# Patient Record
Sex: Male | Born: 1955
Health system: Southern US, Community
[De-identification: ages and names within clinical notes are randomized; demographics above are authoritative.]

## PROBLEM LIST (undated history)

## (undated) DIAGNOSIS — Z9109 Other allergy status, other than to drugs and biological substances: Secondary | ICD-10-CM

## (undated) DIAGNOSIS — J45909 Unspecified asthma, uncomplicated: Secondary | ICD-10-CM

## (undated) DIAGNOSIS — T7840XA Allergy, unspecified, initial encounter: Secondary | ICD-10-CM

## (undated) DIAGNOSIS — G709 Myoneural disorder, unspecified: Secondary | ICD-10-CM

## (undated) DIAGNOSIS — E785 Hyperlipidemia, unspecified: Secondary | ICD-10-CM

## (undated) DIAGNOSIS — I10 Essential (primary) hypertension: Secondary | ICD-10-CM

## (undated) DIAGNOSIS — I319 Disease of pericardium, unspecified: Secondary | ICD-10-CM

## (undated) DIAGNOSIS — I2510 Atherosclerotic heart disease of native coronary artery without angina pectoris: Secondary | ICD-10-CM

## (undated) DIAGNOSIS — M1990 Unspecified osteoarthritis, unspecified site: Secondary | ICD-10-CM

## (undated) HISTORY — DX: Allergy, unspecified, initial encounter: T78.40XA

## (undated) HISTORY — DX: Hyperlipidemia, unspecified: E78.5

## (undated) HISTORY — DX: Unspecified osteoarthritis, unspecified site: M19.90

## (undated) HISTORY — PX: POLYPECTOMY: SHX149

## (undated) HISTORY — DX: Unspecified asthma, uncomplicated: J45.909

## (undated) HISTORY — DX: Disease of pericardium, unspecified: I31.9

## (undated) HISTORY — DX: Myoneural disorder, unspecified: G70.9

## (undated) HISTORY — PX: COLONOSCOPY: SHX174

## (undated) HISTORY — DX: Other allergy status, other than to drugs and biological substances: Z91.09

---

## 1998-07-28 ENCOUNTER — Emergency Department (HOSPITAL_COMMUNITY): Admission: EM | Admit: 1998-07-28 | Discharge: 1998-07-28 | Payer: Self-pay | Admitting: Emergency Medicine

## 1999-05-27 ENCOUNTER — Encounter: Payer: Self-pay | Admitting: Family Medicine

## 1999-05-27 ENCOUNTER — Encounter: Admission: RE | Admit: 1999-05-27 | Discharge: 1999-05-27 | Payer: Self-pay | Admitting: Family Medicine

## 2000-04-18 HISTORY — PX: KNEE ARTHROSCOPY: SUR90

## 2002-05-15 ENCOUNTER — Emergency Department (HOSPITAL_COMMUNITY): Admission: EM | Admit: 2002-05-15 | Discharge: 2002-05-15 | Payer: Self-pay | Admitting: Emergency Medicine

## 2002-05-15 ENCOUNTER — Encounter: Payer: Self-pay | Admitting: Emergency Medicine

## 2003-06-07 ENCOUNTER — Observation Stay (HOSPITAL_COMMUNITY): Admission: EM | Admit: 2003-06-07 | Discharge: 2003-06-08 | Payer: Self-pay | Admitting: Emergency Medicine

## 2003-06-14 ENCOUNTER — Emergency Department (HOSPITAL_COMMUNITY): Admission: EM | Admit: 2003-06-14 | Discharge: 2003-06-14 | Payer: Self-pay | Admitting: Emergency Medicine

## 2004-02-26 ENCOUNTER — Ambulatory Visit: Payer: Self-pay | Admitting: *Deleted

## 2004-03-09 ENCOUNTER — Ambulatory Visit: Payer: Self-pay | Admitting: Psychology

## 2004-04-07 ENCOUNTER — Ambulatory Visit: Payer: Self-pay | Admitting: Psychology

## 2004-04-09 ENCOUNTER — Ambulatory Visit: Payer: Self-pay | Admitting: *Deleted

## 2004-05-19 ENCOUNTER — Ambulatory Visit: Payer: Self-pay | Admitting: Psychology

## 2004-09-30 ENCOUNTER — Ambulatory Visit: Payer: Self-pay | Admitting: *Deleted

## 2005-07-14 ENCOUNTER — Ambulatory Visit: Payer: Self-pay | Admitting: Internal Medicine

## 2006-08-28 ENCOUNTER — Ambulatory Visit: Payer: Self-pay | Admitting: Gastroenterology

## 2006-10-02 ENCOUNTER — Ambulatory Visit: Payer: Self-pay | Admitting: Gastroenterology

## 2006-10-02 ENCOUNTER — Encounter: Payer: Self-pay | Admitting: Gastroenterology

## 2008-10-22 ENCOUNTER — Emergency Department (HOSPITAL_COMMUNITY): Admission: EM | Admit: 2008-10-22 | Discharge: 2008-10-22 | Payer: Self-pay | Admitting: Emergency Medicine

## 2009-10-21 ENCOUNTER — Ambulatory Visit (HOSPITAL_BASED_OUTPATIENT_CLINIC_OR_DEPARTMENT_OTHER): Admission: RE | Admit: 2009-10-21 | Discharge: 2009-10-21 | Payer: Self-pay | Admitting: Orthopedic Surgery

## 2009-10-21 DIAGNOSIS — M795 Residual foreign body in soft tissue: Secondary | ICD-10-CM

## 2009-12-17 DIAGNOSIS — L02519 Cutaneous abscess of unspecified hand: Secondary | ICD-10-CM

## 2009-12-17 DIAGNOSIS — L03119 Cellulitis of unspecified part of limb: Secondary | ICD-10-CM

## 2009-12-30 ENCOUNTER — Encounter (INDEPENDENT_AMBULATORY_CARE_PROVIDER_SITE_OTHER): Payer: Self-pay | Admitting: *Deleted

## 2010-01-12 ENCOUNTER — Ambulatory Visit: Payer: Self-pay | Admitting: Internal Medicine

## 2010-01-13 ENCOUNTER — Encounter: Payer: Self-pay | Admitting: Internal Medicine

## 2010-05-18 NOTE — Miscellaneous (Signed)
Summary: HIPAA Restrictions  HIPAA Restrictions   Imported By: Florinda Marker 01/13/2010 11:04:40  _____________________________________________________________________  External Attachment:    Type:   Image     Comment:   External Document

## 2010-05-18 NOTE — Consult Note (Signed)
Summary: New Pt. Referral: Dr. Cindee Salt  New Pt. Referral: Dr. Cindee Salt   Imported By: Florinda Marker 01/18/2010 16:42:07  _____________________________________________________________________  External Attachment:    Type:   Image     Comment:   External Document

## 2010-05-18 NOTE — Assessment & Plan Note (Signed)
Summary: new pt cellulitis,abscess  Current Problems:   RESIDUAL FOREIGN BODY IN SOFT TISSUE (ICD-729.6)   CELLULITIS, HAND, LEFT (ICD-682.4)   * I&D, SPLINTER REMOVAL 10/21/09   * MYCOBACTERIUM AVIUM WOUND INFECTION    CC:  July had splinter in left little finger.  History of Present Illness: [name] is a 55 year old who was referred by Dr. Cindee Salt for evaluation of a left hand infection. He was working in his garden on July 5 of this year. He tried to dry final coated metal tomato stake into the ground.  Apparently it was quite rusty and snapped and a half causing him to fall forward and hit his hand on a wooden state laying on the ground.  As soon as this happened he realized that he had a splinter on the dorsal aspect of his left little finger near the knuckle.  He saw his primary care doctor that evening and was put on Augmentin.he was referred to Dr. Merlyn Lot who saw him the following morning and removed a 1.5-cm wooden splinter along with some pus under local anesthesia.  He took the Augmentin for about one more week and has healed nicely since that time.  Operative cultures eventually grew Mycobacterium avium and he was referred here.  He has not had any fever, chills, or sweats.  He is not any cough or shortness of breath.  He did develop some right lower back pain and mild shortness of breath around the time of his injury.  This was after a long car ride to see he saw his primary care doctor, Dr. Felipa Eth. Apparently a chest x-ray revealed a question of a small right sided pneumothorax and a chest CT scan was done and he tells me it was completely normal.  Preventive Screening-Counseling & Management  Alcohol-Tobacco     Alcohol drinks/day: 1     Alcohol type: wine, beer     Smoking Status: never  Caffeine-Diet-Exercise     Caffeine use/day: yes     Does Patient Exercise: yes     Type of exercise: bike ridding     Exercise (avg: min/session): weekends   Prior Medication List:   No prior medications documented  Current Allergies (reviewed today): ! MORPHINE Vital Signs:  Patient profile:   55 year old male Height:      71 inches (180.34 cm) Weight:      182.25 pounds (82.84 kg) BMI:     25.51 Temp:     98.1 degrees F (36.72 degrees C) oral Pulse rate:   67 / minute BP sitting:   137 / 87  (left arm) Cuff size:   large  Vitals Entered By: Jennet Maduro RN (January 12, 2010 1:56 PM) CC: July had splinter in left little finger Is Patient Diabetic? No Pain Assessment Patient in pain? no      Nutritional Status BMI of 25 - 29 = overweight Nutritional Status Detail appetite "good"  Have you ever been in a relationship where you felt threatened, hurt or afraid?not asked today   Does patient need assistance? Functional Status Self care Ambulation Normal   Physical Exam  General:  alert and well-nourished.   Lungs:  normal breath sounds, no crackles, and no wheezes.   Extremities:  there is a nicely healing incision at the base of his left little finger dorsally.  There is no cellulitis, drainage or other signs of active infection. Axillary Nodes:  no R axillary adenopathy and no L axillary adenopathy.  Impression & Recommendations:  Problem # 1:  CELLULITIS, HAND, LEFT (ICD-682.4) He had an acute mycobacterium avium infection of his left little finger caused by the foreign body but it has healed completely with removal of the foreign body.  I see no indication for further antimicrobial therapy at this time. I asked him to call me if he has any signs of inflammation or other concerns in the coming months. Orders: Consultation Level III (52841)  Patient Instructions: 1)  Please schedule a follow-up appointment as needed.

## 2010-05-18 NOTE — Miscellaneous (Signed)
Summary: Problems and allergies  Clinical Lists Changes Phone call to Dr. Merrilee Seashore office.  Message left requesting Susceptibilities to be ordered on the MAI culture results from 10/22/09.  Jennet Maduro RN  December 30, 2009 12:38 PM  Problems: Added new problem of CELLULITIS, HAND, LEFT (ICD-682.4) Added new problem of RESIDUAL FOREIGN BODY IN SOFT TISSUE (ICD-729.6) - left little finger Allergies: Added new allergy or adverse reaction of MORPHINE Observations: Added new observation of NKA: F (12/30/2009 12:22)

## 2010-07-04 LAB — AFB CULTURE WITH SMEAR (NOT AT ARMC): Acid Fast Smear: NONE SEEN

## 2010-07-04 LAB — WOUND CULTURE
Culture: NO GROWTH
Gram Stain: NONE SEEN

## 2010-07-04 LAB — ANAEROBIC CULTURE

## 2010-09-03 NOTE — Discharge Summary (Signed)
NAME:  Roger Wilson, Roger Wilson                             ACCOUNT NO.:  [id number]   MEDICAL RECORD NO.:  [id number]                   PATIENT TYPE:  INP   LOCATION:  3731                                 FACILITY:  [facility]   PHYSICIAN:  Guy Franco, P.A. [facility]                DATE OF BIRTH:  11/06/1955   DATE OF ADMISSION:  06/07/2003  DATE OF DISCHARGE:  06/08/2003                           DISCHARGE SUMMARY - REFERRING   DISCHARGE DIAGNOSES:  1. Syncope.  2. Hypertension.  3. History of pericarditis.  4. Bradycardia.   HISTORY OF PRESENT ILLNESS:  Roger Wilson is a 55 year old male patient who has  a history of pericarditis on two separate occasions and was treated by Dr.  Glennon Hamilton.  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  While at  [employer] yesterday he was reaching for a container and passed out.  He was  out approximately 20 seconds.  He did not lose bowel or bladder function.  No evidence of shaking by witness.   HOSPITAL COURSE:  He was brought to the emergency room for evaluation.  Cardiac isoenzymes were negative.  Head CT revealed atrophy with no acute  changes.  A chest x-ray revealed probable COPD.  He was monitored overnight.  Vital signs remained stable with a blood pressure of 108/57.   DISPOSITION:  At this point we are going to allow him to go home, and we  will plan for a treadmill Cardiolite, carotid ultrasounds, and we will check  upper extremity arterial blood flow to check for discrepancies.  At this  point, if these are negative, we will consider a tilt table and Holter  monitor.  We will have him follow up with Dr. Glennon Hamilton in one to two  weeks.  We will have the office contact him with the above appointment.   DISCHARGE MEDICATIONS:  His medications essentially are unchanged.  Apparently he was only taking Naprosyn prior to admission.                                                Guy Franco, P.A. [facility]    LB/MEDQ  D:  06/08/2003  T:  06/08/2003  Job:   [id number]

## 2010-09-03 NOTE — H&P (Signed)
NAME:  Roger Wilson, Roger Wilson                             ACCOUNT NO.:  [id number]   MEDICAL RECORD NO.:  [id number]                   PATIENT TYPE:  INP   LOCATION:  3731                                 FACILITY:  [facility]   PHYSICIAN:  Leonard Downing. Pernell Dupre, M.D.               DATE OF BIRTH:  [birth year]   DATE OF ADMISSION:  06/07/2003  DATE OF DISCHARGE:                                HISTORY & PHYSICAL   PRIMARY CARE PHYSICIAN:  Dr. Hulan Saas.   PRIMARY CARDIOLOGIST:  Adolph Pollack.   CHIEF COMPLAINT:  Passed out.   HISTORY OF PRESENT ILLNESS:  This is a 55 year old white male with a history  of syncope x1 while giving blood.  Pericarditis x2.  Presents complaining of  passing out.  The patient states he went to [place] [employer] at [employer] [place].[employer]., walking  down an aisle feeling acute light headed and nauseated, resulting in  syncope.  The patient denies chest pain, shortness of breath, no visual or  olfactory aura.  The patient reached up to grab a container and passed out.  The patient was out for about 20 seconds, no loss of bowel or bladder  function.  No shaking.  The patient states he did hit his head and cut his  left elbow.  The patient states he ate breakfast, and yogurt for lunch.  No  fevers, chills, night sweats or palpitations.  The patient's only complaint  is the back of his right hand occasionally feeling numb since ACL surgery  approximately 5 weeks ago.  No problems with bowel or bladder function.  No  blood in either.  No paroxysmal nocturnal dyspnea, orthopnea, or lower  extremity edema.  The patient went to an outside Urgent Care Center and was  sent to [facility].   ALLERGIES:  MORPHINE causes a rash, his veins turn red.   MEDICATIONS:  Naprosyn from his recent ACL surgery.   PAST MEDICAL HISTORY:  1. Syncope x1 in giving blood.  2. Pericarditis x2, viral induced, treated with NSAID.  Admitted once in the     care of  Adolph Pollack.  The first episode was 17 years ago, the second  episode was 9  years ago.  1. ACL surgery approximately 5 weeks ago on his left knee.   SOCIAL HISTORY:  He lives in [place].  He lives alone, and with a 16-year-  old daughter.  Occupation is an Lobbyist.  Does note tobacco,  occasions alcohol.  No drugs.  Exercises in regards to rehabilitation for  his ACL.  He watches his diet.  No herbal medicines.   FAMILY HISTORY:  Mother is alive at 50 with hypertension, diabetes and  obesity.  Father is alive at 46 and healthy.  Siblings - 5 brothers and  sisters healthy except for 1 brother with liver cancer.   REVIEW OF SYSTEMS:  Syncope and  numbness of the right hand occasionally.   PHYSICAL EXAMINATION:  VITAL SIGNS:  Temperature afebrile at 98, pulse 59,  respiratory rate is 20.  Blood pressure 164/102.  Saturations 100% on room  air.  GENERAL:  Alert and oriented x3, in no apparent distress.  HEENT:  Pupils are equal, round and reactive to light.  Extraocular  movements are intact.  NECK:  No carotid bruits.  JVP approximately one fourth the way up his jaw,  no lymphadenopathy.  CARDIOVASCULAR:  Normal S1, S2 with a positive S4, no murmurs, rubs, or  gallops.  LUNGS:  Clear to auscultation bilaterally.  SKIN:  No rashes or lesions.  ABDOMEN:  Soft, nontender, nondistended but does have a positive  hepatojugular reflux.  No abdominal bruits.  GU:  Not done.  RECTAL:  Deferred.  EXTREMITIES:  No clubbing or cyanosis, trace edema in his left knee.  Strong  radial and pedal pulses.  No spine or CVA tenderness.  NEUROLOGIC:  Alert and oriented x3.  Cranial nerves II-XII intact.   Chest x-ray is pending.  EKG:  Rate of 55, sinus rhythm, axis negative 7,  intervals:  PR 150, QRS 83, QTC of 427, no Q waves. J-point elevation V1 to  V3, nonspecific T wave inversions V4 through V6 and 3 in aVL.  CT of the  head is pending.   LABORATORY DATA:  CBC shows a white blood cell count of 6500, H&H 15/43,  platelet count of 209,000,  has a normal differential.  ABG:  pH 7.47, PCO2  of 37, PAO2 of 100, bicarb of 27, saturation 98% on room air.  The rest of  his labs are pending.   PLAN:  1. Syncope.  Will admit to telemetry, order the head CT without contrast     considering her fall.  Will check a chem-7, calcium, magnesium and     phosphorus.  CBC as above.  Urinalysis, urine toxicity screen, liver     panel, TSH, free T4, and chest x-ray.  Unsure of the etiology of his     syncope.  The patient has a previous history of syncope secondary to a     trigger, no obvious triggers at [facility].  2. Hypertension.  The patient states he has a history of white coat     syndrome, will see if normalizes, if does not will place on     antihypertensives.  3. Health maintenance.  Check a liver panel in the a.m.  4. He is full code.                                                Greggory Stallion L. Pernell Dupre, M.D.    Loralie Champagne  D:  06/07/2003  T:  06/08/2003  Job:  [id number]

## 2011-02-16 ENCOUNTER — Other Ambulatory Visit: Payer: Self-pay | Admitting: Dermatology

## 2011-07-19 ENCOUNTER — Other Ambulatory Visit: Payer: Self-pay | Admitting: Specialist

## 2011-07-19 DIAGNOSIS — M25572 Pain in left ankle and joints of left foot: Secondary | ICD-10-CM

## 2011-07-20 ENCOUNTER — Ambulatory Visit
Admission: RE | Admit: 2011-07-20 | Discharge: 2011-07-20 | Disposition: A | Discharge status: Home / Self Care | Payer: BC Managed Care – PPO | Source: Ambulatory Visit | Attending: Specialist | Admitting: Specialist

## 2011-07-20 DIAGNOSIS — M25572 Pain in left ankle and joints of left foot: Secondary | ICD-10-CM

## 2011-07-27 ENCOUNTER — Encounter: Payer: Self-pay | Admitting: Gastroenterology

## 2011-12-21 ENCOUNTER — Other Ambulatory Visit: Payer: Self-pay | Admitting: Internal Medicine

## 2011-12-21 ENCOUNTER — Ambulatory Visit
Admission: RE | Admit: 2011-12-21 | Discharge: 2011-12-21 | Disposition: A | Discharge status: Home / Self Care | Payer: BC Managed Care – PPO | Source: Ambulatory Visit | Attending: Internal Medicine | Admitting: Internal Medicine

## 2011-12-21 DIAGNOSIS — R10819 Abdominal tenderness, unspecified site: Secondary | ICD-10-CM

## 2011-12-21 MED ORDER — IOHEXOL 300 MG/ML  SOLN
100.0000 mL | Freq: Once | INTRAMUSCULAR | Status: AC | PRN
  Administered 2011-12-21: 100 mL via INTRAVENOUS

## 2012-01-30 ENCOUNTER — Encounter: Payer: Self-pay | Admitting: Gastroenterology

## 2012-03-12 ENCOUNTER — Ambulatory Visit (AMBULATORY_SURGERY_CENTER): Payer: BC Managed Care – PPO | Admitting: *Deleted

## 2012-03-12 VITALS — Ht 71.0 in | Wt 183.8 lb

## 2012-03-12 DIAGNOSIS — Z1211 Encounter for screening for malignant neoplasm of colon: Secondary | ICD-10-CM

## 2012-03-12 MED ORDER — MOVIPREP 100 G PO SOLR: ORAL | Status: DC

## 2012-03-30 ENCOUNTER — Encounter: Payer: Self-pay | Admitting: Gastroenterology

## 2012-03-30 ENCOUNTER — Ambulatory Visit (AMBULATORY_SURGERY_CENTER): Payer: BC Managed Care – PPO | Admitting: Gastroenterology

## 2012-03-30 VITALS — BP 131/100 | HR 48 | Temp 96.5°F | Resp 30 | Ht 71.0 in | Wt 183.0 lb

## 2012-03-30 DIAGNOSIS — D126 Benign neoplasm of colon, unspecified: Secondary | ICD-10-CM

## 2012-03-30 DIAGNOSIS — Z860100 Personal history of colon polyps, unspecified: Secondary | ICD-10-CM

## 2012-03-30 DIAGNOSIS — K5730 Diverticulosis of large intestine without perforation or abscess without bleeding: Secondary | ICD-10-CM

## 2012-03-30 DIAGNOSIS — Z1211 Encounter for screening for malignant neoplasm of colon: Secondary | ICD-10-CM

## 2012-03-30 MED ORDER — SODIUM CHLORIDE 0.9 % IV SOLN: 500.0000 mL | INTRAVENOUS | Status: DC

## 2012-03-30 NOTE — Patient Instructions (Addendum)
Discharge instruction given with verbal understanding. Handouts on polyps and diverticulosis given. Resume previous medications. YOU HAD AN ENDOSCOPIC PROCEDURE TODAY AT THE [facility]: Refer to the procedure report that was given to you for any specific questions about what was found during the examination.  If the procedure report does not answer your questions, please call your gastroenterologist to clarify.  If you requested that your care partner not be given the details of your procedure findings, then the procedure report has been included in a sealed envelope for you to review at your convenience later.  YOU SHOULD EXPECT: Some feelings of bloating in the abdomen. Passage of more gas than usual.  Walking can help get rid of the air that was put into your GI tract during the procedure and reduce the bloating. If you had a lower endoscopy (such as a colonoscopy or flexible sigmoidoscopy) you may notice spotting of blood in your stool or on the toilet paper. If you underwent a bowel prep for your procedure, then you may not have a normal bowel movement for a few days.  DIET: Your first meal following the procedure should be a light meal and then it is ok to progress to your normal diet.  A half-sandwich or bowl of soup is an example of a good first meal.  Heavy or fried foods are harder to digest and may make you feel nauseous or bloated.  Likewise meals heavy in dairy and vegetables can cause extra gas to form and this can also increase the bloating.  Drink plenty of fluids but you should avoid alcoholic beverages for 24 hours.  ACTIVITY: Your care partner should take you home directly after the procedure.  You should plan to take it easy, moving slowly for the rest of the day.  You can resume normal activity the day after the procedure however you should NOT DRIVE or use heavy machinery for 24 hours (because of the sedation medicines used during the test).    SYMPTOMS TO REPORT  IMMEDIATELY: A gastroenterologist can be reached at any hour.  During normal business hours, 8:30 AM to 5:00 PM Monday through Friday, call [phone].  After hours and on weekends, please call the GI answering service at [phone] who will take a message and have the physician on call contact you.   Following lower endoscopy (colonoscopy or flexible sigmoidoscopy):  Excessive amounts of blood in the stool  Significant tenderness or worsening of abdominal pains  Swelling of the abdomen that is new, acute  Fever of 100F or higher  FOLLOW UP: If any biopsies were taken you will be contacted by phone or by letter within the next 1-3 weeks.  Call your gastroenterologist if you have not heard about the biopsies in 3 weeks.  Our staff will call the home number listed on your records the next business day following your procedure to check on you and address any questions or concerns that you may have at that time regarding the information given to you following your procedure. This is a courtesy call and so if there is no answer at the home number and we have not heard from you through the emergency physician on call, we will assume that you have returned to your regular daily activities without incident.  SIGNATURES/CONFIDENTIALITY: You and/or your care partner have signed paperwork which will be entered into your electronic medical record.  These signatures attest to the fact that that the information above on your After Visit  Summary has been reviewed and is understood.  Full responsibility of the confidentiality of this discharge information lies with you and/or your care-partner.  

## 2012-03-30 NOTE — Progress Notes (Signed)
Patient did not experience any of the following events: a burn prior to discharge; a fall within the facility; wrong site/side/patient/procedure/implant event; or a hospital transfer or hospital admission upon discharge from the facility. (G8907) Patient did not have preoperative order for IV antibiotic SSI prophylaxis. (G8918)  

## 2012-03-30 NOTE — Op Note (Signed)
 [facility] [address] [place], [address]   COLONOSCOPY PROCEDURE REPORT  PATIENT: Roger Wilson, Roger Wilson  MR#: [id number] BIRTHDATE: 1955/12/11 , 56  yrs. old GENDER: Male ENDOSCOPIST: Rachael Fee, MD PROCEDURE DATE:  03/30/2012 PROCEDURE:   Colonoscopy with snare polypectomy ASA CLASS:   Class II INDICATIONS:small adenoma removed 2008. MEDICATIONS: Fentanyl 75 mcg IV, Versed 4 mg IV, and These medications were titrated to patient response per physician's verbal order  DESCRIPTION OF PROCEDURE:   After the risks benefits and alternatives of the procedure were thoroughly explained, informed consent was obtained.  A digital rectal exam revealed no abnormalities of the rectum.   The LB [id number]  endoscope was introduced through the anus and advanced to the cecum, which was identified by both the appendix and ileocecal valve. No adverse events experienced.   The quality of the prep was good, using MoviPrep  The instrument was then slowly withdrawn as the colon was fully examined.  COLON FINDINGS: Two small polyps were removed and sent to pathology. These were 2mm across each, both located in cecum, both removed with cold snare and sent to pathlogy.  There was mild left colon diverticulosis.  The examination was otherwise normal.  Retroflexed views revealed no abnormalities. The time to cecum=4 minutes 02 seconds.  Withdrawal time=10 minutes 48 seconds.  The scope was withdrawn and the procedure completed. COMPLICATIONS: There were no complications.  ENDOSCOPIC IMPRESSION: Two small polyps were removed and sent to pathology. There was mild left colon diverticulosis. The examination was otherwise normal.  RECOMMENDATIONS: If the polyp(s) removed today are proven to be adenomatous (pre-cancerous) polyps, you will need a repeat colonoscopy in 5 years.  Otherwise you should continue to follow colorectal cancer screening guidelines for "routine risk"  patients with colonoscopy in 10 years.  You will receive a letter within 1-2 weeks with the results of your biopsy as well as final recommendations.  Please call my office if you have not received a letter after 3 weeks.   eSigned:  Rachael Fee, MD 03/30/2012 8:58 AM

## 2012-04-02 ENCOUNTER — Telehealth: Payer: Self-pay | Admitting: *Deleted

## 2012-04-02 NOTE — Telephone Encounter (Signed)
  Follow up Call-  Call back number 03/30/2012  Post procedure Call Back phone  # [phone]  Permission to leave phone message Yes     Patient questions:  Do you have a fever, pain , or abdominal swelling? no Pain Score  0 *  Have you tolerated food without any problems? yes  Have you been able to return to your normal activities? yes  Do you have any questions about your discharge instructions: Diet   no Medications  no Follow up visit  no  Do you have questions or concerns about your Care? no  Actions: * If pain score is 4 or above: No action needed, pain <4.

## 2012-04-09 ENCOUNTER — Encounter: Payer: Self-pay | Admitting: Gastroenterology

## 2014-03-04 ENCOUNTER — Observation Stay (HOSPITAL_COMMUNITY): Payer: BC Managed Care – PPO

## 2014-03-04 ENCOUNTER — Inpatient Hospital Stay (HOSPITAL_COMMUNITY)
Admission: EM | Admit: 2014-03-04 | Discharge: 2014-03-08 | DRG: 965 | Disposition: A | Discharge status: Rehabilitation Facility | Payer: BC Managed Care – PPO | Attending: Surgery | Admitting: Surgery

## 2014-03-04 ENCOUNTER — Emergency Department (HOSPITAL_COMMUNITY): Payer: BC Managed Care – PPO

## 2014-03-04 ENCOUNTER — Encounter (HOSPITAL_COMMUNITY): Payer: Self-pay | Admitting: Emergency Medicine

## 2014-03-04 DIAGNOSIS — S32302A Unspecified fracture of left ilium, initial encounter for closed fracture: Secondary | ICD-10-CM | POA: Diagnosis present

## 2014-03-04 DIAGNOSIS — W132XXA Fall from, out of or through roof, initial encounter: Secondary | ICD-10-CM | POA: Diagnosis present

## 2014-03-04 DIAGNOSIS — S0003XA Contusion of scalp, initial encounter: Secondary | ICD-10-CM | POA: Diagnosis present

## 2014-03-04 DIAGNOSIS — S329XXA Fracture of unspecified parts of lumbosacral spine and pelvis, initial encounter for closed fracture: Secondary | ICD-10-CM

## 2014-03-04 DIAGNOSIS — I10 Essential (primary) hypertension: Secondary | ICD-10-CM | POA: Diagnosis present

## 2014-03-04 DIAGNOSIS — S42002A Fracture of unspecified part of left clavicle, initial encounter for closed fracture: Secondary | ICD-10-CM | POA: Diagnosis present

## 2014-03-04 DIAGNOSIS — S270XXA Traumatic pneumothorax, initial encounter: Secondary | ICD-10-CM | POA: Diagnosis present

## 2014-03-04 DIAGNOSIS — J939 Pneumothorax, unspecified: Secondary | ICD-10-CM

## 2014-03-04 DIAGNOSIS — S36892A Contusion of other intra-abdominal organs, initial encounter: Secondary | ICD-10-CM | POA: Diagnosis present

## 2014-03-04 DIAGNOSIS — S2232XA Fracture of one rib, left side, initial encounter for closed fracture: Principal | ICD-10-CM | POA: Diagnosis present

## 2014-03-04 DIAGNOSIS — J309 Allergic rhinitis, unspecified: Secondary | ICD-10-CM | POA: Diagnosis present

## 2014-03-04 DIAGNOSIS — R338 Other retention of urine: Secondary | ICD-10-CM | POA: Diagnosis present

## 2014-03-04 DIAGNOSIS — W19XXXA Unspecified fall, initial encounter: Secondary | ICD-10-CM

## 2014-03-04 DIAGNOSIS — Z885 Allergy status to narcotic agent status: Secondary | ICD-10-CM

## 2014-03-04 DIAGNOSIS — S3282XA Multiple fractures of pelvis without disruption of pelvic ring, initial encounter for closed fracture: Secondary | ICD-10-CM | POA: Diagnosis present

## 2014-03-04 DIAGNOSIS — S32592A Other specified fracture of left pubis, initial encounter for closed fracture: Secondary | ICD-10-CM | POA: Diagnosis present

## 2014-03-04 DIAGNOSIS — M25511 Pain in right shoulder: Secondary | ICD-10-CM | POA: Diagnosis present

## 2014-03-04 HISTORY — DX: Essential (primary) hypertension: I10

## 2014-03-04 LAB — I-STAT CHEM 8, ED
BUN: 12 mg/dL (ref 6–23)
CALCIUM ION: 1.14 mmol/L (ref 1.13–1.30)
Chloride: 102 mEq/L (ref 96–112)
Creatinine, Ser: 0.9 mg/dL (ref 0.50–1.35)
GLUCOSE: 129 mg/dL — AB (ref 70–99)
HCT: 43 % (ref 39.0–52.0)
Hemoglobin: 14.6 g/dL (ref 13.0–17.0)
Potassium: 4.1 mEq/L (ref 3.7–5.3)
Sodium: 141 mEq/L (ref 137–147)
TCO2: 28 mmol/L (ref 0–100)

## 2014-03-04 LAB — CBC WITH DIFFERENTIAL/PLATELET
Basophils Absolute: 0 10*3/uL (ref 0.0–0.1)
Basophils Relative: 0 % (ref 0–1)
EOS ABS: 0.1 10*3/uL (ref 0.0–0.7)
Eosinophils Relative: 1 % (ref 0–5)
HCT: 41 % (ref 39.0–52.0)
Hemoglobin: 14.6 g/dL (ref 13.0–17.0)
LYMPHS ABS: 0.8 10*3/uL (ref 0.7–4.0)
LYMPHS PCT: 8 % — AB (ref 12–46)
MCH: 30.6 pg (ref 26.0–34.0)
MCHC: 35.6 g/dL (ref 30.0–36.0)
MCV: 86 fL (ref 78.0–100.0)
Monocytes Absolute: 0.5 10*3/uL (ref 0.1–1.0)
Monocytes Relative: 5 % (ref 3–12)
Neutro Abs: 7.9 10*3/uL — ABNORMAL HIGH (ref 1.7–7.7)
Neutrophils Relative %: 86 % — ABNORMAL HIGH (ref 43–77)
PLATELETS: 129 10*3/uL — AB (ref 150–400)
RBC: 4.77 MIL/uL (ref 4.22–5.81)
RDW: 12.3 % (ref 11.5–15.5)
WBC: 9.3 10*3/uL (ref 4.0–10.5)

## 2014-03-04 LAB — COMPREHENSIVE METABOLIC PANEL
ALK PHOS: 44 U/L (ref 39–117)
ALT: 32 U/L (ref 0–53)
ANION GAP: 8 (ref 5–15)
AST: 41 U/L — ABNORMAL HIGH (ref 0–37)
Albumin: 3.4 g/dL — ABNORMAL LOW (ref 3.5–5.2)
BILIRUBIN TOTAL: 0.6 mg/dL (ref 0.3–1.2)
BUN: 12 mg/dL (ref 6–23)
CHLORIDE: 103 meq/L (ref 96–112)
CO2: 27 mEq/L (ref 19–32)
Calcium: 8.3 mg/dL — ABNORMAL LOW (ref 8.4–10.5)
Creatinine, Ser: 0.86 mg/dL (ref 0.50–1.35)
GFR calc non Af Amer: >90 mL/min (ref >90)
GLUCOSE: 123 mg/dL — AB (ref 70–99)
POTASSIUM: 4.2 meq/L (ref 3.7–5.3)
Sodium: 138 mEq/L (ref 137–147)
Total Protein: 6.3 g/dL (ref 6.0–8.3)

## 2014-03-04 LAB — ABO/RH: ABO/RH(D): O POS

## 2014-03-04 LAB — TYPE AND SCREEN
ABO/RH(D): O POS
Antibody Screen: NEGATIVE

## 2014-03-04 LAB — MRSA PCR SCREENING: MRSA by PCR: NEGATIVE

## 2014-03-04 MED ORDER — HYDROMORPHONE HCL 1 MG/ML IJ SOLN
1.0000 mg | Freq: Once | INTRAMUSCULAR | Status: AC
  Administered 2014-03-04: 1 mg via INTRAVENOUS
  Filled 2014-03-04: qty 1

## 2014-03-04 MED ORDER — SODIUM CHLORIDE 0.9 % IJ SOLN: 9.0000 mL | INTRAMUSCULAR | Status: DC | PRN

## 2014-03-04 MED ORDER — SODIUM CHLORIDE 0.9 % IV BOLUS (SEPSIS)
500.0000 mL | Freq: Once | INTRAVENOUS | Status: AC
  Administered 2014-03-04: 500 mL via INTRAVENOUS

## 2014-03-04 MED ORDER — IOHEXOL 300 MG/ML  SOLN: 25.0000 mL | Freq: Once | INTRAMUSCULAR | Status: AC | PRN

## 2014-03-04 MED ORDER — DIPHENHYDRAMINE HCL 12.5 MG/5ML PO ELIX
12.5000 mg | ORAL_SOLUTION | Freq: Four times a day (QID) | ORAL | Status: DC | PRN
  Filled 2014-03-04: qty 5

## 2014-03-04 MED ORDER — PANTOPRAZOLE SODIUM 40 MG PO TBEC
40.0000 mg | DELAYED_RELEASE_TABLET | Freq: Every day | ORAL | Status: DC
  Administered 2014-03-05 – 2014-03-08 (×4): 40 mg via ORAL
  Filled 2014-03-04 (×4): qty 1

## 2014-03-04 MED ORDER — ONDANSETRON HCL 4 MG/2ML IJ SOLN
4.0000 mg | Freq: Once | INTRAMUSCULAR | Status: AC
  Administered 2014-03-04: 4 mg via INTRAVENOUS
  Filled 2014-03-04: qty 2

## 2014-03-04 MED ORDER — ONDANSETRON HCL 4 MG/2ML IJ SOLN
4.0000 mg | Freq: Four times a day (QID) | INTRAMUSCULAR | Status: DC | PRN
  Administered 2014-03-04: 4 mg via INTRAVENOUS
  Filled 2014-03-04: qty 2

## 2014-03-04 MED ORDER — DIPHENHYDRAMINE HCL 50 MG/ML IJ SOLN: 12.5000 mg | Freq: Four times a day (QID) | INTRAMUSCULAR | Status: DC | PRN

## 2014-03-04 MED ORDER — HYDROMORPHONE HCL 1 MG/ML IJ SOLN
0.5000 mg | Freq: Once | INTRAMUSCULAR | Status: AC
  Administered 2014-03-04: 0.5 mg via INTRAVENOUS
  Filled 2014-03-04: qty 1

## 2014-03-04 MED ORDER — IOHEXOL 300 MG/ML  SOLN
80.0000 mL | Freq: Once | INTRAMUSCULAR | Status: AC | PRN
  Administered 2014-03-04: 80 mL via INTRAVENOUS

## 2014-03-04 MED ORDER — HYDROMORPHONE 0.3 MG/ML IV SOLN
INTRAVENOUS | Status: DC
  Administered 2014-03-04: 0.5 mg via INTRAVENOUS
  Administered 2014-03-04: 17:00:00 via INTRAVENOUS
  Administered 2014-03-05: 1.39 mg via INTRAVENOUS
  Administered 2014-03-05: 0.999 mg via INTRAVENOUS
  Administered 2014-03-05: 0.2 mg via INTRAVENOUS
  Administered 2014-03-05: 0.4 mg via INTRAVENOUS
  Administered 2014-03-05: 1 mg via INTRAVENOUS
  Administered 2014-03-05: 0.6 mg via INTRAVENOUS
  Administered 2014-03-06: 06:00:00 via INTRAVENOUS
  Administered 2014-03-06: 0.4 mg via INTRAVENOUS
  Administered 2014-03-06: 0.999 mg via INTRAVENOUS
  Administered 2014-03-06: 1.16 mg via INTRAVENOUS
  Administered 2014-03-06: 1.19 mg via INTRAVENOUS
  Administered 2014-03-06: 0.79 mg via INTRAVENOUS
  Administered 2014-03-06: 0.99 mg via INTRAVENOUS
  Administered 2014-03-07: 0.399 mg via INTRAVENOUS
  Administered 2014-03-07: 0.9 mg via INTRAVENOUS
  Administered 2014-03-07: 1.19 mg via INTRAVENOUS
  Administered 2014-03-07: 0.39 mg via INTRAVENOUS
  Filled 2014-03-04 (×2): qty 25

## 2014-03-04 MED ORDER — BISACODYL 10 MG RE SUPP: 10.0000 mg | Freq: Every day | RECTAL | Status: DC | PRN

## 2014-03-04 MED ORDER — CETYLPYRIDINIUM CHLORIDE 0.05 % MT LIQD
7.0000 mL | Freq: Two times a day (BID) | OROMUCOSAL | Status: DC
  Administered 2014-03-04 – 2014-03-05 (×2): 7 mL via OROMUCOSAL

## 2014-03-04 MED ORDER — DOCUSATE SODIUM 100 MG PO CAPS
100.0000 mg | ORAL_CAPSULE | Freq: Two times a day (BID) | ORAL | Status: DC
  Administered 2014-03-04 – 2014-03-08 (×8): 100 mg via ORAL
  Filled 2014-03-04 (×7): qty 1

## 2014-03-04 MED ORDER — HYDROMORPHONE 0.3 MG/ML IV SOLN
INTRAVENOUS | Status: AC
  Filled 2014-03-04: qty 25

## 2014-03-04 MED ORDER — INFLUENZA VAC SPLIT QUAD 0.5 ML IM SUSY
0.5000 mL | PREFILLED_SYRINGE | INTRAMUSCULAR | Status: AC
  Administered 2014-03-05: 0.5 mL via INTRAMUSCULAR
  Filled 2014-03-04: qty 0.5

## 2014-03-04 MED ORDER — PANTOPRAZOLE SODIUM 40 MG IV SOLR
40.0000 mg | Freq: Every day | INTRAVENOUS | Status: DC
  Filled 2014-03-04 (×2): qty 40

## 2014-03-04 MED ORDER — POTASSIUM CHLORIDE IN NACL 20-0.9 MEQ/L-% IV SOLN
INTRAVENOUS | Status: DC
  Administered 2014-03-04 – 2014-03-07 (×6): via INTRAVENOUS
  Filled 2014-03-04 (×8): qty 1000

## 2014-03-04 MED ORDER — NALOXONE HCL 0.4 MG/ML IJ SOLN: 0.4000 mg | INTRAMUSCULAR | Status: DC | PRN

## 2014-03-04 NOTE — ED Notes (Signed)
Per EMS, pt was on his roof and fell from the second level to the 1st 10 feet and then to the ground an additional 10 feet. Pt complaining of pain with breathing, left hip pain. Pt has hematoma to the back of his head. Alert x 4.

## 2014-03-04 NOTE — ED Notes (Signed)
Orthopedic PA speaking with Dr. Roderic Palau at this time.

## 2014-03-04 NOTE — Plan of Care (Signed)
Problem: Consults Goal: General Medical Patient Education See Patient Education Module for specific education.  Outcome: Completed/Met Date Met:  03/04/14 Goal: Skin Care Protocol Initiated - if Braden Score 18 or less If consults are not indicated, leave blank or document N/A  Outcome: Completed/Met Date Met:  03/04/14 Goal: Nutrition Consult-if indicated Outcome: Not Applicable Date Met:  01/41/03 Goal: Diabetes Guidelines if Diabetic/Glucose > 140 If diabetic or lab glucose is > 140 mg/dl - Initiate Diabetes/Hyperglycemia Guidelines & Document Interventions  Outcome: Not Applicable Date Met:  05/18/41  Problem: Phase I Progression Outcomes Goal: Pain controlled with appropriate interventions Outcome: Progressing Patient on PCA, is hesitant to use. Patient educated. Goal: Voiding-avoid urinary catheter unless indicated Outcome: Progressing Patient requested catheter secondary to injuries and movement intolerance

## 2014-03-04 NOTE — ED Notes (Signed)
Pelvic stabilized with sheet.

## 2014-03-04 NOTE — ED Notes (Addendum)
Orthopedic surgery consulted.

## 2014-03-04 NOTE — ED Notes (Signed)
Dr. Ninfa Linden removed pts C-collar.

## 2014-03-04 NOTE — ED Notes (Signed)
Dr. Darl Householder performing bedside US a this time.

## 2014-03-04 NOTE — ED Provider Notes (Signed)
CSN: [id number]     Arrival date & time 03/04/14  1219 History   First MD Initiated Contact with Patient 03/04/14 1226     Chief Complaint  Patient presents with  . Fall  . Leg Pain  . Chest Pain     (Consider location/radiation/quality/duration/timing/severity/associated sxs/prior Treatment) Patient is a 58 y.o. male presenting with fall, leg pain, and chest pain. History provided by: pt states he fell from a 2nd story roof,  but fell 5 feet to another roof then to the ground.  Fall This is a new problem. The current episode started less than 1 hour ago. The problem occurs constantly. The problem has not changed since onset.Associated symptoms include chest pain and abdominal pain. Pertinent negatives include no headaches. Exacerbated by: movement. Nothing relieves the symptoms.  Leg Pain Associated symptoms: no back pain and no fatigue   Chest Pain Associated symptoms: abdominal pain   Associated symptoms: no back pain, no cough, no fatigue and no headache     Past Medical History  Diagnosis Date  . Environmental allergies   . Hypertension    Past Surgical History  Procedure Laterality Date  . Knee arthroscopy  2002    left   Family History  Problem Relation Age of Onset  . Colon cancer Neg Hx   . Stomach cancer Neg Hx    History  Substance Use Topics  . Smoking status: Never Smoker   . Smokeless tobacco: Never Used  . Alcohol Use: 4.2 oz/week    7 Cans of beer per week    Review of Systems  Constitutional: Negative for appetite change and fatigue.  HENT: Negative for congestion, ear discharge and sinus pressure.   Eyes: Negative for discharge.  Respiratory: Negative for cough.   Cardiovascular: Positive for chest pain.  Gastrointestinal: Positive for abdominal pain. Negative for diarrhea.  Genitourinary: Negative for frequency and hematuria.  Musculoskeletal: Negative for back pain.       Left hip pain  Skin: Negative for rash.  Neurological: Negative for  seizures and headaches.  Psychiatric/Behavioral: Negative for hallucinations.      Allergies  Morphine  Home Medications   Prior to Admission medications   Medication Sig Start Date End Date Taking? Authorizing Provider  Ascorbic Acid (VITAMIN C PO) Take by mouth daily.   Yes Historical Provider, MD  aspirin 81 MG tablet Take 81 mg by mouth daily.   Yes Historical Provider, MD  Calcium Carbonate-Vitamin D (CALCIUM + D PO) Take by mouth daily.   Yes Historical Provider, MD  Cyanocobalamin (B-12 PO) Take by mouth daily.   Yes Historical Provider, MD  fish oil-omega-3 fatty acids 1000 MG capsule Take by mouth daily.   Yes Historical Provider, MD  Loratadine (CLARITIN PO) Take 10 mg by mouth daily.    Yes Historical Provider, MD  Multiple Vitamin (MULTIVITAMIN) tablet Take 1 tablet by mouth daily.   Yes Historical Provider, MD   BP 120/69 mmHg  Pulse 73  Temp(Src) 98 F (36.7 C) (Oral)  Resp 21  Ht 5\' 11"  (1.803 m)  Wt 179 lb (81.194 kg)  BMI 24.98 kg/m2  SpO2 94% Physical Exam  Constitutional: He is oriented to person, place, and time. He appears well-developed. He appears distressed.  HENT:  Head: Normocephalic.  Tender post head  Eyes: Conjunctivae and EOM are normal. No scleral icterus.  Neck: Neck supple. No thyromegaly present.  Cardiovascular: Normal rate and regular rhythm.  Exam reveals no gallop and no friction rub.  No murmur heard. Tender left and ribs  Pulmonary/Chest: No stridor. He has no wheezes. He has no rales. He exhibits no tenderness.  Abdominal: He exhibits no distension. There is tenderness. There is no rebound.  Tender left upper and lower quad  Musculoskeletal: He exhibits no edema.  Moderate tender left hip  Lymphadenopathy:    He has no cervical adenopathy.  Neurological: He is oriented to person, place, and time. He exhibits normal muscle tone. Coordination normal.  Skin: No rash noted. No erythema.  Psychiatric: He has a normal mood and  affect. His behavior is normal.    ED Course  Procedures (including critical care time) Labs Review Labs Reviewed  COMPREHENSIVE METABOLIC PANEL - Abnormal; Notable for the following:    Glucose, Bld 123 (*)    Calcium 8.3 (*)    Albumin 3.4 (*)    AST 41 (*)    All other components within normal limits  CBC WITH DIFFERENTIAL - Abnormal; Notable for the following:    Platelets 129 (*)    Neutrophils Relative % 86 (*)    Neutro Abs 7.9 (*)    Lymphocytes Relative 8 (*)    All other components within normal limits  I-STAT CHEM 8, ED - Abnormal; Notable for the following:    Glucose, Bld 129 (*)    All other components within normal limits  CBC WITH DIFFERENTIAL  TYPE AND SCREEN  ABO/RH    Imaging Review Ct Head Wo Contrast  03/04/2014   CLINICAL DATA:  [place] off of a roof.  Hit head.  EXAM: CT HEAD WITHOUT CONTRAST  CT CERVICAL SPINE WITHOUT CONTRAST  TECHNIQUE: Multidetector CT imaging of the head and cervical spine was performed following the standard protocol without intravenous contrast. Multiplanar CT image reconstructions of the cervical spine were also generated.  COMPARISON:  Head CT from 2005.  FINDINGS: CT HEAD FINDINGS  There is a sizable left high parietal scalp hematoma without underlying skull fracture. No radiopaque foreign body.  No acute intracranial findings. No subdural hematoma, infarct or intracranial hemorrhage. The brainstem and cerebellum are unremarkable. 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  CT CERVICAL SPINE FINDINGS  Normal alignment of the cervical vertebral bodies. Disc spaces and vertebral bodies are maintained. No acute fracture or abnormal prevertebral soft tissue swelling. The facets are normally aligned. The skullbase C1 and C1-2 articulations are maintained. The dens is normal. No large disc protrusions, spinal or foraminal stenosis. The lung apices are clear. A small left apical pneumothorax is noted.  IMPRESSION: 1. High left parietal scalp  hematoma without underlying skull fracture. 2. No acute intracranial findings. 3. Normal alignment of the cervical vertebral bodies and no acute cervical spine fracture. 4. Small left apical pneumothorax   Electronically Signed   By: Kalman Jewels M.D.   On: 03/04/2014 14:58   Ct Chest W Contrast  03/04/2014   CLINICAL DATA:  [place] off a roof. Pelvic fractures and left-sided pneumothorax.  EXAM: CT CHEST, ABDOMEN, AND PELVIS WITH CONTRAST  TECHNIQUE: Multidetector CT imaging of the chest, abdomen and pelvis was performed following the standard protocol during bolus administration of intravenous contrast.  CONTRAST:  62mL OMNIPAQUE IOHEXOL 300 MG/ML  SOLN  COMPARISON:  CT scan 12/21/2011.  FINDINGS: CT CHEST FINDINGS  Chest wall: There is a distal left clavicle fracture noted. The sternum is intact. No thoracic vertebral body fractures are identified. There are normally aligned. Suspect a subtle nondisplaced fracture involving the anterior aspect of the first left rib.  This is best seen on the sagittal reformatted images. On the right side there appears to be a small nondisplaced fracture near the first rib articulation with the sternum. There is an associated small substernal hematoma.  Mediastinum: The heart is normal in size. No pericardial effusion. There is a stable benign epicardial cyst on the right side. The aorta and branch vessels are patent. No dissection. No mediastinal hematoma, mass or adenopathy. The esophagus is grossly normal.  Lungs: There is a small left apical pneumothorax. Dependent bibasilar atelectasis but no pulmonary contusions. No pleural effusion.  CT ABDOMEN AND PELVIS FINDINGS  The solid abdominal organs are intact. No acute injury is identified. The gallbladder is normal. No common bowel duct dilatation. No mesenteric or retroperitoneal mass or hematoma.  The stomach, duodenum, small bowel and colon are grossly normal without oral contrast. No free air or free fluid.  The bladder,  prostate gland and seminal vesicles are normal. No pelvic mass or intrapelvic hematoma or fluid.  There is a comminuted fracture involving the left iliac wing extending all the way down to just above the superior and anterior aspect of the acetabulum. It does not involve the acetabulum itself. Both hips are normally located. The pubic symphysis and SI joints are intact. There are superior and inferior pubic rami fractures on the left side. There may be a small anterior sacral fracture on image number 104. Associated hematoma is noted in the iliacus muscle and gluteus minimus muscles.  The lumbar vertebral bodies are normally aligned. No transverse process fractures.  IMPRESSION: 1. Small left-sided pneumothorax. No pulmonary contusion or displaced rib fractures. 2. Distal left clavicle fracture and nondisplaced subtle first left rib fracture anteriorly. 3. Suspect a small fracture near the first rib articulation with the sternum with an associated small substernal hematoma. 4. Normal heart and great vessels. 5. Intact solid abdominal organs. 6. Comminuted fracture involving the left iliac wing. No involvement of the left acetabulum or left SI joint. 7. Superior and inferior pubic rami fractures on the left. The pubic symphysis is intact. 8. Moderate-sized hematoma involving the left iliacus muscle and gluteus minimus muscle.   Electronically Signed   By: Kalman Jewels M.D.   On: 03/04/2014 15:11   Ct Cervical Spine Wo Contrast  03/04/2014   CLINICAL DATA:  [place] off of a roof.  Hit head.  EXAM: CT HEAD WITHOUT CONTRAST  CT CERVICAL SPINE WITHOUT CONTRAST  TECHNIQUE: Multidetector CT imaging of the head and cervical spine was performed following the standard protocol without intravenous contrast. Multiplanar CT image reconstructions of the cervical spine were also generated.  COMPARISON:  Head CT from 2005.  FINDINGS: CT HEAD FINDINGS  There is a sizable left high parietal scalp hematoma without underlying skull  fracture. No radiopaque foreign body.  No acute intracranial findings. No subdural hematoma, infarct or intracranial hemorrhage. The brainstem and cerebellum are unremarkable. 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  CT CERVICAL SPINE FINDINGS  Normal alignment of the cervical vertebral bodies. Disc spaces and vertebral bodies are maintained. No acute fracture or abnormal prevertebral soft tissue swelling. The facets are normally aligned. The skullbase C1 and C1-2 articulations are maintained. The dens is normal. No large disc protrusions, spinal or foraminal stenosis. The lung apices are clear. A small left apical pneumothorax is noted.  IMPRESSION: 1. High left parietal scalp hematoma without underlying skull fracture. 2. No acute intracranial findings. 3. Normal alignment of the cervical vertebral bodies and no acute cervical spine fracture. 4.  Small left apical pneumothorax   Electronically Signed   By: Kalman Jewels M.D.   On: 03/04/2014 14:58   Ct Abdomen Pelvis W Contrast  03/04/2014   CLINICAL DATA:  [place] off a roof. Pelvic fractures and left-sided pneumothorax.  EXAM: CT CHEST, ABDOMEN, AND PELVIS WITH CONTRAST  TECHNIQUE: Multidetector CT imaging of the chest, abdomen and pelvis was performed following the standard protocol during bolus administration of intravenous contrast.  CONTRAST:  86mL OMNIPAQUE IOHEXOL 300 MG/ML  SOLN  COMPARISON:  CT scan 12/21/2011.  FINDINGS: CT CHEST FINDINGS  Chest wall: There is a distal left clavicle fracture noted. The sternum is intact. No thoracic vertebral body fractures are identified. There are normally aligned. Suspect a subtle nondisplaced fracture involving the anterior aspect of the first left rib. This is best seen on the sagittal reformatted images. On the right side there appears to be a small nondisplaced fracture near the first rib articulation with the sternum. There is an associated small substernal hematoma.  Mediastinum: The heart is normal in  size. No pericardial effusion. There is a stable benign epicardial cyst on the right side. The aorta and branch vessels are patent. No dissection. No mediastinal hematoma, mass or adenopathy. The esophagus is grossly normal.  Lungs: There is a small left apical pneumothorax. Dependent bibasilar atelectasis but no pulmonary contusions. No pleural effusion.  CT ABDOMEN AND PELVIS FINDINGS  The solid abdominal organs are intact. No acute injury is identified. The gallbladder is normal. No common bowel duct dilatation. No mesenteric or retroperitoneal mass or hematoma.  The stomach, duodenum, small bowel and colon are grossly normal without oral contrast. No free air or free fluid.  The bladder, prostate gland and seminal vesicles are normal. No pelvic mass or intrapelvic hematoma or fluid.  There is a comminuted fracture involving the left iliac wing extending all the way down to just above the superior and anterior aspect of the acetabulum. It does not involve the acetabulum itself. Both hips are normally located. The pubic symphysis and SI joints are intact. There are superior and inferior pubic rami fractures on the left side. There may be a small anterior sacral fracture on image number 104. Associated hematoma is noted in the iliacus muscle and gluteus minimus muscles.  The lumbar vertebral bodies are normally aligned. No transverse process fractures.  IMPRESSION: 1. Small left-sided pneumothorax. No pulmonary contusion or displaced rib fractures. 2. Distal left clavicle fracture and nondisplaced subtle first left rib fracture anteriorly. 3. Suspect a small fracture near the first rib articulation with the sternum with an associated small substernal hematoma. 4. Normal heart and great vessels. 5. Intact solid abdominal organs. 6. Comminuted fracture involving the left iliac wing. No involvement of the left acetabulum or left SI joint. 7. Superior and inferior pubic rami fractures on the left. The pubic symphysis  is intact. 8. Moderate-sized hematoma involving the left iliacus muscle and gluteus minimus muscle.   Electronically Signed   By: Kalman Jewels M.D.   On: 03/04/2014 15:11   Dg Pelvis Portable  03/04/2014   ADDENDUM REPORT: 03/04/2014 14:54  ADDENDUM: Small left apical pneumothorax is suspected.   Electronically Signed   By: Kalman Jewels M.D.   On: 03/04/2014 14:54   03/04/2014   CLINICAL DATA:  [place] off a roof.  EXAM: PORTABLE CHEST - 1 VIEW; PORTABLE PELVIS 1-2 VIEWS  COMPARISON:  06/07/2003.  FINDINGS: The cardiac silhouette, mediastinal and hilar contours are within normal limits given the AP projection  of the film and the supine position of the patient. No mediastinal widening or apical pleural cap. The lungs are grossly clear. No pleural effusion or pneumothorax. I do not see any definite rib fractures. Remote healed right clavicle fracture is noted. Could not exclude the possibility of a distal left clavicle fracture but recommend correlation with pain and tenderness in this area.  IMPRESSION: No acute cardiopulmonary findings.  Intact bony thorax. Could not exclude the possibility of a distal left clavicle fracture.  Electronically Signed: By: Kalman Jewels M.D. On: 03/04/2014 14:04   Dg Chest Port 1 View  03/04/2014   ADDENDUM REPORT: 03/04/2014 14:54  ADDENDUM: Small left apical pneumothorax is suspected.   Electronically Signed   By: Kalman Jewels M.D.   On: 03/04/2014 14:54   03/04/2014   CLINICAL DATA:  [place] off a roof.  EXAM: PORTABLE CHEST - 1 VIEW; PORTABLE PELVIS 1-2 VIEWS  COMPARISON:  06/07/2003.  FINDINGS: The cardiac silhouette, mediastinal and hilar contours are within normal limits given the AP projection of the film and the supine position of the patient. No mediastinal widening or apical pleural cap. The lungs are grossly clear. No pleural effusion or pneumothorax. I do not see any definite rib fractures. Remote healed right clavicle fracture is noted. Could not exclude  the possibility of a distal left clavicle fracture but recommend correlation with pain and tenderness in this area.  IMPRESSION: No acute cardiopulmonary findings.  Intact bony thorax. Could not exclude the possibility of a distal left clavicle fracture.  Electronically Signed: By: Kalman Jewels M.D. On: 03/04/2014 14:04   Nl fast exam by dr. Doree Fudge  EKG Interpretation   Date/Time:  Tuesday March 04 2014 12:53:08 EST Ventricular Rate:  67 PR Interval:  167 QRS Duration: 85 QT Interval:  598 QTC Calculation: 631 R Axis:   -69 Text Interpretation:  Sinus rhythm Probable left atrial enlargement LAD,  consider left anterior fascicular block Borderline T wave abnormalities  Prolonged QT interval Confirmed by Win Guajardo  MD, Lasondra Hodgkins ([id number]) on  03/04/2014 3:50:09 PM      MDM.   edcr CRITICAL CARE Performed by: Yahye Siebert L Total critical care time: 45 Critical care time was exclusive of separately billable procedures and treating other patients. Critical care was necessary to treat or prevent imminent or life-threatening deterioration. Critical care was time spent personally by me on the following activities: development of treatment plan with patient and/or surrogate as well as nursing, discussions with consultants, evaluation of patient's response to treatment, examination of patient, obtaining history from patient or surrogate, ordering and performing treatments and interventions, ordering and review of laboratory studies, ordering and review of radiographic studies, pulse oximetry and re-evaluation of patient's condition.    Final diagnoses:  Fall    Fall,  Pelvis frx,  Rib frx,   Ortho to consult and surgery to admit    Maudry Diego, MD 03/04/14 1551

## 2014-03-04 NOTE — Consult Note (Deleted)
Reason for Consult:Pelvis fracture Referring Physician: EDP  Roger Wilson is an 57 y.o. male.  HPI: [place] off roof onto left side.  Past Medical History  Diagnosis Date  . Environmental allergies   . Hypertension     Past Surgical History  Procedure Laterality Date  . Knee arthroscopy  2002    left    Family History  Problem Relation Age of Onset  . Colon cancer Neg Hx   . Stomach cancer Neg Hx     Social History:  reports that he has never smoked. He has never used smokeless tobacco. He reports that he drinks about 4.2 oz of alcohol per week. He reports that he does not use illicit drugs.  Allergies:  Allergies  Allergen Reactions  . Morphine     Veins turn red after injection    Medications: I have reviewed the patient's current medications.  Results for orders placed or performed during the hospital encounter of 03/04/14 (from the past 48 hour(s))  Comprehensive metabolic panel     Status: Abnormal   Collection Time: 03/04/14  1:16 PM  Result Value Ref Range   Sodium 138 137 - 147 mEq/L   Potassium 4.2 3.7 - 5.3 mEq/L   Chloride 103 96 - 112 mEq/L   CO2 27 19 - 32 mEq/L   Glucose, Bld 123 (H) 70 - 99 mg/dL   BUN 12 6 - 23 mg/dL   Creatinine, Ser 0.86 0.50 - 1.35 mg/dL   Calcium 8.3 (L) 8.4 - 10.5 mg/dL   Total Protein 6.3 6.0 - 8.3 g/dL   Albumin 3.4 (L) 3.5 - 5.2 g/dL   AST 41 (H) 0 - 37 U/L   ALT 32 0 - 53 U/L   Alkaline Phosphatase 44 39 - 117 U/L   Total Bilirubin 0.6 0.3 - 1.2 mg/dL   GFR calc non Af Amer >90 >90 mL/min   GFR calc Af Amer >90 >90 mL/min    Comment: (NOTE) The eGFR has been calculated using the CKD EPI equation. This calculation has not been validated in all clinical situations. eGFR's persistently <90 mL/min signify possible Chronic Kidney Disease.    Anion gap 8 5 - 15  I-stat chem 8, ed     Status: Abnormal   Collection Time: 03/04/14  1:30 PM  Result Value Ref Range   Sodium 141 137 - 147 mEq/L   Potassium 4.1 3.7 - 5.3  mEq/L   Chloride 102 96 - 112 mEq/L   BUN 12 6 - 23 mg/dL   Creatinine, Ser 0.90 0.50 - 1.35 mg/dL   Glucose, Bld 129 (H) 70 - 99 mg/dL   Calcium, Ion 1.14 1.13 - 1.30 mmol/L   TCO2 28 0 - 100 mmol/L   Hemoglobin 14.6 13.0 - 17.0 g/dL   HCT 43.0 39.0 - 52.0 %  CBC with Differential     Status: Abnormal   Collection Time: 03/04/14  1:57 PM  Result Value Ref Range   WBC 9.3 4.0 - 10.5 K/uL   RBC 4.77 4.22 - 5.81 MIL/uL   Hemoglobin 14.6 13.0 - 17.0 g/dL   HCT 41.0 39.0 - 52.0 %   MCV 86.0 78.0 - 100.0 fL   MCH 30.6 26.0 - 34.0 pg   MCHC 35.6 30.0 - 36.0 g/dL   RDW 12.3 11.5 - 15.5 %   Platelets 129 (L) 150 - 400 K/uL   Neutrophils Relative % 86 (H) 43 - 77 %   Neutro Abs 7.9 (H)  1.7 - 7.7 K/uL   Lymphocytes Relative 8 (L) 12 - 46 %   Lymphs Abs 0.8 0.7 - 4.0 K/uL   Monocytes Relative 5 3 - 12 %   Monocytes Absolute 0.5 0.1 - 1.0 K/uL   Eosinophils Relative 1 0 - 5 %   Eosinophils Absolute 0.1 0.0 - 0.7 K/uL   Basophils Relative 0 0 - 1 %   Basophils Absolute 0.0 0.0 - 0.1 K/uL  Type and screen     Status: None   Collection Time: 03/04/14  2:50 PM  Result Value Ref Range   ABO/RH(D) O POS    Antibody Screen NEG    Sample Expiration 03/07/2014   ABO/Rh     Status: None   Collection Time: 03/04/14  2:50 PM  Result Value Ref Range   ABO/RH(D) O POS     Ct Head Wo Contrast  03/04/2014   CLINICAL DATA:  [place] off of a roof.  Hit head.  EXAM: CT HEAD WITHOUT CONTRAST  CT CERVICAL SPINE WITHOUT CONTRAST  TECHNIQUE: Multidetector CT imaging of the head and cervical spine was performed following the standard protocol without intravenous contrast. Multiplanar CT image reconstructions of the cervical spine were also generated.  COMPARISON:  Head CT from 2005.  FINDINGS: CT HEAD FINDINGS  There is a sizable left high parietal scalp hematoma without underlying skull fracture. No radiopaque foreign body.  No acute intracranial findings. No subdural hematoma, infarct or intracranial  hemorrhage. The brainstem and cerebellum are unremarkable. 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  CT CERVICAL SPINE FINDINGS  Normal alignment of the cervical vertebral bodies. Disc spaces and vertebral bodies are maintained. No acute fracture or abnormal prevertebral soft tissue swelling. The facets are normally aligned. The skullbase C1 and C1-2 articulations are maintained. The dens is normal. No large disc protrusions, spinal or foraminal stenosis. The lung apices are clear. A small left apical pneumothorax is noted.  IMPRESSION: 1. High left parietal scalp hematoma without underlying skull fracture. 2. No acute intracranial findings. 3. Normal alignment of the cervical vertebral bodies and no acute cervical spine fracture. 4. Small left apical pneumothorax   Electronically Signed   By: Kalman Jewels M.D.   On: 03/04/2014 14:58   Ct Chest W Contrast  03/04/2014   CLINICAL DATA:  [place] off a roof. Pelvic fractures and left-sided pneumothorax.  EXAM: CT CHEST, ABDOMEN, AND PELVIS WITH CONTRAST  TECHNIQUE: Multidetector CT imaging of the chest, abdomen and pelvis was performed following the standard protocol during bolus administration of intravenous contrast.  CONTRAST:  66m OMNIPAQUE IOHEXOL 300 MG/ML  SOLN  COMPARISON:  CT scan 12/21/2011.  FINDINGS: CT CHEST FINDINGS  Chest wall: There is a distal left clavicle fracture noted. The sternum is intact. No thoracic vertebral body fractures are identified. There are normally aligned. Suspect a subtle nondisplaced fracture involving the anterior aspect of the first left rib. This is best seen on the sagittal reformatted images. On the right side there appears to be a small nondisplaced fracture near the first rib articulation with the sternum. There is an associated small substernal hematoma.  Mediastinum: The heart is normal in size. No pericardial effusion. There is a stable benign epicardial cyst on the right side. The aorta and branch vessels are  patent. No dissection. No mediastinal hematoma, mass or adenopathy. The esophagus is grossly normal.  Lungs: There is a small left apical pneumothorax. Dependent bibasilar atelectasis but no pulmonary contusions. No pleural effusion.  CT ABDOMEN AND  PELVIS FINDINGS  The solid abdominal organs are intact. No acute injury is identified. The gallbladder is normal. No common bowel duct dilatation. No mesenteric or retroperitoneal mass or hematoma.  The stomach, duodenum, small bowel and colon are grossly normal without oral contrast. No free air or free fluid.  The bladder, prostate gland and seminal vesicles are normal. No pelvic mass or intrapelvic hematoma or fluid.  There is a comminuted fracture involving the left iliac wing extending all the way down to just above the superior and anterior aspect of the acetabulum. It does not involve the acetabulum itself. Both hips are normally located. The pubic symphysis and SI joints are intact. There are superior and inferior pubic rami fractures on the left side. There may be a small anterior sacral fracture on image number 104. Associated hematoma is noted in the iliacus muscle and gluteus minimus muscles.  The lumbar vertebral bodies are normally aligned. No transverse process fractures.  IMPRESSION: 1. Small left-sided pneumothorax. No pulmonary contusion or displaced rib fractures. 2. Distal left clavicle fracture and nondisplaced subtle first left rib fracture anteriorly. 3. Suspect a small fracture near the first rib articulation with the sternum with an associated small substernal hematoma. 4. Normal heart and great vessels. 5. Intact solid abdominal organs. 6. Comminuted fracture involving the left iliac wing. No involvement of the left acetabulum or left SI joint. 7. Superior and inferior pubic rami fractures on the left. The pubic symphysis is intact. 8. Moderate-sized hematoma involving the left iliacus muscle and gluteus minimus muscle.   Electronically Signed    By: Kalman Jewels M.D.   On: 03/04/2014 15:11   Ct Cervical Spine Wo Contrast  03/04/2014   CLINICAL DATA:  [place] off of a roof.  Hit head.  EXAM: CT HEAD WITHOUT CONTRAST  CT CERVICAL SPINE WITHOUT CONTRAST  TECHNIQUE: Multidetector CT imaging of the head and cervical spine was performed following the standard protocol without intravenous contrast. Multiplanar CT image reconstructions of the cervical spine were also generated.  COMPARISON:  Head CT from 2005.  FINDINGS: CT HEAD FINDINGS  There is a sizable left high parietal scalp hematoma without underlying skull fracture. No radiopaque foreign body.  No acute intracranial findings. No subdural hematoma, infarct or intracranial hemorrhage. The brainstem and cerebellum are unremarkable. 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  CT CERVICAL SPINE FINDINGS  Normal alignment of the cervical vertebral bodies. Disc spaces and vertebral bodies are maintained. No acute fracture or abnormal prevertebral soft tissue swelling. The facets are normally aligned. The skullbase C1 and C1-2 articulations are maintained. The dens is normal. No large disc protrusions, spinal or foraminal stenosis. The lung apices are clear. A small left apical pneumothorax is noted.  IMPRESSION: 1. High left parietal scalp hematoma without underlying skull fracture. 2. No acute intracranial findings. 3. Normal alignment of the cervical vertebral bodies and no acute cervical spine fracture. 4. Small left apical pneumothorax   Electronically Signed   By: Kalman Jewels M.D.   On: 03/04/2014 14:58   Ct Abdomen Pelvis W Contrast  03/04/2014   CLINICAL DATA:  [place] off a roof. Pelvic fractures and left-sided pneumothorax.  EXAM: CT CHEST, ABDOMEN, AND PELVIS WITH CONTRAST  TECHNIQUE: Multidetector CT imaging of the chest, abdomen and pelvis was performed following the standard protocol during bolus administration of intravenous contrast.  CONTRAST:  23m OMNIPAQUE IOHEXOL 300 MG/ML  SOLN   COMPARISON:  CT scan 12/21/2011.  FINDINGS: CT CHEST FINDINGS  Chest wall: There is  a distal left clavicle fracture noted. The sternum is intact. No thoracic vertebral body fractures are identified. There are normally aligned. Suspect a subtle nondisplaced fracture involving the anterior aspect of the first left rib. This is best seen on the sagittal reformatted images. On the right side there appears to be a small nondisplaced fracture near the first rib articulation with the sternum. There is an associated small substernal hematoma.  Mediastinum: The heart is normal in size. No pericardial effusion. There is a stable benign epicardial cyst on the right side. The aorta and branch vessels are patent. No dissection. No mediastinal hematoma, mass or adenopathy. The esophagus is grossly normal.  Lungs: There is a small left apical pneumothorax. Dependent bibasilar atelectasis but no pulmonary contusions. No pleural effusion.  CT ABDOMEN AND PELVIS FINDINGS  The solid abdominal organs are intact. No acute injury is identified. The gallbladder is normal. No common bowel duct dilatation. No mesenteric or retroperitoneal mass or hematoma.  The stomach, duodenum, small bowel and colon are grossly normal without oral contrast. No free air or free fluid.  The bladder, prostate gland and seminal vesicles are normal. No pelvic mass or intrapelvic hematoma or fluid.  There is a comminuted fracture involving the left iliac wing extending all the way down to just above the superior and anterior aspect of the acetabulum. It does not involve the acetabulum itself. Both hips are normally located. The pubic symphysis and SI joints are intact. There are superior and inferior pubic rami fractures on the left side. There may be a small anterior sacral fracture on image number 104. Associated hematoma is noted in the iliacus muscle and gluteus minimus muscles.  The lumbar vertebral bodies are normally aligned. No transverse process  fractures.  IMPRESSION: 1. Small left-sided pneumothorax. No pulmonary contusion or displaced rib fractures. 2. Distal left clavicle fracture and nondisplaced subtle first left rib fracture anteriorly. 3. Suspect a small fracture near the first rib articulation with the sternum with an associated small substernal hematoma. 4. Normal heart and great vessels. 5. Intact solid abdominal organs. 6. Comminuted fracture involving the left iliac wing. No involvement of the left acetabulum or left SI joint. 7. Superior and inferior pubic rami fractures on the left. The pubic symphysis is intact. 8. Moderate-sized hematoma involving the left iliacus muscle and gluteus minimus muscle.   Electronically Signed   By: Kalman Jewels M.D.   On: 03/04/2014 15:11   Dg Pelvis Portable  03/04/2014   ADDENDUM REPORT: 03/04/2014 14:54  ADDENDUM: Small left apical pneumothorax is suspected.   Electronically Signed   By: Kalman Jewels M.D.   On: 03/04/2014 14:54   03/04/2014   CLINICAL DATA:  [place] off a roof.  EXAM: PORTABLE CHEST - 1 VIEW; PORTABLE PELVIS 1-2 VIEWS  COMPARISON:  06/07/2003.  FINDINGS: The cardiac silhouette, mediastinal and hilar contours are within normal limits given the AP projection of the film and the supine position of the patient. No mediastinal widening or apical pleural cap. The lungs are grossly clear. No pleural effusion or pneumothorax. I do not see any definite rib fractures. Remote healed right clavicle fracture is noted. Could not exclude the possibility of a distal left clavicle fracture but recommend correlation with pain and tenderness in this area.  IMPRESSION: No acute cardiopulmonary findings.  Intact bony thorax. Could not exclude the possibility of a distal left clavicle fracture.  Electronically Signed: By: Kalman Jewels M.D. On: 03/04/2014 14:04   Dg Chest [facility]  03/04/2014   ADDENDUM REPORT: 03/04/2014 14:54  ADDENDUM: Small left apical pneumothorax is suspected.    Electronically Signed   By: Kalman Jewels M.D.   On: 03/04/2014 14:54   03/04/2014   CLINICAL DATA:  [place] off a roof.  EXAM: PORTABLE CHEST - 1 VIEW; PORTABLE PELVIS 1-2 VIEWS  COMPARISON:  06/07/2003.  FINDINGS: The cardiac silhouette, mediastinal and hilar contours are within normal limits given the AP projection of the film and the supine position of the patient. No mediastinal widening or apical pleural cap. The lungs are grossly clear. No pleural effusion or pneumothorax. I do not see any definite rib fractures. Remote healed right clavicle fracture is noted. Could not exclude the possibility of a distal left clavicle fracture but recommend correlation with pain and tenderness in this area.  IMPRESSION: No acute cardiopulmonary findings.  Intact bony thorax. Could not exclude the possibility of a distal left clavicle fracture.  Electronically Signed: By: Kalman Jewels M.D. On: 03/04/2014 14:04    Review of Systems  Cardiovascular: Positive for chest pain.  Musculoskeletal: Positive for joint pain.   Blood pressure 120/69, pulse 73, temperature 98 F (36.7 C), temperature source Oral, resp. rate 21, height _0  (1.803 m), weight 81.194 kg (179 lb), SpO2 94 %. Physical Exam  Constitutional: He is oriented to person, place, and time. He appears well-developed.  HENT:  Head: Normocephalic.  Eyes: Pupils are equal, round, and reactive to light.  Neck: Normal range of motion.  Cardiovascular: Normal rate.   Respiratory: Effort normal.  GI: Soft.  Musculoskeletal:  Tender left distal clavicle. Abdomen soft.  Tender left iliac wing. UE/ LE Motor 5/5 sensation intact. Pulses intact. Compartments soft.  Neurological: He is alert and oriented to person, place, and time.  Skin: Skin is warm and dry.  Psychiatric: He has a normal mood and affect.    Assessment/Plan:  Close left iliac wing fracture with nondisplaced pubic rami fracture.  Left distal clavicle fracture. Should be able  to treat conservatively. Have asked Dr. Marcelino Scot to see and has ordered a 3D CT. Bedrest for now. Sling for the left shoulder and ice.   Jarome Trull C 03/04/2014, 4:05 PM

## 2014-03-04 NOTE — Progress Notes (Signed)
Orthopedic Tech Progress Note Patient Details:  Roger Wilson Jan 14, 1956 [id number]  Ortho Devices Type of Ortho Device: Arm sling Ortho Device/Splint Location: LUE Ortho Device/Splint Interventions: Ordered, Application   Braulio Bosch 03/04/2014, 10:23 PM

## 2014-03-04 NOTE — ED Notes (Signed)
Ortho Surgeon at the bedside.

## 2014-03-04 NOTE — ED Notes (Addendum)
Dr. Blackman at the bedside.  

## 2014-03-04 NOTE — ED Provider Notes (Signed)
I was requested to do FAST. Patient fell 10 feet and had L hip pain and swelling L side abdomen. FAST performed and showed no obvious free fluid. CT pending.   EMERGENCY DEPARTMENT [place] FAST EXAM  INDICATIONS:Blunt injury of abdomen  PERFORMED BY: Myself  IMAGES ARCHIVED?: Yes  FINDINGS: All views negative  LIMITATIONS:  Emergent procedure  INTERPRETATION:  No abdominal free fluid  COMMENT:  No free fluid    Wandra Arthurs, MD 03/04/14 1256

## 2014-03-04 NOTE — H&P (Signed)
History   Roger Wilson is an 58 y.o. male.   Chief Complaint:  Chief Complaint  Patient presents with  . Fall  . Leg Pain  . Chest Pain    Fall Associated symptoms include chest pain.  Leg Pain Chest Pain this is a gentleman who is status post a fall from a roof. He fell from one roof approximately 10 feet to a second roof and then 10 more feet to the ground. There was questionable loss of consciousness. He is complaining of left shoulder pain, left chest pain, and left hip pain. He denies neck pain.  There is minimal shortness of breath. He denies abdominal pain  Past Medical History  Diagnosis Date  . Environmental allergies   . Hypertension     Past Surgical History  Procedure Laterality Date  . Knee arthroscopy  2002    left    Family History  Problem Relation Age of Onset  . Colon cancer Neg Hx   . Stomach cancer Neg Hx    Social History:  reports that he has never smoked. He has never used smokeless tobacco. He reports that he drinks about 4.2 oz of alcohol per week. He reports that he does not use illicit drugs.  Allergies   Allergies  Allergen Reactions  . Morphine     Veins turn red after injection    Home Medications   (Not in a hospital admission)  Trauma Course   Results for orders placed or performed during the hospital encounter of 03/04/14 (from the past 48 hour(s))  Comprehensive metabolic panel     Status: Abnormal   Collection Time: 03/04/14  1:16 PM  Result Value Ref Range   Sodium 138 137 - 147 mEq/L   Potassium 4.2 3.7 - 5.3 mEq/L   Chloride 103 96 - 112 mEq/L   CO2 27 19 - 32 mEq/L   Glucose, Bld 123 (H) 70 - 99 mg/dL   BUN 12 6 - 23 mg/dL   Creatinine, Ser 0.86 0.50 - 1.35 mg/dL   Calcium 8.3 (L) 8.4 - 10.5 mg/dL   Total Protein 6.3 6.0 - 8.3 g/dL   Albumin 3.4 (L) 3.5 - 5.2 g/dL   AST 41 (H) 0 - 37 U/L   ALT 32 0 - 53 U/L   Alkaline Phosphatase 44 39 - 117 U/L   Total Bilirubin 0.6 0.3 - 1.2 mg/dL   GFR calc non Af Amer  >90 >90 mL/min   GFR calc Af Amer >90 >90 mL/min    Comment: (NOTE) The eGFR has been calculated using the CKD EPI equation. This calculation has not been validated in all clinical situations. eGFR's persistently <90 mL/min signify possible Chronic Kidney Disease.    Anion gap 8 5 - 15  I-stat chem 8, ed     Status: Abnormal   Collection Time: 03/04/14  1:30 PM  Result Value Ref Range   Sodium 141 137 - 147 mEq/L   Potassium 4.1 3.7 - 5.3 mEq/L   Chloride 102 96 - 112 mEq/L   BUN 12 6 - 23 mg/dL   Creatinine, Ser 0.90 0.50 - 1.35 mg/dL   Glucose, Bld 129 (H) 70 - 99 mg/dL   Calcium, Ion 1.14 1.13 - 1.30 mmol/L   TCO2 28 0 - 100 mmol/L   Hemoglobin 14.6 13.0 - 17.0 g/dL   HCT 43.0 39.0 - 52.0 %  CBC with Differential     Status: Abnormal   Collection Time: 03/04/14  1:57  PM  Result Value Ref Range   WBC 9.3 4.0 - 10.5 K/uL   RBC 4.77 4.22 - 5.81 MIL/uL   Hemoglobin 14.6 13.0 - 17.0 g/dL   HCT 41.0 39.0 - 52.0 %   MCV 86.0 78.0 - 100.0 fL   MCH 30.6 26.0 - 34.0 pg   MCHC 35.6 30.0 - 36.0 g/dL   RDW 12.3 11.5 - 15.5 %   Platelets 129 (L) 150 - 400 K/uL   Neutrophils Relative % 86 (H) 43 - 77 %   Neutro Abs 7.9 (H) 1.7 - 7.7 K/uL   Lymphocytes Relative 8 (L) 12 - 46 %   Lymphs Abs 0.8 0.7 - 4.0 K/uL   Monocytes Relative 5 3 - 12 %   Monocytes Absolute 0.5 0.1 - 1.0 K/uL   Eosinophils Relative 1 0 - 5 %   Eosinophils Absolute 0.1 0.0 - 0.7 K/uL   Basophils Relative 0 0 - 1 %   Basophils Absolute 0.0 0.0 - 0.1 K/uL   Ct Head Wo Contrast  03/04/2014   CLINICAL DATA:  [place] off of a roof.  Hit head.  EXAM: CT HEAD WITHOUT CONTRAST  CT CERVICAL SPINE WITHOUT CONTRAST  TECHNIQUE: Multidetector CT imaging of the head and cervical spine was performed following the standard protocol without intravenous contrast. Multiplanar CT image reconstructions of the cervical spine were also generated.  COMPARISON:  Head CT from 2005.  FINDINGS: CT HEAD FINDINGS  There is a sizable left high  parietal scalp hematoma without underlying skull fracture. No radiopaque foreign body.  No acute intracranial findings. No subdural hematoma, infarct or intracranial hemorrhage. The brainstem and cerebellum are unremarkable. 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  CT CERVICAL SPINE FINDINGS  Normal alignment of the cervical vertebral bodies. Disc spaces and vertebral bodies are maintained. No acute fracture or abnormal prevertebral soft tissue swelling. The facets are normally aligned. The skullbase C1 and C1-2 articulations are maintained. The dens is normal. No large disc protrusions, spinal or foraminal stenosis. The lung apices are clear. A small left apical pneumothorax is noted.  IMPRESSION: 1. High left parietal scalp hematoma without underlying skull fracture. 2. No acute intracranial findings. 3. Normal alignment of the cervical vertebral bodies and no acute cervical spine fracture. 4. Small left apical pneumothorax   Electronically Signed   By: Kalman Jewels M.D.   On: 03/04/2014 14:58   Ct Chest W Contrast  03/04/2014   CLINICAL DATA:  [place] off a roof. Pelvic fractures and left-sided pneumothorax.  EXAM: CT CHEST, ABDOMEN, AND PELVIS WITH CONTRAST  TECHNIQUE: Multidetector CT imaging of the chest, abdomen and pelvis was performed following the standard protocol during bolus administration of intravenous contrast.  CONTRAST:  21m OMNIPAQUE IOHEXOL 300 MG/ML  SOLN  COMPARISON:  CT scan 12/21/2011.  FINDINGS: CT CHEST FINDINGS  Chest wall: There is a distal left clavicle fracture noted. The sternum is intact. No thoracic vertebral body fractures are identified. There are normally aligned. Suspect a subtle nondisplaced fracture involving the anterior aspect of the first left rib. This is best seen on the sagittal reformatted images. On the right side there appears to be a small nondisplaced fracture near the first rib articulation with the sternum. There is an associated small substernal hematoma.   Mediastinum: The heart is normal in size. No pericardial effusion. There is a stable benign epicardial cyst on the right side. The aorta and branch vessels are patent. No dissection. No mediastinal hematoma, mass or adenopathy. The  esophagus is grossly normal.  Lungs: There is a small left apical pneumothorax. Dependent bibasilar atelectasis but no pulmonary contusions. No pleural effusion.  CT ABDOMEN AND PELVIS FINDINGS  The solid abdominal organs are intact. No acute injury is identified. The gallbladder is normal. No common bowel duct dilatation. No mesenteric or retroperitoneal mass or hematoma.  The stomach, duodenum, small bowel and colon are grossly normal without oral contrast. No free air or free fluid.  The bladder, prostate gland and seminal vesicles are normal. No pelvic mass or intrapelvic hematoma or fluid.  There is a comminuted fracture involving the left iliac wing extending all the way down to just above the superior and anterior aspect of the acetabulum. It does not involve the acetabulum itself. Both hips are normally located. The pubic symphysis and SI joints are intact. There are superior and inferior pubic rami fractures on the left side. There may be a small anterior sacral fracture on image number 104. Associated hematoma is noted in the iliacus muscle and gluteus minimus muscles.  The lumbar vertebral bodies are normally aligned. No transverse process fractures.  IMPRESSION: 1. Small left-sided pneumothorax. No pulmonary contusion or displaced rib fractures. 2. Distal left clavicle fracture and nondisplaced subtle first left rib fracture anteriorly. 3. Suspect a small fracture near the first rib articulation with the sternum with an associated small substernal hematoma. 4. Normal heart and great vessels. 5. Intact solid abdominal organs. 6. Comminuted fracture involving the left iliac wing. No involvement of the left acetabulum or left SI joint. 7. Superior and inferior pubic rami  fractures on the left. The pubic symphysis is intact. 8. Moderate-sized hematoma involving the left iliacus muscle and gluteus minimus muscle.   Electronically Signed   By: Kalman Jewels M.D.   On: 03/04/2014 15:11   Ct Cervical Spine Wo Contrast  03/04/2014   CLINICAL DATA:  [place] off of a roof.  Hit head.  EXAM: CT HEAD WITHOUT CONTRAST  CT CERVICAL SPINE WITHOUT CONTRAST  TECHNIQUE: Multidetector CT imaging of the head and cervical spine was performed following the standard protocol without intravenous contrast. Multiplanar CT image reconstructions of the cervical spine were also generated.  COMPARISON:  Head CT from 2005.  FINDINGS: CT HEAD FINDINGS  There is a sizable left high parietal scalp hematoma without underlying skull fracture. No radiopaque foreign body.  No acute intracranial findings. No subdural hematoma, infarct or intracranial hemorrhage. The brainstem and cerebellum are unremarkable. 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  CT CERVICAL SPINE FINDINGS  Normal alignment of the cervical vertebral bodies. Disc spaces and vertebral bodies are maintained. No acute fracture or abnormal prevertebral soft tissue swelling. The facets are normally aligned. The skullbase C1 and C1-2 articulations are maintained. The dens is normal. No large disc protrusions, spinal or foraminal stenosis. The lung apices are clear. A small left apical pneumothorax is noted.  IMPRESSION: 1. High left parietal scalp hematoma without underlying skull fracture. 2. No acute intracranial findings. 3. Normal alignment of the cervical vertebral bodies and no acute cervical spine fracture. 4. Small left apical pneumothorax   Electronically Signed   By: Kalman Jewels M.D.   On: 03/04/2014 14:58   Ct Abdomen Pelvis W Contrast  03/04/2014   CLINICAL DATA:  [place] off a roof. Pelvic fractures and left-sided pneumothorax.  EXAM: CT CHEST, ABDOMEN, AND PELVIS WITH CONTRAST  TECHNIQUE: Multidetector CT imaging of the chest,  abdomen and pelvis was performed following the standard protocol during bolus administration of intravenous  contrast.  CONTRAST:  82m OMNIPAQUE IOHEXOL 300 MG/ML  SOLN  COMPARISON:  CT scan 12/21/2011.  FINDINGS: CT CHEST FINDINGS  Chest wall: There is a distal left clavicle fracture noted. The sternum is intact. No thoracic vertebral body fractures are identified. There are normally aligned. Suspect a subtle nondisplaced fracture involving the anterior aspect of the first left rib. This is best seen on the sagittal reformatted images. On the right side there appears to be a small nondisplaced fracture near the first rib articulation with the sternum. There is an associated small substernal hematoma.  Mediastinum: The heart is normal in size. No pericardial effusion. There is a stable benign epicardial cyst on the right side. The aorta and branch vessels are patent. No dissection. No mediastinal hematoma, mass or adenopathy. The esophagus is grossly normal.  Lungs: There is a small left apical pneumothorax. Dependent bibasilar atelectasis but no pulmonary contusions. No pleural effusion.  CT ABDOMEN AND PELVIS FINDINGS  The solid abdominal organs are intact. No acute injury is identified. The gallbladder is normal. No common bowel duct dilatation. No mesenteric or retroperitoneal mass or hematoma.  The stomach, duodenum, small bowel and colon are grossly normal without oral contrast. No free air or free fluid.  The bladder, prostate gland and seminal vesicles are normal. No pelvic mass or intrapelvic hematoma or fluid.  There is a comminuted fracture involving the left iliac wing extending all the way down to just above the superior and anterior aspect of the acetabulum. It does not involve the acetabulum itself. Both hips are normally located. The pubic symphysis and SI joints are intact. There are superior and inferior pubic rami fractures on the left side. There may be a small anterior sacral fracture on image  number 104. Associated hematoma is noted in the iliacus muscle and gluteus minimus muscles.  The lumbar vertebral bodies are normally aligned. No transverse process fractures.  IMPRESSION: 1. Small left-sided pneumothorax. No pulmonary contusion or displaced rib fractures. 2. Distal left clavicle fracture and nondisplaced subtle first left rib fracture anteriorly. 3. Suspect a small fracture near the first rib articulation with the sternum with an associated small substernal hematoma. 4. Normal heart and great vessels. 5. Intact solid abdominal organs. 6. Comminuted fracture involving the left iliac wing. No involvement of the left acetabulum or left SI joint. 7. Superior and inferior pubic rami fractures on the left. The pubic symphysis is intact. 8. Moderate-sized hematoma involving the left iliacus muscle and gluteus minimus muscle.   Electronically Signed   By: MKalman JewelsM.D.   On: 03/04/2014 15:11   Dg Pelvis Portable  03/04/2014   ADDENDUM REPORT: 03/04/2014 14:54  ADDENDUM: Small left apical pneumothorax is suspected.   Electronically Signed   By: MKalman JewelsM.D.   On: 03/04/2014 14:54   03/04/2014   CLINICAL DATA:  [place] a [place].  EXAM: PORTABLE CHEST - 1 VIEW; PORTABLE PELVIS 1-2 VIEWS  COMPARISON:  06/07/2003.  FINDINGS: The cardiac silhouette, mediastinal and hilar contours are within normal limits given the AP projection of the film and the supine position of the patient. No mediastinal widening or apical pleural cap. The lungs are grossly clear. No pleural effusion or pneumothorax. I do not see any definite rib fractures. Remote healed right clavicle fracture is noted. Could not exclude the possibility of a distal left clavicle fracture but recommend correlation with pain and tenderness in this area.  IMPRESSION: No acute cardiopulmonary findings.  Intact bony thorax. Could not  exclude the possibility of a distal left clavicle fracture.  Electronically Signed: By: Kalman Jewels M.D.  On: 03/04/2014 14:04   Dg Chest Port 1 View  03/04/2014   ADDENDUM REPORT: 03/04/2014 14:54  ADDENDUM: Small left apical pneumothorax is suspected.   Electronically Signed   By: Kalman Jewels M.D.   On: 03/04/2014 14:54   03/04/2014   CLINICAL DATA:  [place] off a roof.  EXAM: PORTABLE CHEST - 1 VIEW; PORTABLE PELVIS 1-2 VIEWS  COMPARISON:  06/07/2003.  FINDINGS: The cardiac silhouette, mediastinal and hilar contours are within normal limits given the AP projection of the film and the supine position of the patient. No mediastinal widening or apical pleural cap. The lungs are grossly clear. No pleural effusion or pneumothorax. I do not see any definite rib fractures. Remote healed right clavicle fracture is noted. Could not exclude the possibility of a distal left clavicle fracture but recommend correlation with pain and tenderness in this area.  IMPRESSION: No acute cardiopulmonary findings.  Intact bony thorax. Could not exclude the possibility of a distal left clavicle fracture.  Electronically Signed: By: Kalman Jewels M.D. On: 03/04/2014 14:04    Review of Systems  Cardiovascular: Positive for chest pain.  All other systems reviewed and are negative.   Blood pressure 129/81, pulse 69, temperature 98 F (36.7 C), temperature source Oral, resp. rate 18, height _0  (1.803 m), weight 179 lb (81.194 kg), SpO2 93 %. Physical Exam  Constitutional: He is oriented to person, place, and time. He appears well-developed and well-nourished. He appears distressed.  HENT:  Head: Normocephalic.  Right Ear: External ear normal.  Left Ear: External ear normal.  Nose: Nose normal.  Mouth/Throat: Oropharynx is clear and moist. No oropharyngeal exudate.  Hematoma of the left posterior scalp  Eyes: Conjunctivae are normal. Pupils are equal, round, and reactive to light. Right eye exhibits no discharge. Left eye exhibits no discharge. No scleral icterus.  Neck: Normal range of motion. No tracheal  deviation present.  Cervical spine nontender  Cardiovascular: Normal rate, regular rhythm, normal heart sounds and intact distal pulses.   No murmur heard. Respiratory: Effort normal and breath sounds normal. No respiratory distress. He has no wheezes. He exhibits tenderness.  GI: Soft. He exhibits no distension. There is no tenderness. There is no rebound.  Musculoskeletal: Normal range of motion. He exhibits tenderness.  There is tenderness along the left iliac wing and distal left clavicle  Neurological: He is alert and oriented to person, place, and time.  Skin: Skin is warm. No erythema.  Psychiatric: His behavior is normal. Judgment normal.     Assessment/Plan Patient status post fall with multiple injuries including:  Left iliac wing fracture with left superior and inferior pubic rami fracture and retroperitoneal hematoma Left small pneumothorax Left first rib fracture Left clavicle fracture Scalp hematoma  He will be admitted to the intensive care unit for close monitoring of the pneumothorax and pelvic injuries. I will hold on chest tube insertion for now as the pneumothorax is small and asymptomatic. If it increases however he will need a chest tube. I will leave him nothing by mouth until evaluated by orthopedics.  Ayman Brull A 03/04/2014, 3:16 PM   Procedures

## 2014-03-04 NOTE — ED Notes (Signed)
Patient transported to CT 

## 2014-03-04 NOTE — Consult Note (Signed)
Reason for Consult:Pelvis fracture Referring Physician: EDP  Roger Wilson is an 57 y.o. male.  HPI: Golden [place] off roof onto left side.  Past Medical History  Diagnosis Date  . Environmental allergies   . Hypertension     Past Surgical History  Procedure Laterality Date  . Knee arthroscopy  2002    left    Family History  Problem Relation Age of Onset  . Colon cancer Neg Hx   . Stomach cancer Neg Hx     Social History:  reports that he has never smoked. He has never used smokeless tobacco. He reports that he drinks about 4.2 oz of alcohol per week. He reports that he does not use illicit drugs.  Allergies:  Allergies  Allergen Reactions  . Morphine     Veins turn red after injection    Medications: I have reviewed the patient's current medications.  Results for orders placed or performed during the hospital encounter of 03/04/14 (from the past 48 hour(s))  Comprehensive metabolic panel     Status: Abnormal   Collection Time: 03/04/14  1:16 PM  Result Value Ref Range   Sodium 138 137 - 147 mEq/L   Potassium 4.2 3.7 - 5.3 mEq/L   Chloride 103 96 - 112 mEq/L   CO2 27 19 - 32 mEq/L   Glucose, Bld 123 (H) 70 - 99 mg/dL   BUN 12 6 - 23 mg/dL   Creatinine, Ser 0.86 0.50 - 1.35 mg/dL   Calcium 8.3 (L) 8.4 - 10.5 mg/dL   Total Protein 6.3 6.0 - 8.3 g/dL   Albumin 3.4 (L) 3.5 - 5.2 g/dL   AST 41 (H) 0 - 37 U/L   ALT 32 0 - 53 U/L   Alkaline Phosphatase 44 39 - 117 U/L   Total Bilirubin 0.6 0.3 - 1.2 mg/dL   GFR calc non Af Amer >90 >90 mL/min   GFR calc Af Amer >90 >90 mL/min    Comment: (NOTE) The eGFR has been calculated using the CKD EPI equation. This calculation has not been validated in all clinical situations. eGFR's persistently <90 mL/min signify possible Chronic Kidney Disease.    Anion gap 8 5 - 15  I-stat chem 8, ed     Status: Abnormal   Collection Time: 03/04/14  1:30 PM  Result Value Ref Range   Sodium 141 137 - 147 mEq/L   Potassium 4.1 3.7 - 5.3  mEq/L   Chloride 102 96 - 112 mEq/L   BUN 12 6 - 23 mg/dL   Creatinine, Ser 0.90 0.50 - 1.35 mg/dL   Glucose, Bld 129 (H) 70 - 99 mg/dL   Calcium, Ion 1.14 1.13 - 1.30 mmol/L   TCO2 28 0 - 100 mmol/L   Hemoglobin 14.6 13.0 - 17.0 g/dL   HCT 43.0 39.0 - 52.0 %  CBC with Differential     Status: Abnormal   Collection Time: 03/04/14  1:57 PM  Result Value Ref Range   WBC 9.3 4.0 - 10.5 K/uL   RBC 4.77 4.22 - 5.81 MIL/uL   Hemoglobin 14.6 13.0 - 17.0 g/dL   HCT 41.0 39.0 - 52.0 %   MCV 86.0 78.0 - 100.0 fL   MCH 30.6 26.0 - 34.0 pg   MCHC 35.6 30.0 - 36.0 g/dL   RDW 12.3 11.5 - 15.5 %   Platelets 129 (L) 150 - 400 K/uL   Neutrophils Relative % 86 (H) 43 - 77 %   Neutro Abs 7.9 (H)  1.7 - 7.7 K/uL   Lymphocytes Relative 8 (L) 12 - 46 %   Lymphs Abs 0.8 0.7 - 4.0 K/uL   Monocytes Relative 5 3 - 12 %   Monocytes Absolute 0.5 0.1 - 1.0 K/uL   Eosinophils Relative 1 0 - 5 %   Eosinophils Absolute 0.1 0.0 - 0.7 K/uL   Basophils Relative 0 0 - 1 %   Basophils Absolute 0.0 0.0 - 0.1 K/uL  Type and screen     Status: None   Collection Time: 03/04/14  2:50 PM  Result Value Ref Range   ABO/RH(D) O POS    Antibody Screen NEG    Sample Expiration 03/07/2014   ABO/Rh     Status: None   Collection Time: 03/04/14  2:50 PM  Result Value Ref Range   ABO/RH(D) O POS   MRSA PCR Screening     Status: None   Collection Time: 03/04/14  4:55 PM  Result Value Ref Range   MRSA by PCR NEGATIVE NEGATIVE    Comment:        The GeneXpert MRSA Assay (FDA approved for NASAL specimens only), is one component of a comprehensive MRSA colonization surveillance program. It is not intended to diagnose MRSA infection nor to guide or monitor treatment for MRSA infections.     Ct Head Wo Contrast  03/04/2014   CLINICAL DATA:  Gold[place] off of a roof.  Hit head.  EXAM: CT HEAD WITHOUT CONTRAST  CT CERVICAL SPINE WITHOUT CONTRAST  TECHNIQUE: Multidetector CT imaging of the head and cervical spine was  performed following the standard protocol without intravenous contrast. Multiplanar CT image reconstructions of the cervical spine were also generated.  COMPARISON:  Head CT from 2005.  FINDINGS: CT HEAD FINDINGS  There is a sizable left high parietal scalp hematoma without underlying skull fracture. No radiopaque foreign body.  No acute intracranial findings. No subdural hematoma, infarct or intracranial hemorrhage. The brainstem and cerebellum are unremarkable. 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  CT CERVICAL SPINE FINDINGS  Normal alignment of the cervical vertebral bodies. Disc spaces and vertebral bodies are maintained. No acute fracture or abnormal prevertebral soft tissue swelling. The facets are normally aligned. The skullbase C1 and C1-2 articulations are maintained. The dens is normal. No large disc protrusions, spinal or foraminal stenosis. The lung apices are clear. A small left apical pneumothorax is noted.  IMPRESSION: 1. High left parietal scalp hematoma without underlying skull fracture. 2. No acute intracranial findings. 3. Normal alignment of the cervical vertebral bodies and no acute cervical spine fracture. 4. Small left apical pneumothorax   Electronically Signed   By: Kalman Jewels M.D.   On: 03/04/2014 14:58   Ct Chest W Contrast  03/04/2014   CLINICAL DATA:  [place] Circle off a roof. Pelvic fractures and left-sided pneumothorax.  EXAM: CT CHEST, ABDOMEN, AND PELVIS WITH CONTRAST  TECHNIQUE: Multidetector CT imaging of the chest, abdomen and pelvis was performed following the standard protocol during bolus administration of intravenous contrast.  CONTRAST:  64mL OMNIPAQUE IOHEXOL 300 MG/ML  SOLN  COMPARISON:  CT scan 12/21/2011.  FINDINGS: CT CHEST FINDINGS  Chest wall: There is a distal left clavicle fracture noted. The sternum is intact. No thoracic vertebral body fractures are identified. There are normally aligned. Suspect a subtle nondisplaced fracture involving the anterior aspect  of the first left rib. This is best seen on the sagittal reformatted images. On the right side there appears to be a small nondisplaced fracture near  the first rib articulation with the sternum. There is an associated small substernal hematoma.  Mediastinum: The heart is normal in size. No pericardial effusion. There is a stable benign epicardial cyst on the right side. The aorta and branch vessels are patent. No dissection. No mediastinal hematoma, mass or adenopathy. The esophagus is grossly normal.  Lungs: There is a small left apical pneumothorax. Dependent bibasilar atelectasis but no pulmonary contusions. No pleural effusion.  CT ABDOMEN AND PELVIS FINDINGS  The solid abdominal organs are intact. No acute injury is identified. The gallbladder is normal. No common bowel duct dilatation. No mesenteric or retroperitoneal mass or hematoma.  The stomach, duodenum, small bowel and colon are grossly normal without oral contrast. No free air or free fluid.  The bladder, prostate gland and seminal vesicles are normal. No pelvic mass or intrapelvic hematoma or fluid.  There is a comminuted fracture involving the left iliac wing extending all the way down to just above the superior and anterior aspect of the acetabulum. It does not involve the acetabulum itself. Both hips are normally located. The pubic symphysis and SI joints are intact. There are superior and inferior pubic rami fractures on the left side. There may be a small anterior sacral fracture on image number 104. Associated hematoma is noted in the iliacus muscle and gluteus minimus muscles.  The lumbar vertebral bodies are normally aligned. No transverse process fractures.  IMPRESSION: 1. Small left-sided pneumothorax. No pulmonary contusion or displaced rib fractures. 2. Distal left clavicle fracture and nondisplaced subtle first left rib fracture anteriorly. 3. Suspect a small fracture near the first rib articulation with the sternum with an associated  small substernal hematoma. 4. Normal heart and great vessels. 5. Intact solid abdominal organs. 6. Comminuted fracture involving the left iliac wing. No involvement of the left acetabulum or left SI joint. 7. Superior and inferior pubic rami fractures on the left. The pubic symphysis is intact. 8. Moderate-sized hematoma involving the left iliacus muscle and gluteus minimus muscle.   Electronically Signed   By: Kalman Jewels M.D.   On: 03/04/2014 15:11   Ct Cervical Spine Wo Contrast  03/04/2014   CLINICAL DATA:  [place] off of a roof.  Hit head.  EXAM: CT HEAD WITHOUT CONTRAST  CT CERVICAL SPINE WITHOUT CONTRAST  TECHNIQUE: Multidetector CT imaging of the head and cervical spine was performed following the standard protocol without intravenous contrast. Multiplanar CT image reconstructions of the cervical spine were also generated.  COMPARISON:  Head CT from 2005.  FINDINGS: CT HEAD FINDINGS  There is a sizable left high parietal scalp hematoma without underlying skull fracture. No radiopaque foreign body.  No acute intracranial findings. No subdural hematoma, infarct or intracranial hemorrhage. The brainstem and cerebellum are unremarkable. 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  CT CERVICAL SPINE FINDINGS  Normal alignment of the cervical vertebral bodies. Disc spaces and vertebral bodies are maintained. No acute fracture or abnormal prevertebral soft tissue swelling. The facets are normally aligned. The skullbase C1 and C1-2 articulations are maintained. The dens is normal. No large disc protrusions, spinal or foraminal stenosis. The lung apices are clear. A small left apical pneumothorax is noted.  IMPRESSION: 1. High left parietal scalp hematoma without underlying skull fracture. 2. No acute intracranial findings. 3. Normal alignment of the cervical vertebral bodies and no acute cervical spine fracture. 4. Small left apical pneumothorax   Electronically Signed   By: Kalman Jewels M.D.   On:  03/04/2014 14:58  Ct Abdomen Pelvis W Contrast  03/04/2014   CLINICAL DATA:  [place] off a roof. Pelvic fractures and left-sided pneumothorax.  EXAM: CT CHEST, ABDOMEN, AND PELVIS WITH CONTRAST  TECHNIQUE: Multidetector CT imaging of the chest, abdomen and pelvis was performed following the standard protocol during bolus administration of intravenous contrast.  CONTRAST:  33mL OMNIPAQUE IOHEXOL 300 MG/ML  SOLN  COMPARISON:  CT scan 12/21/2011.  FINDINGS: CT CHEST FINDINGS  Chest wall: There is a distal left clavicle fracture noted. The sternum is intact. No thoracic vertebral body fractures are identified. There are normally aligned. Suspect a subtle nondisplaced fracture involving the anterior aspect of the first left rib. This is best seen on the sagittal reformatted images. On the right side there appears to be a small nondisplaced fracture near the first rib articulation with the sternum. There is an associated small substernal hematoma.  Mediastinum: The heart is normal in size. No pericardial effusion. There is a stable benign epicardial cyst on the right side. The aorta and branch vessels are patent. No dissection. No mediastinal hematoma, mass or adenopathy. The esophagus is grossly normal.  Lungs: There is a small left apical pneumothorax. Dependent bibasilar atelectasis but no pulmonary contusions. No pleural effusion.  CT ABDOMEN AND PELVIS FINDINGS  The solid abdominal organs are intact. No acute injury is identified. The gallbladder is normal. No common bowel duct dilatation. No mesenteric or retroperitoneal mass or hematoma.  The stomach, duodenum, small bowel and colon are grossly normal without oral contrast. No free air or free fluid.  The bladder, prostate gland and seminal vesicles are normal. No pelvic mass or intrapelvic hematoma or fluid.  There is a comminuted fracture involving the left iliac wing extending all the way down to just above the superior and anterior aspect of the acetabulum.  It does not involve the acetabulum itself. Both hips are normally located. The pubic symphysis and SI joints are intact. There are superior and inferior pubic rami fractures on the left side. There may be a small anterior sacral fracture on image number 104. Associated hematoma is noted in the iliacus muscle and gluteus minimus muscles.  The lumbar vertebral bodies are normally aligned. No transverse process fractures.  IMPRESSION: 1. Small left-sided pneumothorax. No pulmonary contusion or displaced rib fractures. 2. Distal left clavicle fracture and nondisplaced subtle first left rib fracture anteriorly. 3. Suspect a small fracture near the first rib articulation with the sternum with an associated small substernal hematoma. 4. Normal heart and great vessels. 5. Intact solid abdominal organs. 6. Comminuted fracture involving the left iliac wing. No involvement of the left acetabulum or left SI joint. 7. Superior and inferior pubic rami fractures on the left. The pubic symphysis is intact. 8. Moderate-sized hematoma involving the left iliacus muscle and gluteus minimus muscle.   Electronically Signed   By: Kalman Jewels M.D.   On: 03/04/2014 15:11   Dg Pelvis Portable  03/04/2014   ADDENDUM REPORT: 03/04/2014 14:54  ADDENDUM: Small left apical pneumothorax is suspected.   Electronically Signed   By: Kalman Jewels M.D.   On: 03/04/2014 14:54   03/04/2014   CLINICAL DATA:  Gold[place] Circle off a roof.  EXAM: PORTABLE CHEST - 1 VIEW; PORTABLE PELVIS 1-2 VIEWS  COMPARISON:  06/07/2003.  FINDINGS: The cardiac silhouette, mediastinal and hilar contours are within normal limits given the AP projection of the film and the supine position of the patient. No mediastinal widening or apical pleural cap. The lungs are grossly clear. No  pleural effusion or pneumothorax. I do not see any definite rib fractures. Remote healed right clavicle fracture is noted. Could not exclude the possibility of a distal left clavicle fracture but  recommend correlation with pain and tenderness in this area.  IMPRESSION: No acute cardiopulmonary findings.  Intact bony thorax. Could not exclude the possibility of a distal left clavicle fracture.  Electronically Signed: By: Loralie Champagne M.D. On: 03/04/2014 14:04   Dg Hand 2 View Right  03/04/2014   CLINICAL DATA:  Pt fell off of a ladder today and has pain in the 2nd digit in the right hand that travels through the hand  EXAM: RIGHT HAND - 2 VIEW  COMPARISON:  None.  FINDINGS: No acute fracture.  No dislocation.  There is a well corticated bone fragment medial to the PIP joint of the fourth finger. This has a chronic appearance.  There are minor osteoarthritic changes with small marginal osteophytes and slight asymmetric joint space narrowing of several of the interphalangeal joints. Mild arthropathic changes are noted of the second and third metacarpophalangeal joints.  Soft tissues are unremarkable.  No radiopaque foreign body.  IMPRESSION: No fracture or dislocation or acute finding.   Electronically Signed   By: Amie Portland M.D.   On: 03/04/2014 16:35   Ct 3d Independent Annabell Sabal  03/04/2014   CLINICAL DATA:  Nonspecific (abnormal) findings on radiological and other examination of musculoskeletal sysem. Patient fell off roof, pelvic fractures  EXAM: 3-DIMENSIONAL CT IMAGE RENDERING ON INDEPENDENT WORKSTATION  TECHNIQUE: 3-dimensional CT images were rendered by post-processing of the original CT data on an independent workstation. The 3-dimensional CT images were interpreted and findings were reported in the accompanying complete CT report for this study  COMPARISON:  CT chest abdomen and pelvis 03/04/2014  FINDINGS: Comminuted mildly displaced fracture involving the LEFT iliac wing, with fracture planes coursing superior and anterior to the acetabulum without discrete acetabular involvement.  An additional oblique fracture plane extends through superior LEFT iliac wing into LEFT SI joint.   Nondisplaced fracture LEFT superior pubic ramus.  Minimally displaced fracture inferior LEFT pubic ramus.  Hips normally located bilaterally.  SI joints appear symmetric.  Subtle anterior LEFT sacral fracture on axial images is not definitely visualized on the 3D reconstructions.  RIGHT pelvis appears intact.  L5 vertebra appears intact and normally aligned.  IMPRESSION: Multiple fracture planes involving the LEFT iliac wing remaining external to the LEFT acetabulum but extending in a nondisplaced fracture plane into the LEFT SI joint.  Fractures of LEFT superior and inferior pubic rami.   Electronically Signed   By: Ulyses Southward M.D.   On: 03/04/2014 18:58   Dg Chest Port 1 View  03/04/2014   ADDENDUM REPORT: 03/04/2014 14:54  ADDENDUM: Small left apical pneumothorax is suspected.   Electronically Signed   By: Loralie Champagne M.D.   On: 03/04/2014 14:54   03/04/2014   CLINICAL DATA:  Larey Seat off a roof.  EXAM: PORTABLE CHEST - 1 VIEW; PORTABLE PELVIS 1-2 VIEWS  COMPARISON:  06/07/2003.  FINDINGS: The cardiac silhouette, mediastinal and hilar contours are within normal limits given the AP projection of the film and the supine position of the patient. No mediastinal widening or apical pleural cap. The lungs are grossly clear. No pleural effusion or pneumothorax. I do not see any definite rib fractures. Remote healed right clavicle fracture is noted. Could not exclude the possibility of a distal left clavicle fracture but recommend correlation with pain and tenderness in this  area.  IMPRESSION: No acute cardiopulmonary findings.  Intact bony thorax. Could not exclude the possibility of a distal left clavicle fracture.  Electronically Signed: By: Kalman Jewels M.D. On: 03/04/2014 14:04    Review of Systems  Cardiovascular: Positive for chest pain.  Musculoskeletal: Positive for joint pain.   Blood pressure 141/81, pulse 69, temperature 98 F (36.7 C), temperature source Oral, resp. rate 17, height $RemoveBe'5\' 11"'[id number]$   (1.803 m), weight 82.4 kg (181 lb 10.5 oz), SpO2 96 %. Physical Exam  Constitutional: He is oriented to person, place, and time. He appears well-developed.  HENT:  Head: Normocephalic.  Eyes: Pupils are equal, round, and reactive to light.  Neck: Normal range of motion.  Cardiovascular: Normal rate.   Respiratory: Effort normal.  GI: Soft.  Musculoskeletal:  Tender left distal clavicle. Abdomen soft.  Tender left iliac wing. UE/ LE Motor 5/5 sensation intact. Pulses intact. Compartments soft.  Neurological: He is alert and oriented to person, place, and time.  Skin: Skin is warm and dry.  Psychiatric: He has a normal mood and affect.    Assessment/Plan:  Closed left Iliac wing fracture with Inf and superior pubic ramus fractures. SI joint intact. Left distal clavicle fracture. Multiple trauma. Pneumothorax. Rib fracture.  May be able to treat this pelvis fracture conservatively. Bed rest for now. Discussed with Dr. Marcelino Scot who ordered a 3D CT scan and will se tomorrow. Sling for distal clavicle. Bed rest for now.   Anias Bartol C 03/04/2014, 7:04 PM

## 2014-03-05 ENCOUNTER — Observation Stay (HOSPITAL_COMMUNITY): Payer: BC Managed Care – PPO

## 2014-03-05 DIAGNOSIS — M25511 Pain in right shoulder: Secondary | ICD-10-CM | POA: Diagnosis present

## 2014-03-05 DIAGNOSIS — J309 Allergic rhinitis, unspecified: Secondary | ICD-10-CM | POA: Diagnosis present

## 2014-03-05 DIAGNOSIS — S36892A Contusion of other intra-abdominal organs, initial encounter: Secondary | ICD-10-CM | POA: Diagnosis present

## 2014-03-05 DIAGNOSIS — S2232XA Fracture of one rib, left side, initial encounter for closed fracture: Secondary | ICD-10-CM | POA: Diagnosis present

## 2014-03-05 DIAGNOSIS — Z885 Allergy status to narcotic agent status: Secondary | ICD-10-CM | POA: Diagnosis not present

## 2014-03-05 DIAGNOSIS — S42002A Fracture of unspecified part of left clavicle, initial encounter for closed fracture: Secondary | ICD-10-CM | POA: Diagnosis present

## 2014-03-05 DIAGNOSIS — I10 Essential (primary) hypertension: Secondary | ICD-10-CM | POA: Diagnosis present

## 2014-03-05 DIAGNOSIS — S32592A Other specified fracture of left pubis, initial encounter for closed fracture: Secondary | ICD-10-CM | POA: Diagnosis present

## 2014-03-05 DIAGNOSIS — S270XXA Traumatic pneumothorax, initial encounter: Secondary | ICD-10-CM | POA: Diagnosis present

## 2014-03-05 DIAGNOSIS — S3282XA Multiple fractures of pelvis without disruption of pelvic ring, initial encounter for closed fracture: Secondary | ICD-10-CM | POA: Diagnosis present

## 2014-03-05 DIAGNOSIS — R338 Other retention of urine: Secondary | ICD-10-CM | POA: Diagnosis present

## 2014-03-05 DIAGNOSIS — S0003XA Contusion of scalp, initial encounter: Secondary | ICD-10-CM | POA: Diagnosis present

## 2014-03-05 DIAGNOSIS — W132XXA Fall from, out of or through roof, initial encounter: Secondary | ICD-10-CM | POA: Diagnosis present

## 2014-03-05 DIAGNOSIS — S32302A Unspecified fracture of left ilium, initial encounter for closed fracture: Secondary | ICD-10-CM | POA: Diagnosis present

## 2014-03-05 LAB — CBC
HCT: 39.1 % (ref 39.0–52.0)
HEMOGLOBIN: 14.3 g/dL (ref 13.0–17.0)
MCH: 31.4 pg (ref 26.0–34.0)
MCHC: 36.6 g/dL — AB (ref 30.0–36.0)
MCV: 85.9 fL (ref 78.0–100.0)
PLATELETS: 123 10*3/uL — AB (ref 150–400)
RBC: 4.55 MIL/uL (ref 4.22–5.81)
RDW: 12.6 % (ref 11.5–15.5)
WBC: 8.4 10*3/uL (ref 4.0–10.5)

## 2014-03-05 LAB — BASIC METABOLIC PANEL
ANION GAP: 10 (ref 5–15)
BUN: 10 mg/dL (ref 6–23)
CHLORIDE: 100 meq/L (ref 96–112)
CO2: 24 mEq/L (ref 19–32)
Calcium: 8 mg/dL — ABNORMAL LOW (ref 8.4–10.5)
Creatinine, Ser: 0.8 mg/dL (ref 0.50–1.35)
Glucose, Bld: 113 mg/dL — ABNORMAL HIGH (ref 70–99)
Potassium: 4.1 mEq/L (ref 3.7–5.3)
Sodium: 134 mEq/L — ABNORMAL LOW (ref 137–147)

## 2014-03-05 LAB — BLOOD PRODUCT ORDER (VERBAL) VERIFICATION

## 2014-03-05 MED ORDER — LORATADINE 10 MG PO TABS
10.0000 mg | ORAL_TABLET | Freq: Every day | ORAL | Status: DC
  Administered 2014-03-05 – 2014-03-08 (×3): 10 mg via ORAL
  Filled 2014-03-05 (×4): qty 1

## 2014-03-05 NOTE — Clinical Social Work Note (Signed)
Clinical Social Work Department BRIEF PSYCHOSOCIAL ASSESSMENT 03/05/2014  Patient:  Roger Wilson, Roger Wilson     Account Number:  [id number]     Admit date:  03/04/2014  Clinical Social Worker:  Myles Lipps  Date/Time:  03/05/2014 03:00 PM  Referred by:  Physician  Date Referred:  03/05/2014 Referred for  Psychosocial assessment   Roger Wilson Referral:   Interview type:  Patient Roger Wilson interview type:   Patient wife at bedside    PSYCHOSOCIAL DATA Living Status:  WIFE Admitted from facility:   Level of care:   Primary support name:  Contrell Ballentine [phone] Primary support relationship to patient:  SPOUSE Degree of support available:   Strong    CURRENT CONCERNS Current Concerns  None Noted   Roger Wilson Concerns:    SOCIAL WORK ASSESSMENT / PLAN Clinical Social Worker met with patient and patient spouse to offer support and discuss patient needs at discharge. Patient states that he was cleaing the gutters when he fell almost two stories.  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 Patient lives at home with his wife and plans to return home with her at discharge.  Patient wife works during the day but states that she will likely be able to work from home most days.    Clinical Social Worker inquired about current substance use.  Patient states that he has no concerns regarding his current use.  Patient with limited alcohol intake weekly. No concerns at this time.  SBIRT complete.  CSW signing off at this time.  Please reconsult if further needs arise prior to discharge.   Assessment/plan status:  No Further Intervention Required Roger Wilson assessment/ plan:   Information/referral to community resources:   SBIRT completed.  CSW offered patient resources and potential need for work documentation.  Patient explains that he is self employed and no documentation is necessary.    PATIENT'S/FAMILY'S RESPONSE TO PLAN OF CARE: Patient alert and  oriented x3 laying in bed.  Per MD note, pelvic fracture will be treated conservatively at this time.  Patient with good family support.  Patient and wife understanding of social work role and appreciative of support and concern.

## 2014-03-05 NOTE — Progress Notes (Signed)
Subjective: L iliac wing fx L distal clavicle fx  Pt s/p fall from 2 stories yesterday, admitted to trauma service with PTX, multiple rib fx, fx noted above. He could not get into the sling due to pain with motion of his left arm. Does note increased pain overall today.    Objective: Vital signs in last 24 hours: Temp:  [98 F (36.7 C)-98.7 F (37.1 C)] 98.6 F (37 C) (11/18 0754) Pulse Rate:  [59-74] 64 (11/18 0700) Resp:  [15-23] 20 (11/18 0700) BP: (110-178)/(64-92) 131/77 mmHg (11/18 0700) SpO2:  [91 %-98 %] 93 % (11/18 0700) Weight:  [81.194 kg (179 lb)-82.4 kg (181 lb 10.5 oz)] 82.4 kg (181 lb 10.5 oz) (11/17 1630)  Intake/Output from previous day: 11/17 0701 - 11/18 0700 In: 2665 [P.O.:740; I.V.:1925] Out: 1175 [Urine:1175] Intake/Output this shift:     Recent Labs  03/04/14 1330 03/04/14 1357 03/05/14 0240  HGB 14.6 14.6 14.3    Recent Labs  03/04/14 1357 03/05/14 0240  WBC 9.3 8.4  RBC 4.77 4.55  HCT 41.0 39.1  PLT 129* 123*    Recent Labs  03/04/14 1316 03/04/14 1330 03/05/14 0240  NA 138 141 134*  K 4.2 4.1 4.1  CL 103 102 100  CO2 27  --  24  BUN 12 12 10   CREATININE 0.86 0.90 0.80  GLUCOSE 123* 129* 113*  CALCIUM 8.3*  --  8.0*   No results for input(s): LABPT, INR in the last 72 hours.  Neurologically intact ABD soft Neurovascular intact Sensation intact distally Intact pulses distally Dorsiflexion/Plantar flexion intact No cellulitis present Compartment soft no calf pain or sign of DVT  Assessment/Plan: L iliac wing fx- awaiting 3D CT and eval by Dr. Marcelino Scot to determine if ORIF is necessary vs. Conservative tx L distal clavicle fx- sling, conservative tx Other tx per admitting service (trauma) Discussed with Dr. Mliss Fritz, Conley Rolls. 03/05/2014, 8:29 AM

## 2014-03-05 NOTE — Progress Notes (Signed)
Trauma Service Note  Subjective: Patient is still in bed.  No acute distress.  Has a pelvic binder in place for comfort.  Objective: Vital signs in last 24 hours: Temp:  [98 F (36.7 C)-98.7 F (37.1 C)] 98.6 F (37 C) (11/18 0754) Pulse Rate:  [59-74] 64 (11/18 0700) Resp:  [15-23] 20 (11/18 0700) BP: (110-178)/(64-92) 131/77 mmHg (11/18 0700) SpO2:  [91 %-98 %] 93 % (11/18 0700) Weight:  [81.194 kg (179 lb)-82.4 kg (181 lb 10.5 oz)] 82.4 kg (181 lb 10.5 oz) (11/17 1630) Last BM Date: 03/04/14  Intake/Output from previous day: 11/17 0701 - 11/18 0700 In: 2665 [P.O.:740; I.V.:1925] Out: 1175 [Urine:1175] Intake/Output this shift:    General: No acute distress  Lungs: Clear.  Sats are good.  Abd: Benign  Extremities: No obvious deformities  Neuro: Intact  Lab Results: CBC   Recent Labs  03/04/14 1357 03/05/14 0240  WBC 9.3 8.4  HGB 14.6 14.3  HCT 41.0 39.1  PLT 129* 123*   BMET  Recent Labs  03/04/14 1316 03/04/14 1330 03/05/14 0240  NA 138 141 134*  K 4.2 4.1 4.1  CL 103 102 100  CO2 27  --  24  GLUCOSE 123* 129* 113*  BUN 12 12 10   CREATININE 0.86 0.90 0.80  CALCIUM 8.3*  --  8.0*   PT/INR No results for input(s): LABPROT, INR in the last 72 hours. ABG No results for input(s): PHART, HCO3 in the last 72 hours.  Invalid input(s): PCO2, PO2  Studies/Results: Ct Head Wo Contrast  03/04/2014   CLINICAL DATA:  [place] off of a roof.  Hit head.  EXAM: CT HEAD WITHOUT CONTRAST  CT CERVICAL SPINE WITHOUT CONTRAST  TECHNIQUE: Multidetector CT imaging of the head and cervical spine was performed following the standard protocol without intravenous contrast. Multiplanar CT image reconstructions of the cervical spine were also generated.  COMPARISON:  Head CT from 2005.  FINDINGS: CT HEAD FINDINGS  There is a sizable left high parietal scalp hematoma without underlying skull fracture. No radiopaque foreign body.  No acute intracranial findings. No subdural  hematoma, infarct or intracranial hemorrhage. The brainstem and cerebellum are unremarkable. 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  CT CERVICAL SPINE FINDINGS  Normal alignment of the cervical vertebral bodies. Disc spaces and vertebral bodies are maintained. No acute fracture or abnormal prevertebral soft tissue swelling. The facets are normally aligned. The skullbase C1 and C1-2 articulations are maintained. The dens is normal. No large disc protrusions, spinal or foraminal stenosis. The lung apices are clear. A small left apical pneumothorax is noted.  IMPRESSION: 1. High left parietal scalp hematoma without underlying skull fracture. 2. No acute intracranial findings. 3. Normal alignment of the cervical vertebral bodies and no acute cervical spine fracture. 4. Small left apical pneumothorax   Electronically Signed   By: Kalman Jewels M.D.   On: 03/04/2014 14:58   Ct Chest W Contrast  03/04/2014   CLINICAL DATA:  [place] off a roof. Pelvic fractures and left-sided pneumothorax.  EXAM: CT CHEST, ABDOMEN, AND PELVIS WITH CONTRAST  TECHNIQUE: Multidetector CT imaging of the chest, abdomen and pelvis was performed following the standard protocol during bolus administration of intravenous contrast.  CONTRAST:  53mL OMNIPAQUE IOHEXOL 300 MG/ML  SOLN  COMPARISON:  CT scan 12/21/2011.  FINDINGS: CT CHEST FINDINGS  Chest wall: There is a distal left clavicle fracture noted. The sternum is intact. No thoracic vertebral body fractures are identified. There are normally aligned. Suspect a subtle  nondisplaced fracture involving the anterior aspect of the first left rib. This is best seen on the sagittal reformatted images. On the right side there appears to be a small nondisplaced fracture near the first rib articulation with the sternum. There is an associated small substernal hematoma.  Mediastinum: The heart is normal in size. No pericardial effusion. There is a stable benign epicardial cyst on the right side. The  aorta and branch vessels are patent. No dissection. No mediastinal hematoma, mass or adenopathy. The esophagus is grossly normal.  Lungs: There is a small left apical pneumothorax. Dependent bibasilar atelectasis but no pulmonary contusions. No pleural effusion.  CT ABDOMEN AND PELVIS FINDINGS  The solid abdominal organs are intact. No acute injury is identified. The gallbladder is normal. No common bowel duct dilatation. No mesenteric or retroperitoneal mass or hematoma.  The stomach, duodenum, small bowel and colon are grossly normal without oral contrast. No free air or free fluid.  The bladder, prostate gland and seminal vesicles are normal. No pelvic mass or intrapelvic hematoma or fluid.  There is a comminuted fracture involving the left iliac wing extending all the way down to just above the superior and anterior aspect of the acetabulum. It does not involve the acetabulum itself. Both hips are normally located. The pubic symphysis and SI joints are intact. There are superior and inferior pubic rami fractures on the left side. There may be a small anterior sacral fracture on image number 104. Associated hematoma is noted in the iliacus muscle and gluteus minimus muscles.  The lumbar vertebral bodies are normally aligned. No transverse process fractures.  IMPRESSION: 1. Small left-sided pneumothorax. No pulmonary contusion or displaced rib fractures. 2. Distal left clavicle fracture and nondisplaced subtle first left rib fracture anteriorly. 3. Suspect a small fracture near the first rib articulation with the sternum with an associated small substernal hematoma. 4. Normal heart and great vessels. 5. Intact solid abdominal organs. 6. Comminuted fracture involving the left iliac wing. No involvement of the left acetabulum or left SI joint. 7. Superior and inferior pubic rami fractures on the left. The pubic symphysis is intact. 8. Moderate-sized hematoma involving the left iliacus muscle and gluteus minimus  muscle.   Electronically Signed   By: Kalman Jewels M.D.   On: 03/04/2014 15:11   Ct Cervical Spine Wo Contrast  03/04/2014   CLINICAL DATA:  [place] off of a roof.  Hit head.  EXAM: CT HEAD WITHOUT CONTRAST  CT CERVICAL SPINE WITHOUT CONTRAST  TECHNIQUE: Multidetector CT imaging of the head and cervical spine was performed following the standard protocol without intravenous contrast. Multiplanar CT image reconstructions of the cervical spine were also generated.  COMPARISON:  Head CT from 2005.  FINDINGS: CT HEAD FINDINGS  There is a sizable left high parietal scalp hematoma without underlying skull fracture. No radiopaque foreign body.  No acute intracranial findings. No subdural hematoma, infarct or intracranial hemorrhage. The brainstem and cerebellum are unremarkable. 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  CT CERVICAL SPINE FINDINGS  Normal alignment of the cervical vertebral bodies. Disc spaces and vertebral bodies are maintained. No acute fracture or abnormal prevertebral soft tissue swelling. The facets are normally aligned. The skullbase C1 and C1-2 articulations are maintained. The dens is normal. No large disc protrusions, spinal or foraminal stenosis. The lung apices are clear. A small left apical pneumothorax is noted.  IMPRESSION: 1. High left parietal scalp hematoma without underlying skull fracture. 2. No acute intracranial findings. 3. Normal alignment of  the cervical vertebral bodies and no acute cervical spine fracture. 4. Small left apical pneumothorax   Electronically Signed   By: Kalman Jewels M.D.   On: 03/04/2014 14:58   Ct Abdomen Pelvis W Contrast  03/04/2014   CLINICAL DATA:  [place] off a roof. Pelvic fractures and left-sided pneumothorax.  EXAM: CT CHEST, ABDOMEN, AND PELVIS WITH CONTRAST  TECHNIQUE: Multidetector CT imaging of the chest, abdomen and pelvis was performed following the standard protocol during bolus administration of intravenous contrast.  CONTRAST:  4mL  OMNIPAQUE IOHEXOL 300 MG/ML  SOLN  COMPARISON:  CT scan 12/21/2011.  FINDINGS: CT CHEST FINDINGS  Chest wall: There is a distal left clavicle fracture noted. The sternum is intact. No thoracic vertebral body fractures are identified. There are normally aligned. Suspect a subtle nondisplaced fracture involving the anterior aspect of the first left rib. This is best seen on the sagittal reformatted images. On the right side there appears to be a small nondisplaced fracture near the first rib articulation with the sternum. There is an associated small substernal hematoma.  Mediastinum: The heart is normal in size. No pericardial effusion. There is a stable benign epicardial cyst on the right side. The aorta and branch vessels are patent. No dissection. No mediastinal hematoma, mass or adenopathy. The esophagus is grossly normal.  Lungs: There is a small left apical pneumothorax. Dependent bibasilar atelectasis but no pulmonary contusions. No pleural effusion.  CT ABDOMEN AND PELVIS FINDINGS  The solid abdominal organs are intact. No acute injury is identified. The gallbladder is normal. No common bowel duct dilatation. No mesenteric or retroperitoneal mass or hematoma.  The stomach, duodenum, small bowel and colon are grossly normal without oral contrast. No free air or free fluid.  The bladder, prostate gland and seminal vesicles are normal. No pelvic mass or intrapelvic hematoma or fluid.  There is a comminuted fracture involving the left iliac wing extending all the way down to just above the superior and anterior aspect of the acetabulum. It does not involve the acetabulum itself. Both hips are normally located. The pubic symphysis and SI joints are intact. There are superior and inferior pubic rami fractures on the left side. There may be a small anterior sacral fracture on image number 104. Associated hematoma is noted in the iliacus muscle and gluteus minimus muscles.  The lumbar vertebral bodies are normally  aligned. No transverse process fractures.  IMPRESSION: 1. Small left-sided pneumothorax. No pulmonary contusion or displaced rib fractures. 2. Distal left clavicle fracture and nondisplaced subtle first left rib fracture anteriorly. 3. Suspect a small fracture near the first rib articulation with the sternum with an associated small substernal hematoma. 4. Normal heart and great vessels. 5. Intact solid abdominal organs. 6. Comminuted fracture involving the left iliac wing. No involvement of the left acetabulum or left SI joint. 7. Superior and inferior pubic rami fractures on the left. The pubic symphysis is intact. 8. Moderate-sized hematoma involving the left iliacus muscle and gluteus minimus muscle.   Electronically Signed   By: Kalman Jewels M.D.   On: 03/04/2014 15:11   Dg Pelvis Portable  03/04/2014   ADDENDUM REPORT: 03/04/2014 14:54  ADDENDUM: Small left apical pneumothorax is suspected.   Electronically Signed   By: Kalman Jewels M.D.   On: 03/04/2014 14:54   03/04/2014   CLINICAL DATA:  [place] off a roof.  EXAM: PORTABLE CHEST - 1 VIEW; PORTABLE PELVIS 1-2 VIEWS  COMPARISON:  06/07/2003.  FINDINGS: The cardiac silhouette, mediastinal  and hilar contours are within normal limits given the AP projection of the film and the supine position of the patient. No mediastinal widening or apical pleural cap. The lungs are grossly clear. No pleural effusion or pneumothorax. I do not see any definite rib fractures. Remote healed right clavicle fracture is noted. Could not exclude the possibility of a distal left clavicle fracture but recommend correlation with pain and tenderness in this area.  IMPRESSION: No acute cardiopulmonary findings.  Intact bony thorax. Could not exclude the possibility of a distal left clavicle fracture.  Electronically Signed: By: Kalman Jewels M.D. On: 03/04/2014 14:04   Dg Pelvis Comp Min 3v  03/05/2014   CLINICAL DATA:  Fall off roof yesterday, pelvic fracture, left side  pain  EXAM: JUDET PELVIS - 3+ VIEW  COMPARISON:  03/04/2014  FINDINGS: Three views of the pelvis submitted including Judet view. Again noted Mild displaced comminuted fracture of the left iliac wing. There is mild displaced fracture of left inferior pubic ramus. Nondisplaced fracture of the left superior pubic ramus. Again noted a transverse fracture line in left iliac bone progressing to left SI joint.  IMPRESSION: Again noted Mild displaced post traumatic comminuted fracture of the left iliac wing. There is mild displaced fracture of left inferior pubic ramus. Nondisplaced fracture of the left superior pubic ramus. Again noted a transverse fracture line in left iliac bone progressing to left SI joint.   Electronically Signed   By: Lahoma Crocker M.D.   On: 03/05/2014 09:46   Dg Hand 2 View Right  03/04/2014   CLINICAL DATA:  Pt fell off of a ladder today and has pain in the 2nd digit in the right hand that travels through the hand  EXAM: RIGHT HAND - 2 VIEW  COMPARISON:  None.  FINDINGS: No acute fracture.  No dislocation.  There is a well corticated bone fragment medial to the PIP joint of the fourth finger. This has a chronic appearance.  There are minor osteoarthritic changes with small marginal osteophytes and slight asymmetric joint space narrowing of several of the interphalangeal joints. Mild arthropathic changes are noted of the second and third metacarpophalangeal joints.  Soft tissues are unremarkable.  No radiopaque foreign body.  IMPRESSION: No fracture or dislocation or acute finding.   Electronically Signed   By: Lajean Manes M.D.   On: 03/04/2014 16:35   Ct 3d Independent Darreld Mclean  03/04/2014   CLINICAL DATA:  Nonspecific (abnormal) findings on radiological and other examination of musculoskeletal sysem. Patient fell off roof, pelvic fractures  EXAM: 3-DIMENSIONAL CT IMAGE RENDERING ON INDEPENDENT WORKSTATION  TECHNIQUE: 3-dimensional CT images were rendered by post-processing of the original CT  data on an independent workstation. The 3-dimensional CT images were interpreted and findings were reported in the accompanying complete CT report for this study  COMPARISON:  CT chest abdomen and pelvis 03/04/2014  FINDINGS: Comminuted mildly displaced fracture involving the LEFT iliac wing, with fracture planes coursing superior and anterior to the acetabulum without discrete acetabular involvement.  An additional oblique fracture plane extends through superior LEFT iliac wing into LEFT SI joint.  Nondisplaced fracture LEFT superior pubic ramus.  Minimally displaced fracture inferior LEFT pubic ramus.  Hips normally located bilaterally.  SI joints appear symmetric.  Subtle anterior LEFT sacral fracture on axial images is not definitely visualized on the 3D reconstructions.  RIGHT pelvis appears intact.  L5 vertebra appears intact and normally aligned.  IMPRESSION: Multiple fracture planes involving the LEFT iliac wing remaining  external to the LEFT acetabulum but extending in a nondisplaced fracture plane into the LEFT SI joint.  Fractures of LEFT superior and inferior pubic rami.   Electronically Signed   By: Lavonia Dana M.D.   On: 03/04/2014 18:58   Dg Chest Port 1 View  03/05/2014   CLINICAL DATA:  58 year old male with left pneumothorax status post fall from roof. Initial encounter.  EXAM: PORTABLE CHEST - 1 VIEW  COMPARISON:  Chest CT 03/04/2014 and earlier.  FINDINGS: Portable AP semi upright view at 0536 hrs. Small left pneumothorax has not significantly changed. Minimally displaced distal left clavicle fracture re- identified. Lower lung volumes. Stable cardiac size and mediastinal contours. No pulmonary edema, pleural effusion or pulmonary contusion identified. Visualized tracheal air column is within normal limits.  IMPRESSION: 1. Stable small left pneumothorax. 2. No new cardiopulmonary abnormality.   Electronically Signed   By: Lars Pinks M.D.   On: 03/05/2014 07:14   Dg Chest Port 1  View  03/04/2014   ADDENDUM REPORT: 03/04/2014 14:54  ADDENDUM: Small left apical pneumothorax is suspected.   Electronically Signed   By: Kalman Jewels M.D.   On: 03/04/2014 14:54   03/04/2014   CLINICAL DATA:  [place] off a roof.  EXAM: PORTABLE CHEST - 1 VIEW; PORTABLE PELVIS 1-2 VIEWS  COMPARISON:  06/07/2003.  FINDINGS: The cardiac silhouette, mediastinal and hilar contours are within normal limits given the AP projection of the film and the supine position of the patient. No mediastinal widening or apical pleural cap. The lungs are grossly clear. No pleural effusion or pneumothorax. I do not see any definite rib fractures. Remote healed right clavicle fracture is noted. Could not exclude the possibility of a distal left clavicle fracture but recommend correlation with pain and tenderness in this area.  IMPRESSION: No acute cardiopulmonary findings.  Intact bony thorax. Could not exclude the possibility of a distal left clavicle fracture.  Electronically Signed: By: Kalman Jewels M.D. On: 03/04/2014 14:04    Anti-infectives: Anti-infectives    None      Assessment/Plan: s/p  d/c foley Advance diet Ambulate with PT  Transfer to 5N Decrease IVFs.  LOS: 1 day   Kathryne Eriksson. Dahlia Bailiff, MD, FACS [phone] Trauma Surgeon 03/05/2014

## 2014-03-05 NOTE — Progress Notes (Signed)
Chaplain introduced herself to pt and family. Pt expressed that he is thankful that his fall was not worse. Services not needed at this time. Page chaplain as needed.   03/05/14 1100  Clinical Encounter Type  Visited With Patient and family together  Visit Type Initial  Roger Wilson, Barbette Hair, Chaplain 03/05/2014 11:28 AM

## 2014-03-05 NOTE — Consult Note (Signed)
[facility] Consultation  Reason for Consult: Pelvic ring fractures, displaced iliac wing Referring Physician: Trauma Service, Dr. Carman Ching; Ortho, Dr. Paula Libra  Roger Wilson is an 58 y.o. male.  HPI: Larey Seat off roof with left sided rib fractures, iliac wing and ischium, left distal clavicle, and left pneumothorax. Denies numbness and tingling distally. Reports improvement of pelvic pain with sheet in place.  C/o right shoulder pain today also. H/o of right clavicle fracture and shoulder pain for which he has seen Dr. Rennis Chris.  Past Medical History  Diagnosis Date  . Environmental allergies   . Hypertension     Past Surgical History  Procedure Laterality Date  . Knee arthroscopy  2002    left    Family History  Problem Relation Age of Onset  . Colon cancer Neg Hx   . Stomach cancer Neg Hx     Social History:  reports that he has never smoked. He has never used smokeless tobacco. He reports that he drinks about 4.2 oz of alcohol per week. He reports that he does not use illicit drugs.  Allergies:  Allergies  Allergen Reactions  . Morphine     Veins turn red after injection    Medications: I have reviewed the patient's current medications.  Results for orders placed or performed during the hospital encounter of 03/04/14 (from the past 48 hour(s))  Comprehensive metabolic panel     Status: Abnormal   Collection Time: 03/04/14  1:16 PM  Result Value Ref Range   Sodium 138 137 - 147 mEq/L   Potassium 4.2 3.7 - 5.3 mEq/L   Chloride 103 96 - 112 mEq/L   CO2 27 19 - 32 mEq/L   Glucose, Bld 123 (H) 70 - 99 mg/dL   BUN 12 6 - 23 mg/dL   Creatinine, Ser 9.13 0.50 - 1.35 mg/dL   Calcium 8.3 (L) 8.4 - 10.5 mg/dL   Total Protein 6.3 6.0 - 8.3 g/dL   Albumin 3.4 (L) 3.5 - 5.2 g/dL   AST 41 (H) 0 - 37 U/L   ALT 32 0 - 53 U/L   Alkaline Phosphatase 44 39 - 117 U/L   Total Bilirubin 0.6 0.3 - 1.2 mg/dL   GFR calc non Af Amer >90 >90 mL/min   GFR calc Af  Amer >90 >90 mL/min    Comment: (NOTE) The eGFR has been calculated using the CKD EPI equation. This calculation has not been validated in all clinical situations. eGFR's persistently <90 mL/min signify possible Chronic Kidney Disease.    Anion gap 8 5 - 15  I-stat chem 8, ed     Status: Abnormal   Collection Time: 03/04/14  1:30 PM  Result Value Ref Range   Sodium 141 137 - 147 mEq/L   Potassium 4.1 3.7 - 5.3 mEq/L   Chloride 102 96 - 112 mEq/L   BUN 12 6 - 23 mg/dL   Creatinine, Ser 5.16 0.50 - 1.35 mg/dL   Glucose, Bld 703 (H) 70 - 99 mg/dL   Calcium, Ion 9.33 8.44 - 1.30 mmol/L   TCO2 28 0 - 100 mmol/L   Hemoglobin 14.6 13.0 - 17.0 g/dL   HCT 48.8 19.1 - 28.8 %  CBC with Differential     Status: Abnormal   Collection Time: 03/04/14  1:57 PM  Result Value Ref Range   WBC 9.3 4.0 - 10.5 K/uL   RBC 4.77 4.22 - 5.81 MIL/uL   Hemoglobin 14.6 13.0 - 17.0 g/dL  HCT 41.0 39.0 - 52.0 %   MCV 86.0 78.0 - 100.0 fL   MCH 30.6 26.0 - 34.0 pg   MCHC 35.6 30.0 - 36.0 g/dL   RDW 12.3 11.5 - 15.5 %   Platelets 129 (L) 150 - 400 K/uL   Neutrophils Relative % 86 (H) 43 - 77 %   Neutro Abs 7.9 (H) 1.7 - 7.7 K/uL   Lymphocytes Relative 8 (L) 12 - 46 %   Lymphs Abs 0.8 0.7 - 4.0 K/uL   Monocytes Relative 5 3 - 12 %   Monocytes Absolute 0.5 0.1 - 1.0 K/uL   Eosinophils Relative 1 0 - 5 %   Eosinophils Absolute 0.1 0.0 - 0.7 K/uL   Basophils Relative 0 0 - 1 %   Basophils Absolute 0.0 0.0 - 0.1 K/uL  Type and screen     Status: None   Collection Time: 03/04/14  2:50 PM  Result Value Ref Range   ABO/RH(D) O POS    Antibody Screen NEG    Sample Expiration 03/07/2014   ABO/Rh     Status: None   Collection Time: 03/04/14  2:50 PM  Result Value Ref Range   ABO/RH(D) O POS   MRSA PCR Screening     Status: None   Collection Time: 03/04/14  4:55 PM  Result Value Ref Range   MRSA by PCR NEGATIVE NEGATIVE    Comment:        The GeneXpert MRSA Assay (FDA approved for NASAL  specimens only), is one component of a comprehensive MRSA colonization surveillance program. It is not intended to diagnose MRSA infection nor to guide or monitor treatment for MRSA infections.   Provider-confirm verbal Blood Bank order - Type & Screen; Order taken: 03/04/2014; 3:00 PM; Surgery     Status: None   Collection Time: 03/05/14  2:00 AM  Result Value Ref Range   Blood product order confirm MD AUTHORIZATION REQUESTED   CBC     Status: Abnormal   Collection Time: 03/05/14  2:40 AM  Result Value Ref Range   WBC 8.4 4.0 - 10.5 K/uL   RBC 4.55 4.22 - 5.81 MIL/uL   Hemoglobin 14.3 13.0 - 17.0 g/dL   HCT 39.1 39.0 - 52.0 %   MCV 85.9 78.0 - 100.0 fL   MCH 31.4 26.0 - 34.0 pg   MCHC 36.6 (H) 30.0 - 36.0 g/dL   RDW 12.6 11.5 - 15.5 %   Platelets 123 (L) 150 - 400 K/uL  Basic metabolic panel     Status: Abnormal   Collection Time: 03/05/14  2:40 AM  Result Value Ref Range   Sodium 134 (L) 137 - 147 mEq/L    Comment: DELTA CHECK NOTED   Potassium 4.1 3.7 - 5.3 mEq/L   Chloride 100 96 - 112 mEq/L   CO2 24 19 - 32 mEq/L   Glucose, Bld 113 (H) 70 - 99 mg/dL   BUN 10 6 - 23 mg/dL   Creatinine, Ser 0.80 0.50 - 1.35 mg/dL   Calcium 8.0 (L) 8.4 - 10.5 mg/dL   GFR calc non Af Amer >90 >90 mL/min   GFR calc Af Amer >90 >90 mL/min    Comment: (NOTE) The eGFR has been calculated using the CKD EPI equation. This calculation has not been validated in all clinical situations. eGFR's persistently <90 mL/min signify possible Chronic Kidney Disease.    Anion gap 10 5 - 15    Ct Head Wo Contrast  03/04/2014  CLINICAL DATA:  [place] off of a roof.  Hit head.  EXAM: CT HEAD WITHOUT CONTRAST  CT CERVICAL SPINE WITHOUT CONTRAST  TECHNIQUE: Multidetector CT imaging of the head and cervical spine was performed following the standard protocol without intravenous contrast. Multiplanar CT image reconstructions of the cervical spine were also generated.  COMPARISON:  Head CT from 2005.  FINDINGS:  CT HEAD FINDINGS  There is a sizable left high parietal scalp hematoma without underlying skull fracture. No radiopaque foreign body.  No acute intracranial findings. No subdural hematoma, infarct or intracranial hemorrhage. The brainstem and cerebellum are unremarkable. 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  CT CERVICAL SPINE FINDINGS  Normal alignment of the cervical vertebral bodies. Disc spaces and vertebral bodies are maintained. No acute fracture or abnormal prevertebral soft tissue swelling. The facets are normally aligned. The skullbase C1 and C1-2 articulations are maintained. The dens is normal. No large disc protrusions, spinal or foraminal stenosis. The lung apices are clear. A small left apical pneumothorax is noted.  IMPRESSION: 1. High left parietal scalp hematoma without underlying skull fracture. 2. No acute intracranial findings. 3. Normal alignment of the cervical vertebral bodies and no acute cervical spine fracture. 4. Small left apical pneumothorax   Electronically Signed   By: Kalman Jewels M.D.   On: 03/04/2014 14:58   Ct Chest W Contrast  03/04/2014   CLINICAL DATA:  [place] off a roof. Pelvic fractures and left-sided pneumothorax.  EXAM: CT CHEST, ABDOMEN, AND PELVIS WITH CONTRAST  TECHNIQUE: Multidetector CT imaging of the chest, abdomen and pelvis was performed following the standard protocol during bolus administration of intravenous contrast.  CONTRAST:  79mL OMNIPAQUE IOHEXOL 300 MG/ML  SOLN  COMPARISON:  CT scan 12/21/2011.  FINDINGS: CT CHEST FINDINGS  Chest wall: There is a distal left clavicle fracture noted. The sternum is intact. No thoracic vertebral body fractures are identified. There are normally aligned. Suspect a subtle nondisplaced fracture involving the anterior aspect of the first left rib. This is best seen on the sagittal reformatted images. On the right side there appears to be a small nondisplaced fracture near the first rib articulation with the sternum.  There is an associated small substernal hematoma.  Mediastinum: The heart is normal in size. No pericardial effusion. There is a stable benign epicardial cyst on the right side. The aorta and branch vessels are patent. No dissection. No mediastinal hematoma, mass or adenopathy. The esophagus is grossly normal.  Lungs: There is a small left apical pneumothorax. Dependent bibasilar atelectasis but no pulmonary contusions. No pleural effusion.  CT ABDOMEN AND PELVIS FINDINGS  The solid abdominal organs are intact. No acute injury is identified. The gallbladder is normal. No common bowel duct dilatation. No mesenteric or retroperitoneal mass or hematoma.  The stomach, duodenum, small bowel and colon are grossly normal without oral contrast. No free air or free fluid.  The bladder, prostate gland and seminal vesicles are normal. No pelvic mass or intrapelvic hematoma or fluid.  There is a comminuted fracture involving the left iliac wing extending all the way down to just above the superior and anterior aspect of the acetabulum. It does not involve the acetabulum itself. Both hips are normally located. The pubic symphysis and SI joints are intact. There are superior and inferior pubic rami fractures on the left side. There may be a small anterior sacral fracture on image number 104. Associated hematoma is noted in the iliacus muscle and gluteus minimus muscles.  The lumbar vertebral bodies  are normally aligned. No transverse process fractures.  IMPRESSION: 1. Small left-sided pneumothorax. No pulmonary contusion or displaced rib fractures. 2. Distal left clavicle fracture and nondisplaced subtle first left rib fracture anteriorly. 3. Suspect a small fracture near the first rib articulation with the sternum with an associated small substernal hematoma. 4. Normal heart and great vessels. 5. Intact solid abdominal organs. 6. Comminuted fracture involving the left iliac wing. No involvement of the left acetabulum or left SI  joint. 7. Superior and inferior pubic rami fractures on the left. The pubic symphysis is intact. 8. Moderate-sized hematoma involving the left iliacus muscle and gluteus minimus muscle.   Electronically Signed   By: Kalman Jewels M.D.   On: 03/04/2014 15:11   Ct Cervical Spine Wo Contrast  03/04/2014   CLINICAL DATA:  [place] off of a roof.  Hit head.  EXAM: CT HEAD WITHOUT CONTRAST  CT CERVICAL SPINE WITHOUT CONTRAST  TECHNIQUE: Multidetector CT imaging of the head and cervical spine was performed following the standard protocol without intravenous contrast. Multiplanar CT image reconstructions of the cervical spine were also generated.  COMPARISON:  Head CT from 2005.  FINDINGS: CT HEAD FINDINGS  There is a sizable left high parietal scalp hematoma without underlying skull fracture. No radiopaque foreign body.  No acute intracranial findings. No subdural hematoma, infarct or intracranial hemorrhage. The brainstem and cerebellum are unremarkable. 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  CT CERVICAL SPINE FINDINGS  Normal alignment of the cervical vertebral bodies. Disc spaces and vertebral bodies are maintained. No acute fracture or abnormal prevertebral soft tissue swelling. The facets are normally aligned. The skullbase C1 and C1-2 articulations are maintained. The dens is normal. No large disc protrusions, spinal or foraminal stenosis. The lung apices are clear. A small left apical pneumothorax is noted.  IMPRESSION: 1. High left parietal scalp hematoma without underlying skull fracture. 2. No acute intracranial findings. 3. Normal alignment of the cervical vertebral bodies and no acute cervical spine fracture. 4. Small left apical pneumothorax   Electronically Signed   By: Kalman Jewels M.D.   On: 03/04/2014 14:58   Ct Abdomen Pelvis W Contrast  03/04/2014   CLINICAL DATA:  [place] off a roof. Pelvic fractures and left-sided pneumothorax.  EXAM: CT CHEST, ABDOMEN, AND PELVIS WITH CONTRAST  TECHNIQUE:  Multidetector CT imaging of the chest, abdomen and pelvis was performed following the standard protocol during bolus administration of intravenous contrast.  CONTRAST:  45mL OMNIPAQUE IOHEXOL 300 MG/ML  SOLN  COMPARISON:  CT scan 12/21/2011.  FINDINGS: CT CHEST FINDINGS  Chest wall: There is a distal left clavicle fracture noted. The sternum is intact. No thoracic vertebral body fractures are identified. There are normally aligned. Suspect a subtle nondisplaced fracture involving the anterior aspect of the first left rib. This is best seen on the sagittal reformatted images. On the right side there appears to be a small nondisplaced fracture near the first rib articulation with the sternum. There is an associated small substernal hematoma.  Mediastinum: The heart is normal in size. No pericardial effusion. There is a stable benign epicardial cyst on the right side. The aorta and branch vessels are patent. No dissection. No mediastinal hematoma, mass or adenopathy. The esophagus is grossly normal.  Lungs: There is a small left apical pneumothorax. Dependent bibasilar atelectasis but no pulmonary contusions. No pleural effusion.  CT ABDOMEN AND PELVIS FINDINGS  The solid abdominal organs are intact. No acute injury is identified. The gallbladder is normal. No common  bowel duct dilatation. No mesenteric or retroperitoneal mass or hematoma.  The stomach, duodenum, small bowel and colon are grossly normal without oral contrast. No free air or free fluid.  The bladder, prostate gland and seminal vesicles are normal. No pelvic mass or intrapelvic hematoma or fluid.  There is a comminuted fracture involving the left iliac wing extending all the way down to just above the superior and anterior aspect of the acetabulum. It does not involve the acetabulum itself. Both hips are normally located. The pubic symphysis and SI joints are intact. There are superior and inferior pubic rami fractures on the left side. There may be a  small anterior sacral fracture on image number 104. Associated hematoma is noted in the iliacus muscle and gluteus minimus muscles.  The lumbar vertebral bodies are normally aligned. No transverse process fractures.  IMPRESSION: 1. Small left-sided pneumothorax. No pulmonary contusion or displaced rib fractures. 2. Distal left clavicle fracture and nondisplaced subtle first left rib fracture anteriorly. 3. Suspect a small fracture near the first rib articulation with the sternum with an associated small substernal hematoma. 4. Normal heart and great vessels. 5. Intact solid abdominal organs. 6. Comminuted fracture involving the left iliac wing. No involvement of the left acetabulum or left SI joint. 7. Superior and inferior pubic rami fractures on the left. The pubic symphysis is intact. 8. Moderate-sized hematoma involving the left iliacus muscle and gluteus minimus muscle.   Electronically Signed   By: Loralie Champagne M.D.   On: 03/04/2014 15:11   Dg Pelvis Portable  03/04/2014   ADDENDUM REPORT: 03/04/2014 14:54  ADDENDUM: Small left apical pneumothorax is suspected.   Electronically Signed   By: Loralie Champagne M.D.   On: 03/04/2014 14:54   03/04/2014   CLINICAL DATA:  Lare[place] Seat off a roof.  EXAM: PORTABLE CHEST - 1 VIEW; PORTABLE PELVIS 1-2 VIEWS  COMPARISON:  06/07/2003.  FINDINGS: The cardiac silhouette, mediastinal and hilar contours are within normal limits given the AP projection of the film and the supine position of the patient. No mediastinal widening or apical pleural cap. The lungs are grossly clear. No pleural effusion or pneumothorax. I do not see any definite rib fractures. Remote healed right clavicle fracture is noted. Could not exclude the possibility of a distal left clavicle fracture but recommend correlation with pain and tenderness in this area.  IMPRESSION: No acute cardiopulmonary findings.  Intact bony thorax. Could not exclude the possibility of a distal left clavicle fracture.   Electronically Signed: By: Loralie Champagne M.D. On: 03/04/2014 14:04   Dg Hand 2 View Right  03/04/2014   CLINICAL DATA:  Pt fell off of a ladder today and has pain in the 2nd digit in the right hand that travels through the hand  EXAM: RIGHT HAND - 2 VIEW  COMPARISON:  None.  FINDINGS: No acute fracture.  No dislocation.  There is a well corticated bone fragment medial to the PIP joint of the fourth finger. This has a chronic appearance.  There are minor osteoarthritic changes with small marginal osteophytes and slight asymmetric joint space narrowing of several of the interphalangeal joints. Mild arthropathic changes are noted of the second and third metacarpophalangeal joints.  Soft tissues are unremarkable.  No radiopaque foreign body.  IMPRESSION: No fracture or dislocation or acute finding.   Electronically Signed   By: Amie Portland M.D.   On: 03/04/2014 16:35   Ct 3d Independent Annabell Sabal  03/04/2014   CLINICAL DATA:  Nonspecific (abnormal)  findings on radiological and other examination of musculoskeletal sysem. Patient fell off roof, pelvic fractures  EXAM: 3-DIMENSIONAL CT IMAGE RENDERING ON INDEPENDENT WORKSTATION  TECHNIQUE: 3-dimensional CT images were rendered by post-processing of the original CT data on an independent workstation. The 3-dimensional CT images were interpreted and findings were reported in the accompanying complete CT report for this study  COMPARISON:  CT chest abdomen and pelvis 03/04/2014  FINDINGS: Comminuted mildly displaced fracture involving the LEFT iliac wing, with fracture planes coursing superior and anterior to the acetabulum without discrete acetabular involvement.  An additional oblique fracture plane extends through superior LEFT iliac wing into LEFT SI joint.  Nondisplaced fracture LEFT superior pubic ramus.  Minimally displaced fracture inferior LEFT pubic ramus.  Hips normally located bilaterally.  SI joints appear symmetric.  Subtle anterior LEFT sacral fracture  on axial images is not definitely visualized on the 3D reconstructions.  RIGHT pelvis appears intact.  L5 vertebra appears intact and normally aligned.  IMPRESSION: Multiple fracture planes involving the LEFT iliac wing remaining external to the LEFT acetabulum but extending in a nondisplaced fracture plane into the LEFT SI joint.  Fractures of LEFT superior and inferior pubic rami.   Electronically Signed   By: Lavonia Dana M.D.   On: 03/04/2014 18:58   Dg Chest Port 1 View  03/05/2014   CLINICAL DATA:  58 year old male with left pneumothorax status post fall from roof. Initial encounter.  EXAM: PORTABLE CHEST - 1 VIEW  COMPARISON:  Chest CT 03/04/2014 and earlier.  FINDINGS: Portable AP semi upright view at 0536 hrs. Small left pneumothorax has not significantly changed. Minimally displaced distal left clavicle fracture re- identified. Lower lung volumes. Stable cardiac size and mediastinal contours. No pulmonary edema, pleural effusion or pulmonary contusion identified. Visualized tracheal air column is within normal limits.  IMPRESSION: 1. Stable small left pneumothorax. 2. No new cardiopulmonary abnormality.   Electronically Signed   By: Lars Pinks M.D.   On: 03/05/2014 07:14   Dg Chest Port 1 View  03/04/2014   ADDENDUM REPORT: 03/04/2014 14:54  ADDENDUM: Small left apical pneumothorax is suspected.   Electronically Signed   By: Kalman Jewels M.D.   On: 03/04/2014 14:54   03/04/2014   CLINICAL DATA:  [place] off a roof.  EXAM: PORTABLE CHEST - 1 VIEW; PORTABLE PELVIS 1-2 VIEWS  COMPARISON:  06/07/2003.  FINDINGS: The cardiac silhouette, mediastinal and hilar contours are within normal limits given the AP projection of the film and the supine position of the patient. No mediastinal widening or apical pleural cap. The lungs are grossly clear. No pleural effusion or pneumothorax. I do not see any definite rib fractures. Remote healed right clavicle fracture is noted. Could not exclude the possibility of a  distal left clavicle fracture but recommend correlation with pain and tenderness in this area.  IMPRESSION: No acute cardiopulmonary findings.  Intact bony thorax. Could not exclude the possibility of a distal left clavicle fracture.  Electronically Signed: By: Kalman Jewels M.D. On: 03/04/2014 14:04    ROS Blood pressure 131/77, pulse 64, temperature 98.6 F (37 C), temperature source Oral, resp. rate 20, height $RemoveBe'5\' 11"'[id number]$  (1.803 m), weight 181 lb 10.5 oz (82.4 kg), SpO2 93 %. Physical Exam  Well appearing, appropriate for age. LUEx  Tender shoulder  Elbow, wrist, digits- no skin wounds, nontender, no instability, no blocks to motion  Sens  Ax/R/M/U intact  Mot   Ax/ R/ PIN/ M/ AIN/ U intact  Rad 2+ RUEx  shoulder, elbow, wrist, digits- no skin wounds, nontender, no instability, no blocks to motion  Sens  Ax/R/M/U intact  Mot   Ax/ R/ PIN/ M/ AIN/ U intact  Rad 2+ Pelvis--sheet in place, no traumatic wounds, tender LLE No traumatic wounds, ecchymosis, or rash  Nontender  No pain with axial loading  No effusions  Knee stable to varus/ valgus and anterior/posterior stress  Sens DPN, SPN, TN intact  Motor EHL, ext, flex, evers 5/5  DP 2+, PT 2+, No significant edema RLE No traumatic wounds, ecchymosis, or rash  Nontender  No effusions  Knee stable to varus/ valgus and anterior/posterior stress  Sens DPN, SPN, TN intact  Motor EHL, ext, flex, evers 5/5  DP 2+, PT 2+, No significant edema   Assessment/Plan: Left iliac wing fracture with left superior and inferior pubic rami fracture and retroperitoneal hematoma Left small pneumothorax Left first rib fracture Left clavicle fracture Scalp hematoma Right shoulder pain with specific findings  1. PT/OT with WBAT on LLE 2. Trapeze for bed 3. Will plan for nonoperative management of pelvis at this time; would expect control of pain to be very similar in 5-7 days but if unable to tolerate pain with mobilization can reconsider surgical  repair; have discussed alternatives with the patient who agrees with the plan.  He does request a pelvic binder of sorts for symptomatic relief and we will order. 4. New films with inlet, outlet, Judets post mobilization.  [place] , MD [facility] [phone] (p)   03/05/2014  8:52 AM

## 2014-03-05 NOTE — Progress Notes (Signed)
Foley removed just before noon today.  Encouraged pt to void for a while now and he is unable to.  Bladder scan revealed 780ml.  I&O cath performed revealing 980ml.  Will continue to monitor pt.

## 2014-03-05 NOTE — Progress Notes (Signed)
UR completed 

## 2014-03-06 MED ORDER — LORATADINE 10 MG PO TABS: 10.0000 mg | ORAL_TABLET | Freq: Every day | ORAL | Status: DC

## 2014-03-06 MED ORDER — BETHANECHOL CHLORIDE 25 MG PO TABS
25.0000 mg | ORAL_TABLET | Freq: Three times a day (TID) | ORAL | Status: DC
  Administered 2014-03-06 – 2014-03-08 (×7): 25 mg via ORAL
  Filled 2014-03-06 (×10): qty 1

## 2014-03-06 MED ORDER — KETOROLAC TROMETHAMINE 15 MG/ML IJ SOLN
15.0000 mg | Freq: Three times a day (TID) | INTRAMUSCULAR | Status: AC
  Administered 2014-03-06 – 2014-03-08 (×6): 15 mg via INTRAVENOUS
  Filled 2014-03-06 (×6): qty 1

## 2014-03-06 MED ORDER — OXYCODONE HCL 5 MG PO TABS
5.0000 mg | ORAL_TABLET | ORAL | Status: DC | PRN
  Administered 2014-03-06: 10 mg via ORAL
  Filled 2014-03-06: qty 2

## 2014-03-06 MED ORDER — OXYCODONE HCL 5 MG PO TABS
10.0000 mg | ORAL_TABLET | ORAL | Status: DC | PRN
  Administered 2014-03-07 – 2014-03-08 (×5): 10 mg via ORAL
  Filled 2014-03-06 (×5): qty 2

## 2014-03-06 MED ORDER — ENOXAPARIN SODIUM 40 MG/0.4ML ~~LOC~~ SOLN
40.0000 mg | Freq: Every day | SUBCUTANEOUS | Status: DC
  Administered 2014-03-06 – 2014-03-08 (×3): 40 mg via SUBCUTANEOUS
  Filled 2014-03-06 (×3): qty 0.4

## 2014-03-06 MED ORDER — TAMSULOSIN HCL 0.4 MG PO CAPS
0.4000 mg | ORAL_CAPSULE | Freq: Every day | ORAL | Status: DC
  Administered 2014-03-06 – 2014-03-08 (×3): 0.4 mg via ORAL
  Filled 2014-03-06 (×3): qty 1
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
## 2014-03-06 NOTE — Progress Notes (Signed)
Patient able to void small amounts 135ml or less. Bladder scan revealed >972ml. Foley re-inserted per protocol. Clear yellow urine returned. Will monitor.

## 2014-03-06 NOTE — Progress Notes (Signed)
Subjective:     L iliac wing fx, L distal clavicle fx Patient reports pain as moderate.   Reports pain L hip with motion and repositioning in bed Seen by Dr. Marcelino Scot yesterday Still unable to tolerate LUE sling Urinary retention overnight, foley reinserted after several bladder scans and I&O caths  Objective: Vital signs in last 24 hours: Temp:  [97.4 F (36.3 C)-98.6 F (37 C)] 98.2 F (36.8 C) (11/19 0400) Pulse Rate:  [56-78] 76 (11/19 0700) Resp:  [14-26] 20 (11/19 0700) BP: (94-160)/(72-91) 135/79 mmHg (11/19 0700) SpO2:  [91 %-98 %] 96 % (11/19 0700)  Intake/Output from previous day: 11/18 0701 - 11/19 0700 In: 1355.8 [I.V.:1355.8] Out: 2625 [Urine:2625] Intake/Output this shift:     Recent Labs  03/04/14 1330 03/04/14 1357 03/05/14 0240  HGB 14.6 14.6 14.3    Recent Labs  03/04/14 1357 03/05/14 0240  WBC 9.3 8.4  RBC 4.77 4.55  HCT 41.0 39.1  PLT 129* 123*    Recent Labs  03/04/14 1316 03/04/14 1330 03/05/14 0240  NA 138 141 134*  K 4.2 4.1 4.1  CL 103 102 100  CO2 27  --  24  BUN 12 12 10   CREATININE 0.86 0.90 0.80  GLUCOSE 123* 129* 113*  CALCIUM 8.3*  --  8.0*   No results for input(s): LABPT, INR in the last 72 hours.  Neurologically intact ABD soft Neurovascular intact Sensation intact distally Intact pulses distally Dorsiflexion/Plantar flexion intact No cellulitis present Compartment soft no calf pain or sign of DVT  Assessment/Plan:     Advance diet Up with therapy D/C IV fluids  Per Dr. Carlean Jews note may WBAT LLE, plan repeat xrays after weightbearing Discussed possible need for ORIF if unstable or if pain uncontrolled Discussed with Dr. Mliss Fritz, [place] Rolls. 03/06/2014, 7:36 AM

## 2014-03-06 NOTE — Progress Notes (Signed)
Patient ID: Roger Wilson, male   DOB: 1955/05/27, 58 y.o.   MRN: [id number]    Subjective: A lot of pain when working with therapies getting up to chair. Foley has been replaced due to retention.Tolerating diet.  Objective: Vital signs in last 24 hours: Temp:  [97.4 F (36.3 C)-98.3 F (36.8 C)] 97.9 F (36.6 C) (11/19 0757) Pulse Rate:  [56-78] 76 (11/19 0700) Resp:  [14-26] 18 (11/19 0900) BP: (94-160)/(75-91) 135/79 mmHg (11/19 0700) SpO2:  [93 %-98 %] 97 % (11/19 0900) Last BM Date: 03/04/14  Intake/Output from previous day: 11/18 0701 - 11/19 0700 In: 1355.8 [I.V.:1355.8] Out: 2625 [Urine:2625] Intake/Output this shift: Total I/O In: 220 [P.O.:120; I.V.:100] Out: -   General appearance: alert and cooperative Resp: clear to auscultation bilaterally and 1250IS Chest wall: right sided chest wall tenderness Cardio: regular rate and rhythm GI: soft, NT, ND, +BS Extremities: claves soft Neuro: A&O  Lab Results: CBC   Recent Labs  03/04/14 1357 03/05/14 0240  WBC 9.3 8.4  HGB 14.6 14.3  HCT 41.0 39.1  PLT 129* 123*   BMET  Recent Labs  03/04/14 1316 03/04/14 1330 03/05/14 0240  NA 138 141 134*  K 4.2 4.1 4.1  CL 103 102 100  CO2 27  --  24  GLUCOSE 123* 129* 113*  BUN 12 12 10   CREATININE 0.86 0.90 0.80  CALCIUM 8.3*  --  8.0*    Anti-infectives: Anti-infectives    None      Assessment/Plan: Fall from roof  L rib FXs and PTX - stable on CXR 11/18 L iliac wing FX, L sub and inf rami FXs - WBAT per Dr. Marcelino Scot. Pain mgmt an issue. Add short course toradol plus PO meds. FEN - tolerating diet PT/OT VTE - Lovenox Dispo - to floor, CIR consult    LOS: 2 days    Georganna Skeans, MD, MPH, FACS Trauma: [phone] General Surgery: [phone]  03/06/2014

## 2014-03-06 NOTE — Progress Notes (Signed)
Report called to Arbie Cookey, RN on [employer], pt will transfer to room 6N31 via chair with belongings with wife.

## 2014-03-06 NOTE — Evaluation (Signed)
Physical Therapy Evaluation Patient Details Name: Roger Wilson MRN: [id number] DOB: Feb 27, 1956 Today's Date: 03/06/2014   History of Present Illness  pt sustained a 2 story fall while cleaning gutters resulting in L distal Clavicle fx, L 1st Rib fx, L pelvic fxs, and L PTX.    Clinical Impression  Pt mobility limited mostly by pain in L hip.  Pt needs encouragement for continuing to attempt mobility throughout the day and not to remain so still.  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  Feel pt would benefit from CIR at D/C to maximize independence and return to active lifestyle.  Will continue to follow while on acute.      Follow Up Recommendations CIR    Equipment Recommendations  Rolling walker with 5" wheels    Recommendations for Other Services Rehab consult     Precautions / Restrictions Precautions Precautions: Fall Restrictions Weight Bearing Restrictions: No      Mobility  Bed Mobility Overal bed mobility: Needs Assistance;+2 for physical assistance Bed Mobility: Supine to Sit     Supine to sit: Max assist;+2 for physical assistance;HOB elevated     General bed mobility comments: pt does participate with moving LEs towards EOB, but then 2/2 pain becomes total A x2 to complete coming to sitting.  pt screaming out during 2nd half of mobility, but once in sitting pt calm again.    Transfers Overall transfer level: Needs assistance Equipment used: 2 person hand held assist Transfers: Sit to/from [facility] Sit to Stand: Mod assist;+2 physical assistance Stand pivot transfers: Max assist;+2 physical assistance       General transfer comment: pt needs cues and encouragement to participate in mobility as pt reluctent to use either UE and L LE.  Once in standing pt only a MinA x2 to maintain standing, but has difficulty pivoting on R LE towards chair.  Does not attempt to move L LE, only drags it.    Ambulation/Gait                Stairs            Wheelchair Mobility    Modified Rankin (Stroke Patients Only)       Balance Overall balance assessment: Needs assistance Sitting-balance support: Single extremity supported;Feet supported Sitting balance-Leahy Scale: Poor Sitting balance - Comments: pt utilizes R UE to maintain balance.                                       Pertinent Vitals/Pain Pain Assessment: 0-10 Pain Score: 10-Worst pain ever Pain Location: L hip worse than L shoulder and rib.   Pain Descriptors / Indicators: Sharp Pain Intervention(s): Monitored during session;Repositioned;PCA encouraged    Home Living Family/patient expects to be discharged to:: Inpatient rehab Living Arrangements: Spouse/significant other                    Prior Function Level of Independence: Independent         Comments: pt works as Programme researcher, broadcasting/film/video.     Hand Dominance        Extremity/Trunk Assessment   Upper Extremity Assessment: Defer to OT evaluation           Lower Extremity Assessment: LLE deficits/detail   LLE Deficits / Details: Limited by pain in L hip.    Cervical / Trunk Assessment: Normal  Communication  Communication: No difficulties  Cognition Arousal/Alertness: Awake/alert Behavior During Therapy: WFL for tasks assessed/performed Overall Cognitive Status: Within Functional Limits for tasks assessed                      General Comments      Exercises Other Exercises Other Exercises: pt ed on importance of attempting to move L LE and not remaining immobile.  pt ed on performing AROM.        Assessment/Plan    PT Assessment Patient needs continued PT services  PT Diagnosis Difficulty walking;Acute pain   PT Problem List Decreased range of motion;Decreased activity tolerance;Decreased balance;Decreased mobility;Decreased knowledge of use of DME;Pain  PT Treatment Interventions DME instruction;Gait  training;Functional mobility training;Therapeutic activities;Therapeutic exercise;Balance training;Patient/family education   PT Goals (Current goals can be found in the Care Plan section) Acute Rehab PT Goals Patient Stated Goal: Back to work.   PT Goal Formulation: With patient Time For Goal Achievement: 03/13/14 Potential to Achieve Goals: Good    Frequency Min 5X/week   Barriers to discharge        Co-evaluation               End of Session Equipment Utilized During Treatment: Gait belt;Oxygen Activity Tolerance: Patient limited by pain Patient left: in chair;with call [place]oz[place]/phone within reach Nurse Communication: Mobility status         Time: 8299-3716 PT Time Calculation (min) (ACUTE ONLY): 33 min   Charges:   PT Evaluation $Initial PT Evaluation Tier I: 1 Procedure PT Treatments $Therapeutic Activity: 23-37 mins   PT G CodesCatarina Hartshorn, [place] [phone] 03/06/2014, 9:43 AM

## 2014-03-06 NOTE — Consult Note (Signed)
Physical Medicine and Rehabilitation Consult Reason for Consult: Multitrauma after a fall Referring Physician: Trauma services   HPI: Roger Wilson is a 58 y.o. right handed male admitted 03/04/2014 after a fall from a roof approximately 10 feet with questionable loss of consciousness. Patient was independent prior to admission living with his wife. Complaints of left shoulder pain chest pain and left hip pain. Cranial CT scan showed a high left parietal scalp hematoma without skull fracture or intracranial abnormality. CT abdomen and pelvis showed a left iliac wing fracture with left superior and inferior pubic rami fracture as well as retroperitoneal hematoma, left small pneumothorax, left first rib fracture and left clavicle fracture. Follow-up orthopedic services Dr. Marcelino Scot and currently weightbearing as tolerated left lower extremity and shoulder sling for left distal clavicle fracture question weightbearing left upper extremity. CT 3-D of pelvis shows multiple fractures involving left iliac wing remaining external to the left acetabulum extending in a nondisplaced fracture plane into the left SI joint as well as fractures left superior and inferior pubic rami. Hospital course pain management. Physical and occupational therapy evaluations pending. M.D. has requested physical medicine rehabilitation consult.   Review of Systems  All other systems reviewed and are negative.  Past Medical History  Diagnosis Date  . Environmental allergies   . Hypertension    Past Surgical History  Procedure Laterality Date  . Knee arthroscopy  2002    left   Family History  Problem Relation Age of Onset  . Colon cancer Neg Hx   . Stomach cancer Neg Hx    Social History:  reports that he has never smoked. He has never used smokeless tobacco. He reports that he drinks about 4.2 oz of alcohol per week. He reports that he does not use illicit drugs. Allergies:  Allergies  Allergen Reactions  .  Morphine     Veins turn red after injection   Medications Prior to Admission  Medication Sig Dispense Refill  . Ascorbic Acid (VITAMIN C PO) Take by mouth daily.    Marland Kitchen aspirin 81 MG tablet Take 81 mg by mouth daily.    . Calcium Carbonate-Vitamin D (CALCIUM + D PO) Take by mouth daily.    . Cyanocobalamin (B-12 PO) Take by mouth daily.    . fish oil-omega-3 fatty acids 1000 MG capsule Take by mouth daily.    . Loratadine (CLARITIN PO) Take 10 mg by mouth daily.     . Multiple Vitamin (MULTIVITAMIN) tablet Take 1 tablet by mouth daily.      Home: Home Living Family/patient expects to be discharged to:: Private residence Living Arrangements: Spouse/significant other  Functional History:   Functional Status:  Mobility:          ADL:    Cognition: Cognition Orientation Level: Oriented X4    Blood pressure 135/79, pulse 76, temperature 97.9 F (36.6 C), temperature source Oral, resp. rate 20, height 5\' 11"  (1.803 m), weight 82.4 kg (181 lb 10.5 oz), SpO2 96 %. Physical Exam  Constitutional: He appears well-developed.  HENT:  Head: Normocephalic.  Eyes: EOM are normal.  Neck: Normal range of motion. Neck supple. No thyromegaly present.  Cardiovascular: Normal rate and regular rhythm.   Respiratory: Effort normal and breath sounds normal. No respiratory distress.  GI: Soft. Bowel sounds are normal. He exhibits no distension.  Neurological:  Patient appears a bit sedated but was cooperative with exam. He makes good eye contact with examiner. Oriented 3 and follows commands  Skin: Skin is warm and dry.    No results found for this or any previous visit (from the past 24 hour(s)). Ct Head Wo Contrast  03/04/2014   CLINICAL DATA:  [place] off of a roof.  Hit head.  EXAM: CT HEAD WITHOUT CONTRAST  CT CERVICAL SPINE WITHOUT CONTRAST  TECHNIQUE: Multidetector CT imaging of the head and cervical spine was performed following the standard protocol without intravenous contrast.  Multiplanar CT image reconstructions of the cervical spine were also generated.  COMPARISON:  Head CT from 2005.  FINDINGS: CT HEAD FINDINGS  There is a sizable left high parietal scalp hematoma without underlying skull fracture. No radiopaque foreign body.  No acute intracranial findings. No subdural hematoma, infarct or intracranial hemorrhage. The brainstem and cerebellum are unremarkable. 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  CT CERVICAL SPINE FINDINGS  Normal alignment of the cervical vertebral bodies. Disc spaces and vertebral bodies are maintained. No acute fracture or abnormal prevertebral soft tissue swelling. The facets are normally aligned. The skullbase C1 and C1-2 articulations are maintained. The dens is normal. No large disc protrusions, spinal or foraminal stenosis. The lung apices are clear. A small left apical pneumothorax is noted.  IMPRESSION: 1. High left parietal scalp hematoma without underlying skull fracture. 2. No acute intracranial findings. 3. Normal alignment of the cervical vertebral bodies and no acute cervical spine fracture. 4. Small left apical pneumothorax   Electronically Signed   By: Kalman Jewels M.D.   On: 03/04/2014 14:58   Ct Chest W Contrast  03/04/2014   CLINICAL DATA:  [place] off a roof. Pelvic fractures and left-sided pneumothorax.  EXAM: CT CHEST, ABDOMEN, AND PELVIS WITH CONTRAST  TECHNIQUE: Multidetector CT imaging of the chest, abdomen and pelvis was performed following the standard protocol during bolus administration of intravenous contrast.  CONTRAST:  80mL OMNIPAQUE IOHEXOL 300 MG/ML  SOLN  COMPARISON:  CT scan 12/21/2011.  FINDINGS: CT CHEST FINDINGS  Chest wall: There is a distal left clavicle fracture noted. The sternum is intact. No thoracic vertebral body fractures are identified. There are normally aligned. Suspect a subtle nondisplaced fracture involving the anterior aspect of the first left rib. This is best seen on the sagittal reformatted  images. On the right side there appears to be a small nondisplaced fracture near the first rib articulation with the sternum. There is an associated small substernal hematoma.  Mediastinum: The heart is normal in size. No pericardial effusion. There is a stable benign epicardial cyst on the right side. The aorta and branch vessels are patent. No dissection. No mediastinal hematoma, mass or adenopathy. The esophagus is grossly normal.  Lungs: There is a small left apical pneumothorax. Dependent bibasilar atelectasis but no pulmonary contusions. No pleural effusion.  CT ABDOMEN AND PELVIS FINDINGS  The solid abdominal organs are intact. No acute injury is identified. The gallbladder is normal. No common bowel duct dilatation. No mesenteric or retroperitoneal mass or hematoma.  The stomach, duodenum, small bowel and colon are grossly normal without oral contrast. No free air or free fluid.  The bladder, prostate gland and seminal vesicles are normal. No pelvic mass or intrapelvic hematoma or fluid.  There is a comminuted fracture involving the left iliac wing extending all the way down to just above the superior and anterior aspect of the acetabulum. It does not involve the acetabulum itself. Both hips are normally located. The pubic symphysis and SI joints are intact. There are superior and inferior pubic rami fractures on the  left side. There may be a small anterior sacral fracture on image number 104. Associated hematoma is noted in the iliacus muscle and gluteus minimus muscles.  The lumbar vertebral bodies are normally aligned. No transverse process fractures.  IMPRESSION: 1. Small left-sided pneumothorax. No pulmonary contusion or displaced rib fractures. 2. Distal left clavicle fracture and nondisplaced subtle first left rib fracture anteriorly. 3. Suspect a small fracture near the first rib articulation with the sternum with an associated small substernal hematoma. 4. Normal heart and great vessels. 5. Intact  solid abdominal organs. 6. Comminuted fracture involving the left iliac wing. No involvement of the left acetabulum or left SI joint. 7. Superior and inferior pubic rami fractures on the left. The pubic symphysis is intact. 8. Moderate-sized hematoma involving the left iliacus muscle and gluteus minimus muscle.   Electronically Signed   By: Kalman Jewels M.D.   On: 03/04/2014 15:11   Ct Cervical Spine Wo Contrast  03/04/2014   CLINICAL DATA:  [place] off of a roof.  Hit head.  EXAM: CT HEAD WITHOUT CONTRAST  CT CERVICAL SPINE WITHOUT CONTRAST  TECHNIQUE: Multidetector CT imaging of the head and cervical spine was performed following the standard protocol without intravenous contrast. Multiplanar CT image reconstructions of the cervical spine were also generated.  COMPARISON:  Head CT from 2005.  FINDINGS: CT HEAD FINDINGS  There is a sizable left high parietal scalp hematoma without underlying skull fracture. No radiopaque foreign body.  No acute intracranial findings. No subdural hematoma, infarct or intracranial hemorrhage. The brainstem and cerebellum are unremarkable. 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  CT CERVICAL SPINE FINDINGS  Normal alignment of the cervical vertebral bodies. Disc spaces and vertebral bodies are maintained. No acute fracture or abnormal prevertebral soft tissue swelling. The facets are normally aligned. The skullbase C1 and C1-2 articulations are maintained. The dens is normal. No large disc protrusions, spinal or foraminal stenosis. The lung apices are clear. A small left apical pneumothorax is noted.  IMPRESSION: 1. High left parietal scalp hematoma without underlying skull fracture. 2. No acute intracranial findings. 3. Normal alignment of the cervical vertebral bodies and no acute cervical spine fracture. 4. Small left apical pneumothorax   Electronically Signed   By: Kalman Jewels M.D.   On: 03/04/2014 14:58   Ct Abdomen Pelvis W Contrast  03/04/2014   CLINICAL DATA:   [place] off a roof. Pelvic fractures and left-sided pneumothorax.  EXAM: CT CHEST, ABDOMEN, AND PELVIS WITH CONTRAST  TECHNIQUE: Multidetector CT imaging of the chest, abdomen and pelvis was performed following the standard protocol during bolus administration of intravenous contrast.  CONTRAST:  34mL OMNIPAQUE IOHEXOL 300 MG/ML  SOLN  COMPARISON:  CT scan 12/21/2011.  FINDINGS: CT CHEST FINDINGS  Chest wall: There is a distal left clavicle fracture noted. The sternum is intact. No thoracic vertebral body fractures are identified. There are normally aligned. Suspect a subtle nondisplaced fracture involving the anterior aspect of the first left rib. This is best seen on the sagittal reformatted images. On the right side there appears to be a small nondisplaced fracture near the first rib articulation with the sternum. There is an associated small substernal hematoma.  Mediastinum: The heart is normal in size. No pericardial effusion. There is a stable benign epicardial cyst on the right side. The aorta and branch vessels are patent. No dissection. No mediastinal hematoma, mass or adenopathy. The esophagus is grossly normal.  Lungs: There is a small left apical pneumothorax. Dependent bibasilar atelectasis  but no pulmonary contusions. No pleural effusion.  CT ABDOMEN AND PELVIS FINDINGS  The solid abdominal organs are intact. No acute injury is identified. The gallbladder is normal. No common bowel duct dilatation. No mesenteric or retroperitoneal mass or hematoma.  The stomach, duodenum, small bowel and colon are grossly normal without oral contrast. No free air or free fluid.  The bladder, prostate gland and seminal vesicles are normal. No pelvic mass or intrapelvic hematoma or fluid.  There is a comminuted fracture involving the left iliac wing extending all the way down to just above the superior and anterior aspect of the acetabulum. It does not involve the acetabulum itself. Both hips are normally located. The  pubic symphysis and SI joints are intact. There are superior and inferior pubic rami fractures on the left side. There may be a small anterior sacral fracture on image number 104. Associated hematoma is noted in the iliacus muscle and gluteus minimus muscles.  The lumbar vertebral bodies are normally aligned. No transverse process fractures.  IMPRESSION: 1. Small left-sided pneumothorax. No pulmonary contusion or displaced rib fractures. 2. Distal left clavicle fracture and nondisplaced subtle first left rib fracture anteriorly. 3. Suspect a small fracture near the first rib articulation with the sternum with an associated small substernal hematoma. 4. Normal heart and great vessels. 5. Intact solid abdominal organs. 6. Comminuted fracture involving the left iliac wing. No involvement of the left acetabulum or left SI joint. 7. Superior and inferior pubic rami fractures on the left. The pubic symphysis is intact. 8. Moderate-sized hematoma involving the left iliacus muscle and gluteus minimus muscle.   Electronically Signed   By: Kalman Jewels M.D.   On: 03/04/2014 15:11   Dg Pelvis Portable  03/04/2014   ADDENDUM REPORT: 03/04/2014 14:54  ADDENDUM: Small left apical pneumothorax is suspected.   Electronically Signed   By: Kalman Jewels M.D.   On: 03/04/2014 14:54   03/04/2014   CLINICAL DATA:  [place] off a roof.  EXAM: PORTABLE CHEST - 1 VIEW; PORTABLE PELVIS 1-2 VIEWS  COMPARISON:  06/07/2003.  FINDINGS: The cardiac silhouette, mediastinal and hilar contours are within normal limits given the AP projection of the film and the supine position of the patient. No mediastinal widening or apical pleural cap. The lungs are grossly clear. No pleural effusion or pneumothorax. I do not see any definite rib fractures. Remote healed right clavicle fracture is noted. Could not exclude the possibility of a distal left clavicle fracture but recommend correlation with pain and tenderness in this area.  IMPRESSION: No  acute cardiopulmonary findings.  Intact bony thorax. Could not exclude the possibility of a distal left clavicle fracture.  Electronically Signed: By: Kalman Jewels M.D. On: 03/04/2014 14:04   Dg Pelvis Comp Min 3v  03/05/2014   CLINICAL DATA:  Fall off roof yesterday, pelvic fracture, left side pain  EXAM: JUDET PELVIS - 3+ VIEW  COMPARISON:  03/04/2014  FINDINGS: Three views of the pelvis submitted including Judet view. Again noted Mild displaced comminuted fracture of the left iliac wing. There is mild displaced fracture of left inferior pubic ramus. Nondisplaced fracture of the left superior pubic ramus. Again noted a transverse fracture line in left iliac bone progressing to left SI joint.  IMPRESSION: Again noted Mild displaced post traumatic comminuted fracture of the left iliac wing. There is mild displaced fracture of left inferior pubic ramus. Nondisplaced fracture of the left superior pubic ramus. Again noted a transverse fracture line in left iliac  bone progressing to left SI joint.   Electronically Signed   By: Lahoma Crocker M.D.   On: 03/05/2014 09:46   Dg Hand 2 View Right  03/04/2014   CLINICAL DATA:  Pt fell off of a ladder today and has pain in the 2nd digit in the right hand that travels through the hand  EXAM: RIGHT HAND - 2 VIEW  COMPARISON:  None.  FINDINGS: No acute fracture.  No dislocation.  There is a well corticated bone fragment medial to the PIP joint of the fourth finger. This has a chronic appearance.  There are minor osteoarthritic changes with small marginal osteophytes and slight asymmetric joint space narrowing of several of the interphalangeal joints. Mild arthropathic changes are noted of the second and third metacarpophalangeal joints.  Soft tissues are unremarkable.  No radiopaque foreign body.  IMPRESSION: No fracture or dislocation or acute finding.   Electronically Signed   By: Lajean Manes M.D.   On: 03/04/2014 16:35   Ct 3d Independent Darreld Mclean  03/04/2014    CLINICAL DATA:  Nonspecific (abnormal) findings on radiological and other examination of musculoskeletal sysem. Patient fell off roof, pelvic fractures  EXAM: 3-DIMENSIONAL CT IMAGE RENDERING ON INDEPENDENT WORKSTATION  TECHNIQUE: 3-dimensional CT images were rendered by post-processing of the original CT data on an independent workstation. The 3-dimensional CT images were interpreted and findings were reported in the accompanying complete CT report for this study  COMPARISON:  CT chest abdomen and pelvis 03/04/2014  FINDINGS: Comminuted mildly displaced fracture involving the LEFT iliac wing, with fracture planes coursing superior and anterior to the acetabulum without discrete acetabular involvement.  An additional oblique fracture plane extends through superior LEFT iliac wing into LEFT SI joint.  Nondisplaced fracture LEFT superior pubic ramus.  Minimally displaced fracture inferior LEFT pubic ramus.  Hips normally located bilaterally.  SI joints appear symmetric.  Subtle anterior LEFT sacral fracture on axial images is not definitely visualized on the 3D reconstructions.  RIGHT pelvis appears intact.  L5 vertebra appears intact and normally aligned.  IMPRESSION: Multiple fracture planes involving the LEFT iliac wing remaining external to the LEFT acetabulum but extending in a nondisplaced fracture plane into the LEFT SI joint.  Fractures of LEFT superior and inferior pubic rami.   Electronically Signed   By: Lavonia Dana M.D.   On: 03/04/2014 18:58   Dg Chest Port 1 View  03/05/2014   CLINICAL DATA:  58 year old male with left pneumothorax status post fall from roof. Initial encounter.  EXAM: PORTABLE CHEST - 1 VIEW  COMPARISON:  Chest CT 03/04/2014 and earlier.  FINDINGS: Portable AP semi upright view at 0536 hrs. Small left pneumothorax has not significantly changed. Minimally displaced distal left clavicle fracture re- identified. Lower lung volumes. Stable cardiac size and mediastinal contours. No  pulmonary edema, pleural effusion or pulmonary contusion identified. Visualized tracheal air column is within normal limits.  IMPRESSION: 1. Stable small left pneumothorax. 2. No new cardiopulmonary abnormality.   Electronically Signed   By: Lars Pinks M.D.   On: 03/05/2014 07:14   Dg Chest Port 1 View  03/04/2014   ADDENDUM REPORT: 03/04/2014 14:54  ADDENDUM: Small left apical pneumothorax is suspected.   Electronically Signed   By: Kalman Jewels M.D.   On: 03/04/2014 14:54   03/04/2014   CLINICAL DATA:  [place] off a roof.  EXAM: PORTABLE CHEST - 1 VIEW; PORTABLE PELVIS 1-2 VIEWS  COMPARISON:  06/07/2003.  FINDINGS: The cardiac silhouette, mediastinal and hilar contours  are within normal limits given the AP projection of the film and the supine position of the patient. No mediastinal widening or apical pleural cap. The lungs are grossly clear. No pleural effusion or pneumothorax. I do not see any definite rib fractures. Remote healed right clavicle fracture is noted. Could not exclude the possibility of a distal left clavicle fracture but recommend correlation with pain and tenderness in this area.  IMPRESSION: No acute cardiopulmonary findings.  Intact bony thorax. Could not exclude the possibility of a distal left clavicle fracture.  Electronically Signed: By: Kalman Jewels M.D. On: 03/04/2014 14:04    Assessment/Plan: Diagnosis: multiple pelvic fractures/polytrauma after fall 1. Does the need for close, 24 hr/day medical supervision in concert with the patient's rehab needs make it unreasonable for this patient to be served in a less intensive setting? Yes 2. Co-Morbidities requiring supervision/potential complications:  Pain mgt 3. Due to bladder management, bowel management, safety, skin/wound care, disease management, medication administration, pain management and patient education, does the patient require 24 hr/day rehab nursing? Yes 4. Does the patient require coordinated care of a  physician, rehab nurse, PT (1-2 hrs/day, 5 days/week) and OT (1-2 hrs/day, 5 days/week) to address physical and functional deficits in the context of the above medical diagnosis(es)? Yes Addressing deficits in the following areas: balance, endurance, locomotion, strength, transferring, bowel/bladder control, bathing, dressing, feeding, grooming and toileting 5. Can the patient actively participate in an intensive therapy program of at least 3 hrs of therapy per day at least 5 days per week? Yes 6. The potential for patient to make measurable gains while on inpatient rehab is excellent 7. Anticipated functional outcomes upon discharge from inpatient rehab are modified independent, supervision and min assist  with PT, modified independent, supervision and min assist with OT, n/a with SLP. 8. Estimated rehab length of stay to reach the above functional goals is: potentially 8-14 days 9. Does the patient have adequate social supports and living environment to accommodate these discharge functional goals? Yes 10. Anticipated D/C setting: Home 11. Anticipated post D/C treatments: [facility] 12. Overall Rehab/Functional Prognosis: excellent  RECOMMENDATIONS: This patient's condition is appropriate for continued rehabilitative care in the following setting: CIR Patient has agreed to participate in recommended program. Potentially Note that insurance prior authorization may be required for reimbursement for recommended care.  Comment: Therapy evaluations pending. Rehab Admissions Coordinator to follow up.  Thanks,  Meredith Staggers, MD, Mellody Drown     03/06/2014

## 2014-03-06 NOTE — Progress Notes (Signed)
Pt unable to void again. Bladder scan revealed 993. I/O cath 1224ml. Will continue to monitor.

## 2014-03-06 NOTE — Progress Notes (Signed)
Rehab admissions - I met with patient and his wife at the bedside.  I gave them inpatient rehab booklets and explained the process to them.  They are interested in acute inpatient rehab admission.  I await the OT eval.  I will open the case with [employer] and seek inpatient rehab authorization.  I will follow up after I hear back from insurance carrier.  Call me for questions.  #[phone]

## 2014-03-07 ENCOUNTER — Observation Stay (HOSPITAL_COMMUNITY): Payer: BC Managed Care – PPO

## 2014-03-07 DIAGNOSIS — W132XXA Fall from, out of or through roof, initial encounter: Secondary | ICD-10-CM | POA: Diagnosis present

## 2014-03-07 DIAGNOSIS — S270XXA Traumatic pneumothorax, initial encounter: Secondary | ICD-10-CM | POA: Diagnosis present

## 2014-03-07 DIAGNOSIS — S2232XA Fracture of one rib, left side, initial encounter for closed fracture: Secondary | ICD-10-CM | POA: Diagnosis present

## 2014-03-07 DIAGNOSIS — R338 Other retention of urine: Secondary | ICD-10-CM | POA: Diagnosis not present

## 2014-03-07 DIAGNOSIS — S32592A Other specified fracture of left pubis, initial encounter for closed fracture: Secondary | ICD-10-CM | POA: Diagnosis present

## 2014-03-07 DIAGNOSIS — S32302A Unspecified fracture of left ilium, initial encounter for closed fracture: Secondary | ICD-10-CM | POA: Diagnosis present

## 2014-03-07 LAB — BASIC METABOLIC PANEL
Anion gap: 11 (ref 5–15)
BUN: 10 mg/dL (ref 6–23)
CO2: 25 meq/L (ref 19–32)
CREATININE: 0.78 mg/dL (ref 0.50–1.35)
Calcium: 8.1 mg/dL — ABNORMAL LOW (ref 8.4–10.5)
Chloride: 103 mEq/L (ref 96–112)
GFR calc Af Amer: >90 mL/min (ref >90)
GLUCOSE: 93 mg/dL (ref 70–99)
Potassium: 4.3 mEq/L (ref 3.7–5.3)
SODIUM: 139 meq/L (ref 137–147)

## 2014-03-07 LAB — CBC
HEMATOCRIT: 34.4 % — AB (ref 39.0–52.0)
Hemoglobin: 12.3 g/dL — ABNORMAL LOW (ref 13.0–17.0)
MCH: 30.4 pg (ref 26.0–34.0)
MCHC: 35.8 g/dL (ref 30.0–36.0)
MCV: 85.1 fL (ref 78.0–100.0)
Platelets: 105 10*3/uL — ABNORMAL LOW (ref 150–400)
RBC: 4.04 MIL/uL — AB (ref 4.22–5.81)
RDW: 12.3 % (ref 11.5–15.5)
WBC: 5.5 10*3/uL (ref 4.0–10.5)

## 2014-03-07 MED ORDER — METHOCARBAMOL 500 MG PO TABS
1000.0000 mg | ORAL_TABLET | Freq: Three times a day (TID) | ORAL | Status: DC
  Administered 2014-03-07 – 2014-03-08 (×4): 1000 mg via ORAL
  Filled 2014-03-07 (×6): qty 2

## 2014-03-07 MED ORDER — HYDROMORPHONE HCL 1 MG/ML IJ SOLN: 0.5000 mg | INTRAMUSCULAR | Status: DC | PRN

## 2014-03-07 NOTE — Plan of Care (Signed)
Problem: Acute Rehab OT Goals (only OT should resolve) Goal: Pt. Will Perform Tub/Shower Transfer Outcome: Not Applicable Date Met:  02/12/24

## 2014-03-07 NOTE — Progress Notes (Signed)
Subjective: Pain   Objective: Vital signs in last 24 hours: Temp:  [97.9 F (36.6 C)-99.3 F (37.4 C)] 97.9 F (36.6 C) (11/20 1041) Pulse Rate:  [60-70] 67 (11/20 1041) Resp:  [13-18] 16 (11/20 1236) BP: (110-138)/(67-82) 131/68 mmHg (11/20 1041) SpO2:  [87 %-98 %] 94 % (11/20 1236)  Intake/Output from previous day: 11/19 0701 - 11/20 0700 In: 2633 [P.O.:240; I.V.:1050] Out: 3075 [Urine:3075] Intake/Output this shift: Total I/O In: 240 [P.O.:240] Out: -    Recent Labs  03/04/14 1357 03/05/14 0240 03/07/14 0545  HGB 14.6 14.3 12.3*    Recent Labs  03/05/14 0240 03/07/14 0545  WBC 8.4 5.5  RBC 4.55 4.04*  HCT 39.1 34.4*  PLT 123* 105*    Recent Labs  03/05/14 0240 03/07/14 0545  NA 134* 139  K 4.1 4.3  CL 100 103  CO2 24 25  BUN 10 10  CREATININE 0.80 0.78  GLUCOSE 113* 93  CALCIUM 8.0* 8.1*   No results for input(s): LABPT, INR in the last 72 hours.  Neurologically intact Sensation intact distally Dorsiflexion/Plantar flexion intact Compartment soft Xray pelvis no change. Assessment/Plan: [place] fracture. Continue nonop management. Rehab pending. F/U 2 weeks with Dr. Marcelino Scot or myself for xrays.   Braysen Cloward C 03/07/2014, 1:30 PM

## 2014-03-07 NOTE — PMR Pre-admission (Signed)
PMR Admission Coordinator Pre-Admission Assessment  Patient: Roger Wilson is an 58 y.o., male MRN: [id number] DOB: 31-Jul-1955 Height: 5\' 11"  (180.3 cm) Weight: 80.831 kg (178 lb 3.2 oz)              Insurance Information HMO:      PPO: Yes     PCP:       IPA:       80/20:       OTHER:  Group # [id number] PRIMARY: [employer] [place]      Policy#: [id number]      Subscriber: Henrene Dodge CM Name: Any RN      Phone#: [phone]     Fax#: [phone] Pre-Cert#: [id number] from 11/21 to 11/27 with update due 03/14/14      Employer: Works [employer] Benefits:  Phone #: [phone]     Name: Elton Sin. Date: 05/18/13     Deduct: $5000 ($3361.86 remains)      Out of Pocket Max: $5000 ($3250.17 remains)      Life Max: unlimited CIR: 80% w/auth      SNF: 80% w/auth Outpatient: 80%     Co-Pay: 20% Home Health: 80%      Co-Pay: 20% DME: 80%     Co-Pay: 20% Providers: in network  Emergency [place]    Name Relation Home Work Mobile   [place] Spouse [phone]       Current Medical History  Patient Admitting Diagnosis: Multiple pelvic fractures/polytrauma after fall   History of Present Illness: A 58 y.o. right handed male admitted 03/04/2014 after a fall from a roof approximately 10 feet with questionable loss of consciousness. Patient was independent prior to admission living with his wife. Complaints of left shoulder pain, chest pain and left hip pain. Cranial CT scan showed a high left parietal scalp hematoma without skull fracture or intracranial abnormality. CT abdomen and pelvis showed a left iliac wing fracture with left superior and inferior pubic rami fracture as well as retroperitoneal hematoma, left small pneumothorax, left first rib fracture and left clavicle fracture. Follow-up orthopedic services Dr. Marcelino Scot and currently weightbearing as tolerated left lower extremity and shoulder sling for left distal clavicle fracture question weightbearing left upper extremity.  CT 3-D of pelvis shows multiple fractures involving left iliac wing remaining external to the left acetabulum extending in a nondisplaced fracture plane into the left SI joint as well as fractures left superior and inferior pubic rami. Hospital course pain management. Subcutaneous Lovenox added for DVT prophylaxis. Bouts of urinary retention with foley catheter in place and currently maintained on urecholine. Physical therapy evaluation completed with recommendations of physical medicine rehabilitation consult.. Patient to be admitted for comprehensive inpatient rehabilitation program.     Past Medical History  Past Medical History  Diagnosis Date  . Environmental allergies   . Hypertension     Family History  family history is negative for Colon cancer and Stomach cancer.  Prior Rehab/Hospitalizations:  Had outpatient therapy 2 yrs ago after ankle fx with [facility] orthopedics.   Current Medications  Current facility-administered medications: 0.9 % NaCl with KCl 20 mEq/ L  infusion, , Intravenous, Continuous, Doreen Salvage, MD, Last Rate: 50 mL/hr at 03/07/14 1240;  bethanechol (URECHOLINE) tablet 25 mg, 25 mg, Oral, TID, Georganna Skeans, MD, 25 mg at 03/07/14 0913;  bisacodyl (DULCOLAX) suppository 10 mg, 10 mg, Rectal, Daily PRN, Megan N Dort, PA-C docusate sodium (COLACE) capsule 100 mg, 100 mg, Oral, BID, Megan N Dort, PA-C, 100 mg  at 03/07/14 0914;  enoxaparin (LOVENOX) injection 40 mg, 40 mg, Subcutaneous, Daily, Georganna Skeans, MD, 40 mg at 03/07/14 0914;  HYDROmorphone (DILAUDID) injection 0.5-1 mg, 0.5-1 mg, Intravenous, Q3H PRN, Megan N Dort, PA-C;  ketorolac (TORADOL) 15 MG/ML injection 15 mg, 15 mg, Intravenous, 3 times per day, Georganna Skeans, MD, 15 mg at 03/07/14 1445 loratadine (CLARITIN) tablet 10 mg, 10 mg, Oral, Daily, Doreen Salvage, MD, 10 mg at 03/07/14 0913;  methocarbamol (ROBAXIN) tablet 1,000 mg, 1,000 mg, Oral, TID, Megan N Dort, PA-C, 1,000 mg at 03/07/14 0913;  ondansetron  (ZOFRAN) injection 4 mg, 4 mg, Intravenous, Q6H PRN, Megan N Dort, PA-C, 4 mg at 03/04/14 1511;  oxyCODONE (Oxy IR/ROXICODONE) immediate release tablet 10 mg, 10 mg, Oral, Q4H PRN, Doreen Salvage, MD, 10 mg at 03/07/14 0913 pantoprazole (PROTONIX) EC tablet 40 mg, 40 mg, Oral, Daily, 40 mg at 03/07/14 0913 **OR** [DISCONTINUED] pantoprazole (PROTONIX) injection 40 mg, 40 mg, Intravenous, Daily, Megan N Dort, PA-C;  tamsulosin (FLOMAX) capsule 0.4 mg, 0.4 mg, Oral, Daily, Georganna Skeans, MD, 0.4 mg at 03/07/14 0981  Patients Current Diet: Diet regular  Precautions / Restrictions Precautions Precautions: Fall Restrictions Weight Bearing Restrictions: No   Prior Activity Level Community (5-7x/wk): Went out daily.  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   Home Assistive Devices / Equipment Home Assistive Devices/Equipment: Eyeglasses  Prior Functional Level Prior Function Level of Independence: Independent Comments: pt works as Programme researcher, broadcasting/film/video.  Current Functional Level Cognition  Overall Cognitive Status: Within Functional Limits for tasks assessed Orientation Level: Oriented X4    Extremity Assessment (includes Sensation/Coordination)  Upper Extremity Assessment: Defer to OT evaluation Lower Extremity Assessment: LLE deficits/detail LLE Deficits / Details: Limited by pain in L hip.  Cervical / Trunk Assessment: Normal   ADLs  Overall ADL's : Needs assistance/impaired Eating/Feeding: Sitting, Set up Eating/Feeding Details (indicate cue type and reason): needs (A) with opening tight containers due to LUE shoulder pain  Grooming: Oral care, Wash/dry face, Min guard, Standing Grooming Details (indicate cue type and reason): pt is able to remove 1 hand from RW during static standing to perform oral care and wash face with cloth Upper Body Bathing: Sitting, Min guard Upper Body Bathing Details (indicate cue type and reason): pt able to complete without assistance including  underneath both arms, though could need assistance if increased LUE pain to reach across to RUE Lower Body Bathing: Sit to/from stand, Moderate assistance Lower Body Bathing Details (indicate cue type and reason): (A) for Bil LEs from knees down due to pt inability to lean forward to reach (limited by hip pain). Pt able to wash groin and buttocks in standing.  Upper Body Dressing : Min guard, Sitting Lower Body Dressing: Maximal assistance, Sit to/from stand Lower Body Dressing Details (indicate cue type and reason): (A) to thread Bil LEs and for socks and shoes.  Toilet Transfer: Min guard, Stand-pivot, RW (bed>recliner ) Toileting- Water quality scientist and Hygiene: Min guard, Sit to/from stand Toileting - Clothing Manipulation Details (indicate cue type and reason): pt has foley cath, however demonstrated ability to perform pericare in standing during LB bathing. Pt can use UEs to manage clothing.  Functional mobility during ADLs: Min guard, Rolling walker General ADL Comments: Pt with improved pain control due to premedication and able to participate fully in PT/OT session. Pt ambulated in room with +2 for safety, however only required Min guard assist. Pt performed sponge bath with (A) for Bil LEs and performed pericare in standing.  Pt would be an excellent CIR candidate, however, due to limitations of mobility and home setup.     Mobility  Overal bed mobility: Needs Assistance, +2 for physical assistance Bed Mobility: Rolling, Sidelying to Sit Rolling: Supervision Sidelying to sit: HOB elevated, Max assist, +2 for physical assistance Supine to sit: Max assist, +2 for physical assistance, HOB elevated General bed mobility comments: Pt able to mobilize BLEs to EOB with increased time and cues. Pt given belt to assist moving LLE to EOB, required Min A to assist with LLE movement off bed. Max A with trunk to get to seated position.    Transfers  Overall transfer level: Needs  assistance Equipment used: Rolling walker (2 wheeled) Transfers: Sit to/from Stand Sit to Stand: Min guard Stand pivot transfers: Max assist, +2 physical assistance General transfer comment: Min guard for safety/balance. VC's for hand placement and technique. Cues to reach back upon descent into chair.    Ambulation / Gait / Stairs / Wheelchair Mobility  Ambulation/Gait Ambulation/Gait assistance: Physicist, medical (Feet): 20 Feet Assistive device: Rolling walker (2 wheeled) Gait Pattern/deviations: Step-to pattern, Decreased stride length, Decreased stance time - left, Decreased step length - right, Trunk flexed Gait velocity: slow Gait velocity interpretation: Below normal speed for age/gender General Gait Details: Vc's for increased WB through BUEs to help relieve pain through LLE with WB. VC's for upright posture.     Posture / Balance Dynamic Sitting Balance Sitting balance - Comments: Able to sit EOB without UE support. Able to scoot along side bed x2 for better positioning without LOB.    Special needs/care consideration BiPAP/CPAP No CPM No Continuous Drip IV 0.9% NS with KCL 20 meq/L 50 ml/hr Dialysis No         Life Vest No Oxygen Not at home.  Currently on O2 since on Dilaudid PCA Special Bed No Trach Size No Wound Vac (area) No      Skin Has scalp hematoma; sling to L arm when up; bruises on left chest.                             Bowel mgmt: No BM since 03/04/14 Bladder mgmt: Foley catheter for urinary retention Diabetic mgmt No    Previous Home Environment Living Arrangements: Spouse/significant other Home Care Services: No  Discharge Living Setting Plans for Discharge Living Setting: Patient's home, House, Lives with (comment) (Lives with wife.) Type of Home at Discharge: House Discharge Home Layout: One level (1 step up from sunroom to main area of house.) Discharge Home Access: Stairs to enter Entrance Stairs-Number of Steps: 3-4 steps at back  and front entry Does the patient have any problems obtaining your medications?: No  Social/Family/Support Systems Patient Roles: Spouse, Parent (Has a wife, daughter and 2 brothers.) Contact Information: Kionte Baumgardner - wife [phone] Anticipated Caregiver: self, wife and brothers potentially Ability/Limitations of Caregiver: Wife works 8:30 to 6 pm or later.  Wife can work some from home.  Has 2 brothers who may be able to assist while wife works. Caregiver Availability: Other (Comment) (Will not have 24/7 assistance.) Discharge Plan Discussed with Primary Caregiver: Yes Is Caregiver In Agreement with Plan?: Yes Does Caregiver/Family have Issues with Lodging/Transportation while Pt is in Rehab?: No  Goals/Additional Needs Patient/Family Goal for Rehab: PT/OT mod I to min assist goals Expected length of stay: 8-14 days Cultural Considerations: Attends church on Wed and Sun at [place] Needs: Regular  diet, thin liquids Equipment Needs: TBD Pt/Family Agrees to Admission and willing to participate: Yes Program Orientation Provided & Reviewed with Pt/Caregiver Including Roles  & Responsibilities: Yes  Decrease burden of Care through IP rehab admission: N/A  Possible need for SNF placement upon discharge: Not planned  Patient Condition: This patient's condition remains as documented in the consult dated 03/06/14, in which the Rehabilitation Physician determined and documented that the patient's condition is appropriate for intensive rehabilitative care in an inpatient rehabilitation facility. Will admit to inpatient rehab tomorrow, Saturday, 03/08/14.  Preadmission Screen Completed By:  Retta Diones, 03/07/2014 3:49 PM ______________________________________________________________________   Discussed status with Dr. Naaman Plummer on 03/07/14 at 1417 and received telephone approval for admission tomorrow, Saturday, 03/08/14.  Admission Coordinator:  Retta Diones,  time1549/Date11/20/15

## 2014-03-07 NOTE — Progress Notes (Addendum)
Physical Therapy Treatment Patient Details Name: Roger Wilson MRN: [id number] DOB: 26-Sep-1955 Today's Date: 03/07/2014    History of Present Illness pt sustained a 2 story fall while cleaning gutters resulting in L distal Clavicle fx, L 1st Rib fx, L pelvic fxs, and L PTX.      PT Comments    Patient progressing well with mobility. Increased time to perform all transfers especially bed mobility due to pain with all movement. Max A to get to EOB with multiple cues for technique. Pain more controlled today. Tolerated standing and short distance ambulation with assist. Recommending abdominal binder for compression to assist with pain control in pelvis. Will continue to follow and progress as tolerated. Appropriate for CIR as pt motivated to return to PLOF.   Follow Up Recommendations  CIR     Equipment Recommendations  Rolling walker with 5" wheels    Recommendations for Other Services Rehab consult     Precautions / Restrictions Precautions Precautions: Fall Restrictions Weight Bearing Restrictions: No    Mobility  Bed Mobility Overal bed mobility: Needs Assistance;+2 for physical assistance Bed Mobility: Rolling;Sidelying to Sit Rolling: Supervision Sidelying to sit: HOB elevated;Max assist;+2 for physical assistance       General bed mobility comments: Pt able to mobilize BLEs to EOB with increased time and cues. Pt given belt to assist moving LLE to EOB, required Min A to assist with LLE movement off bed. Max A with trunk to get to seated position.  Transfers Overall transfer level: Needs assistance Equipment used: Rolling walker (2 wheeled) Transfers: Sit to/from Stand Sit to Stand: Min guard         General transfer comment: Min guard for safety/balance. VC's for hand placement and technique. Cues to reach back upon descent into chair.  Ambulation/Gait Ambulation/Gait assistance: Min guard Ambulation Distance (Feet): 20 Feet Assistive device: Rolling walker  (2 wheeled) Gait Pattern/deviations: Step-to pattern;Decreased stride length;Decreased stance time - left;Decreased step length - right;Trunk flexed Gait velocity: slow Gait velocity interpretation: Below normal speed for age/gender General Gait Details: Vc's for increased WB through BUEs to help relieve pain through LLE with WB. VC's for upright posture.    Stairs            Wheelchair Mobility    Modified Rankin (Stroke Patients Only)       Balance Overall balance assessment: Needs assistance Sitting-balance support: Feet supported;No upper extremity supported Sitting balance-Leahy Scale: Good Sitting balance - Comments: Able to sit EOB without UE support. Able to scoot along side bed x2 for better positioning without LOB.   Standing balance support: During functional activity;Bilateral upper extremity supported Standing balance-Leahy Scale: Poor Standing balance comment: Requires BUE support for static and dynamic standing for balance/safety and pain control.                    Cognition Arousal/Alertness: Awake/alert Behavior During Therapy: WFL for tasks assessed/performed Overall Cognitive Status: Within Functional Limits for tasks assessed                      Exercises      General Comments        Pertinent Vitals/Pain Pain Assessment: 0-10 Pain Score: 3  (at beginning of session. Pain increased at end of session, no number given) Pain Location: L hip worse than L shoulder Pain Descriptors / Indicators: Sore;Sharp Pain Intervention(s): Monitored during session;Repositioned;Premedicated before session;PCA encouraged    Home Living Family/patient expects to be discharged  to:: Inpatient rehab                    Prior Function            PT Goals (current goals can now be found in the care plan section) Progress towards PT goals: Progressing toward goals    Frequency  Min 5X/week    PT Plan Current plan remains appropriate     Co-evaluation PT/OT/SLP Co-Evaluation/Treatment: Yes Reason for Co-Treatment: For patient/therapist safety PT goals addressed during session: Mobility/safety with mobility;Balance       End of Session Equipment Utilized During Treatment: Gait belt Activity Tolerance: Patient tolerated treatment well Patient left: in chair;with call [place]/phone within reach;with family/visitor present (with OT present.)     Time: 5400-8676 PT Time Calculation (min) (ACUTE ONLY): 26 min  Charges:  $Gait Training: 8-22 mins $Therapeutic Activity: 8-22 mins                    G Codes:  Functional Assessment Tool Used: Clinical judgement Functional Limitation: Mobility: Walking and moving around Mobility: Walking and Moving Around Current Status (P9509): At least 80 percent but less than 100 percent impaired, limited or restricted Mobility: Walking and Moving Around Goal Status [phone]): At least 1 percent but less than 20 percent impaired, limited or restricted   Candy Sledge A 03/07/2014, 11:27 AM Candy Sledge, PT, DPT [phone]

## 2014-03-07 NOTE — Progress Notes (Signed)
Rehab admissions - I have faxed the OT eval and updated notes to [place] [place] insurance carrier.  Awaiting response from case manager regarding possible inpatient rehab admission.  Call me for questions.  #[phone]

## 2014-03-07 NOTE — Progress Notes (Signed)
Insurance has approved CIR.  Rehab will admit the patient tomorrow when their bed opens up.  Coralie Keens, PA-C General [facility] [phone]

## 2014-03-07 NOTE — Progress Notes (Signed)
OT Cancellation Note  Patient Details Name: Roger Wilson MRN: [id number] DOB: 11-30-1955   Cancelled Treatment:    Reason Eval/Treat Not Completed: Patient at procedure or test/ unavailable. Pt currently at Xray. OT will follow up as soon as possible when pt returns to assist with d/c planning (potentially CIR).   [place] Herb M   Cyndie Chime, OTR/L Occupational Therapist [phone] (pager)  03/07/2014, 10:17 AM

## 2014-03-07 NOTE — Discharge Summary (Signed)
[facility] Trauma Service Discharge Summary   Patient ID: Roger Wilson MRN: [id number] DOB/AGE: 12/31/1955 58 y.o.  Admit date: 03/04/2014 Discharge date: 03/08/2014  Discharge Diagnoses Patient Active Problem List   Diagnosis Date Noted  . Acute urinary retention 03/07/2014  . Fall from roof 03/07/2014  . Left rib fracture 03/07/2014  . Pneumothorax, traumatic 03/07/2014  . Fracture of left iliac wing 03/07/2014  . Closed fracture of ramus of left pubis 03/07/2014  . Fall 03/04/2014  . CELLULITIS, HAND, LEFT 12/17/2009  . RESIDUAL FOREIGN BODY IN SOFT TISSUE 10/21/2009    Consultants Dr. Tonita Cong (Ortho Dr. Marcelino Scot (Ortho) Dr. Dava Najjar (Rehab)  Procedures None  Hospital Course:  58 y/o white male who presented to [facility] as a level 2 trauma who was status post a fall from a roof on 03/04/14. He fell from one roof approximately 10 feet to a second roof and then 10 more feet to the ground onto pavement. There was questionable loss of consciousness. He is complaining of left shoulder pain, left chest pain, and left hip pain. He denies neck pain. There is minimal shortness of breath. He denies abdominal pain.    Workup showed left rib fractures with a small pneumothorax, left iliac wing and left superior/inferior rami fractures, left clavicle fracture, scalp hematoma.  Patient was admitted and was transferred to the ICU for close monitoring.  His pneumothorax was treated conservatively and after repeat CXR it showed improvement.  Orthopedics Dr. Tonita Cong saw the patient and recommended non-op treatment.  He consulted with Dr. Marcelino Scot who also recommended non-op management.  He is weight bearing as tolerated on the left.  Shoulder sling for the left clavicle.  He experienced post-injury urinary retention and he will be maintained on urecholine and flomax.  After he is urinating well he can be weaned off urecholine.  He should maintain his foley until Sunday 03/09/14 then re-attempt  voiding trial.  Diet was advanced as tolerated.  Therapies evaluated the patient and recommended CIR at discharge.  Dr. Naaman Plummer has seen and evaluated and agrees about admission.  Pending insurance authorization he will be admitted to [facility] inpatient rehab.  On HD #60, the patient was voiding well, tolerating diet, ambulating well, pain well controlled, vital signs stable, incisions c/d/i and felt stable for discharge home.  Patient will follow up in our office as needed and knows to call with questions or concerns.  He will need follow up with either Dr. Tonita Cong or Dr. Marcelino Scot after discharge.  He is unsure who he wants to follow up with.       Medication List    ASK your doctor about these medications        aspirin 81 MG tablet  Take 81 mg by mouth daily.     B-12 PO  Take by mouth daily.     CALCIUM + D PO  Take by mouth daily.     CLARITIN PO  Take 10 mg by mouth daily.     fish oil-omega-3 fatty acids 1000 MG capsule  Take by mouth daily.     multivitamin tablet  Take 1 tablet by mouth daily.     VITAMIN C PO  Take by mouth daily.         Follow-up Information    Follow up with HANDY,MICHAEL H, MD. Schedule an appointment as soon as possible for a visit in 2 weeks.   Specialty:  Orthopedic Surgery   Why:  For post-hospital follow up  Contact information:   [address] [place] [address] [phone]       Follow up with Johnn Hai, MD.   Specialty:  Orthopedic Surgery   Why:  As needed - if insurance would be better through Dr. Marijo File information:   [address] [place] [address] [phone]       Follow up with [place].   Why:  As needed if you have any problems with your breathing or ribs   Contact information:   [place] [address] [phone]       Signed: Saverio Danker, [facility] Surgery  Trauma Service [phone]  03/08/2014, 8:56  AM

## 2014-03-07 NOTE — Progress Notes (Signed)
Occupational Therapy Evaluation Patient Details Name: Roger Wilson MRN: [id number] DOB: 1955-06-08 Today's Date: 03/07/2014    History of Present Illness pt sustained a 2 story fall while cleaning gutters resulting in L distal Clavicle fx, L 1st Rib fx, L pelvic fxs, and L PTX.     Clinical Impression   PTA pt lived at home and was independent with ADLs and IADLs. 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 Pt is limited by L hip pain and decreased ROM due to pelvic fx as well as L shoulder pain. Pt requires assist for LB ADLs due to limited ROM and is progressing well with functional mobility. Pt is an excellent candidate for CIR to get him to Mod Independent level prior to return home with strong family support.     Follow Up Recommendations  CIR;Supervision/Assistance - 24 hour    Equipment Recommendations  Other (comment) (defer to CIR)    Recommendations for Other Services       Precautions / Restrictions Precautions Precautions: Fall Restrictions Weight Bearing Restrictions: No      Mobility Bed Mobility Overal bed mobility: Needs Assistance;+2 for physical assistance Bed Mobility: Rolling;Sidelying to Sit Rolling: Supervision Sidelying to sit: HOB elevated;Max assist;+2 for physical assistance       General bed mobility comments: Pt able to mobilize BLEs to EOB with increased time and cues. Pt given belt to assist moving LLE to EOB, required Min A to assist with LLE movement off bed. Max A with trunk to get to seated position.  Transfers Overall transfer level: Needs assistance Equipment used: Rolling walker (2 wheeled) Transfers: Sit to/from Stand Sit to Stand: Min guard         General transfer comment: Min guard for safety/balance. VC's for hand placement and technique. Cues to reach back upon descent into chair.    Balance Overall balance assessment: Needs assistance Sitting-balance support: No upper extremity supported;Feet  supported Sitting balance-Leahy Scale: Good Sitting balance - Comments: Able to sit EOB without UE support. Able to scoot along side bed x2 for better positioning without LOB.   Standing balance support: Single extremity supported;During functional activity Standing balance-Leahy Scale: Poor Standing balance comment: Pt able to remove single UE from RW (RUE) to perform LB bathing with no sway or LOB. Pt requires BUE support for dynamic balance.                             ADL Overall ADL's : Needs assistance/impaired Eating/Feeding: Sitting;Set up Eating/Feeding Details (indicate cue type and reason): needs (A) with opening tight containers due to LUE shoulder pain  Grooming: Oral care;Wash/dry face;Min guard;Standing Grooming Details (indicate cue type and reason): pt is able to remove 1 hand from RW during static standing to perform oral care and wash face with cloth Upper Body Bathing: Sitting;Min guard Upper Body Bathing Details (indicate cue type and reason): pt able to complete without assistance including underneath both arms, though could need assistance if increased LUE pain to reach across to RUE Lower Body Bathing: Sit to/from stand;Moderate assistance Lower Body Bathing Details (indicate cue type and reason): (A) for Bil LEs from knees down due to pt inability to lean forward to reach (limited by hip pain). Pt able to wash groin and buttocks in standing.  Upper Body Dressing : Min guard;Sitting   Lower Body Dressing: Maximal assistance;Sit to/from stand Lower Body Dressing Details (indicate cue type and reason): (A) to  thread Bil LEs and for socks and shoes.  Toilet Transfer: Min guard;Stand-pivot;RW (bed>recliner )   Toileting- Water quality scientist and Hygiene: Min guard;Sit to/from stand Toileting - Clothing Manipulation Details (indicate cue type and reason): pt has foley cath, however demonstrated ability to perform pericare in standing during LB bathing. Pt can  use UEs to manage clothing.      Functional mobility during ADLs: Min guard;Rolling walker General ADL Comments: Pt with improved pain control due to premedication and able to participate fully in PT/OT session. Pt ambulated in room with +2 for safety, however only required Min guard assist. Pt performed sponge bath with (A) for Bil LEs and performed pericare in standing. Pt would be an excellent CIR candidate, however, due to limitations of mobility and home setup.      Vision  Pt reports no change from baseline.                    Perception Perception Perception Tested?: No   Praxis Praxis Praxis tested?: Within functional limits    Pertinent Vitals/Pain Pain Assessment: 0-10 Pain Score: 3  Pain Location: L hip > L shoulder Pain Descriptors / Indicators: Sore;Sharp Pain Intervention(s): Monitored during session;Repositioned;PCA encouraged;Premedicated before session     Hand Dominance Right   Extremity/Trunk Assessment Upper Extremity Assessment Upper Extremity Assessment: LUE deficits/detail LUE Deficits / Details: clavicle fracture resulting in pain of LUE (shoulder). WBAT per MD notes and pt able to use UE to assist with bed mobility and sit<>stand.  Decreased forward flexion due to pain (~90*). LUE Coordination: decreased gross motor   Lower Extremity Assessment Lower Extremity Assessment: Defer to PT evaluation   Cervical / Trunk Assessment Cervical / Trunk Assessment: Normal   Communication Communication Communication: No difficulties   Cognition Arousal/Alertness: Awake/alert Behavior During Therapy: WFL for tasks assessed/performed Overall Cognitive Status: Within Functional Limits for tasks assessed                                Home Living Family/patient expects to be discharged to:: Inpatient rehab Living Arrangements: Spouse/significant other                                      Prior Functioning/Environment Level  of Independence: Independent        Comments: pt works as Programme researcher, broadcasting/film/video.    OT Diagnosis: Generalized weakness;Acute pain   OT Problem List: Decreased range of motion;Decreased strength;Decreased activity tolerance;Impaired balance (sitting and/or standing);Decreased safety awareness;Decreased knowledge of use of DME or AE;Decreased knowledge of precautions;Impaired UE functional use;Pain   OT Treatment/Interventions: Self-care/ADL training;Therapeutic exercise;Energy conservation;DME and/or AE instruction;Therapeutic activities;Patient/family education;Balance training    OT Goals(Current goals can be found in the care plan section) Acute Rehab OT Goals Patient Stated Goal: to get back to biking and being active OT Goal Formulation: With patient Time For Goal Achievement: 03/21/14 Potential to Achieve Goals: Good ADL Goals Pt Will Perform Grooming: with supervision;standing (standing for 5 minutes.) Pt Will Perform Lower Body Bathing: with set-up;with supervision;with adaptive equipment;sit to/from stand Pt Will Perform Lower Body Dressing: with set-up;with supervision;with adaptive equipment;sit to/from stand Pt Will Transfer to Toilet: with supervision;ambulating;bedside commode Pt Will Perform Toileting - Clothing Manipulation and hygiene: with supervision;sit to/from stand  OT Frequency: Min 3X/week           Co-evaluation PT/OT/SLP Co-Evaluation/Treatment:  Yes Reason for Co-Treatment: For patient/therapist safety PT goals addressed during session: Mobility/safety with mobility;Balance OT goals addressed during session: ADL's and self-care      End of Session Equipment Utilized During Treatment: Gait belt;Rolling walker Nurse Communication: Mobility status  Activity Tolerance: Patient tolerated treatment well Patient left: in chair;with call Territo/phone within reach;with family/visitor present   Time: 6568-1275 OT Time Calculation (min): 58 min Charges:  OT General  Charges $OT Visit: 1 Procedure OT Evaluation $Initial OT Evaluation Tier I: 1 Procedure OT Treatments $Self Care/Home Management : 23-37 mins  Juluis Rainier 03/07/2014, 12:08 PM   Cyndie Chime, OTR/L Occupational Therapist [phone] (pager)

## 2014-03-07 NOTE — Progress Notes (Signed)
Rehab admissions - I have approval from [facility] [place] for acute inpatient rehab admission for tomorrow, Saturday.  I will plan to admit to rehab in am.  Call me for questions.  #[phone]

## 2014-03-07 NOTE — Progress Notes (Signed)
[facility]  Progress Note   LOS: 3 days   Subjective: Pt's pain much better controlled.  No N/V tolerating diet.  Starting to mobilize with PT, OT pending.  Some flatus, no BM since 11/17.  Sat in chair yesterday.    Objective: Vital signs in last 24 hours: Temp:  [97.9 F (36.6 C)-99.3 F (37.4 C)] 98.1 F (36.7 C) (11/20 0530) Pulse Rate:  [60-70] 60 (11/20 0530) Resp:  [13-20] 14 (11/20 0530) BP: (110-143)/(67-82) 126/73 mmHg (11/20 0530) SpO2:  [87 %-98 %] 95 % (11/20 0530) Weight:  [178 lb 3.2 oz (80.831 kg)] 178 lb 3.2 oz (80.831 kg) (11/19 1221) Last BM Date: 03/04/14  Lab Results:  CBC  Recent Labs  03/04/14 1357 03/05/14 0240  WBC 9.3 8.4  HGB 14.6 14.3  HCT 41.0 39.1  PLT 129* 123*   BMET  Recent Labs  03/04/14 1316 03/04/14 1330 03/05/14 0240  NA 138 141 134*  K 4.2 4.1 4.1  CL 103 102 100  CO2 27  --  24  GLUCOSE 123* 129* 113*  BUN 12 12 10   CREATININE 0.86 0.90 0.80  CALCIUM 8.3*  --  8.0*    Imaging: Dg Pelvis Comp Min 3v  03/05/2014   CLINICAL DATA:  Fall off roof yesterday, pelvic fracture, left side pain  EXAM: JUDET PELVIS - 3+ VIEW  COMPARISON:  03/04/2014  FINDINGS: Three views of the pelvis submitted including Judet view. Again noted Mild displaced comminuted fracture of the left iliac wing. There is mild displaced fracture of left inferior pubic ramus. Nondisplaced fracture of the left superior pubic ramus. Again noted a transverse fracture line in left iliac bone progressing to left SI joint.  IMPRESSION: Again noted Mild displaced post traumatic comminuted fracture of the left iliac wing. There is mild displaced fracture of left inferior pubic ramus. Nondisplaced fracture of the left superior pubic ramus. Again noted a transverse fracture line in left iliac bone progressing to left SI joint.   Electronically Signed   By: Lahoma Crocker M.D.   On: 03/05/2014 09:46     PE: General: pleasant, WD/WN white male  who is laying in bed in NAD HEENT: head is normocephalic, atraumatic.  Sclera are noninjected.  PERRL.  Ears and nose without any masses or lesions.  Mouth is pink and moist Heart: regular, rate, and rhythm.  Normal s1,s2. No obvious murmurs, gallops, or rubs noted.  Palpable radial and pedal pulses bilaterally Lungs: CTAB, no wheezes, rhonchi, or rales noted.  Respiratory effort nonlabored.  IS to 2000.  Left chest wall tenderness. Abd: soft, NT/ND, +BS, no masses, hernias, or organomegaly MS: all 4 extremities are symmetrical with no cyanosis, clubbing, or edema - left hip/back tenderness Skin: warm and dry with no masses, lesions, or rashes Psych: A&Ox3 with an appropriate affect.   Assessment/Plan: Fall from roof  L rib FXs and PTX - stable on CXR 11/18 L iliac wing FX, L sub and inf rami FXs - WBAT per Dr. Marcelino Scot. Pain mgmt improving.  Dhort course toradol plus PO meds. FEN - tolerating diet, d/c PCA tomorrow once pain better controlled, add robaxin PT/OT - OT eval pending VTE - Lovenox, SCD's Dispo - On floor, CIR candidate, pending insurance approval, medically stable today, needs rolling walker   Coralie Keens, [place] Pager: [phone] General Trauma [place] Pager: [phone]   03/07/2014

## 2014-03-07 NOTE — Discharge Instructions (Signed)
Rib Fracture A rib fracture is a break or crack in one of the bones of the ribs. The ribs are a group of long, curved bones that wrap around your chest and attach to your spine. They protect your lungs and other organs in the chest cavity. A broken or cracked rib is often painful, but most do not cause other problems. Most rib fractures heal on their own over time. However, rib fractures can be more serious if multiple ribs are broken or if broken ribs move out of place and push against other structures. CAUSES   A direct blow to the chest. For example, this could happen during contact sports, a car accident, or a fall against a hard object.  Repetitive movements with high force, such as pitching a baseball or having severe coughing spells. SYMPTOMS   Pain when you breathe in or cough.  Pain when someone presses on the injured area. DIAGNOSIS  Your caregiver will perform a physical exam. Various imaging tests may be ordered to confirm the diagnosis and to look for related injuries. These tests may include a chest X-ray, computed tomography (CT), magnetic resonance imaging (MRI), or a bone scan. TREATMENT  Rib fractures usually heal on their own in 1-3 months. The longer healing period is often associated with a continued cough or other aggravating activities. During the healing period, pain control is very important. Medication is usually given to control pain. Hospitalization or surgery may be needed for more severe injuries, such as those in which multiple ribs are broken or the ribs have moved out of place.  HOME CARE INSTRUCTIONS   Avoid strenuous activity and any activities or movements that cause pain. Be careful during activities and avoid bumping the injured rib.  Gradually increase activity as directed by your caregiver.  Only take over-the-counter or prescription medications as directed by your caregiver. Do not take other medications without asking your caregiver first.  Apply ice  to the injured area for the first 1-2 days after you have been treated or as directed by your caregiver. Applying ice helps to reduce inflammation and pain.  Put ice in a plastic bag.  Place a towel between your skin and the bag.   Leave the ice on for 15-20 minutes at a time, every 2 hours while you are awake.  Perform deep breathing as directed by your caregiver. This will help prevent pneumonia, which is a common complication of a broken rib. Your caregiver may instruct you to:  Take deep breaths several times a day.  Try to cough several times a day, holding a pillow against the injured area.  Use a device called an incentive spirometer to practice deep breathing several times a day.  Drink enough fluids to keep your urine clear or pale yellow. This will help you avoid constipation.   Do not wear a rib belt or binder. These restrict breathing, which can lead to pneumonia.  SEEK IMMEDIATE MEDICAL CARE IF:   You have a fever.   You have difficulty breathing or shortness of breath.   You develop a continual cough, or you cough up thick or bloody sputum.  You feel sick to your stomach (nausea), throw up (vomit), or have abdominal pain.   You have worsening pain not controlled with medications.  MAKE SURE YOU:  Understand these instructions.  Will watch your condition.  Will get help right away if you are not doing well or get worse. Document Released: 04/04/2005 Document Revised: 12/05/2012 Document Reviewed:  06/06/2012 [employer] Patient Information 2015 [place], [place]. This information is not intended to replace advice given to you by your health care provider. Make sure you discuss any questions you have with your health care provider.   Stable Pelvic Fracture You have one or more fractures (this means there is a break in the bones) of the pelvis. The pelvis is the ring of bones that make up your hipbones. These are the bones you sit on and the lower part of the  spine. It is like a boney ring where your legs attach and which supports your upper body. You have an undisplaced fracture. This means the bones are in good position. The pelvic fracture you have is a simple (uncomplicated) fracture. DIAGNOSIS  X-rays usually diagnose these fractures. TREATMENT  The goal of treating pelvic fractures is to get the bones to heal in a good position. The patient should return to normal activities as soon as possible. Such fractures are often treated with normal bed rest and conservative measures.  HOME CARE INSTRUCTIONS   You should be on bed rest for as long as directed by your caregiver. Change positions of your legs every 1-2 hours to maintain good blood flow. You may sit as long as is tolerable. Following this, you may do usual activities, but avoid strenuous activities for as long as directed by your caregiver.  Only take over-the-counter or prescription medicines for pain, discomfort, or fever as directed by your caregiver.  Bed rest may also be used for discomfort.  Resume your activities when you are able. Use a cane or crutch on the injured side to reduce pain while walking, as needed.  If you develop increased pain or discomfort not relieved with medications, contact your caregiver.  Warning: Do not drive a car or operate a motor vehicle until your caregiver specifically tells you it is safe to do so. SEEK IMMEDIATE MEDICAL CARE IF:   You feel light-headed or faint, develop chest pain or shortness of breath.  An unexplained oral temperature above 102 F (38.9 C) develops.  You develop blood in the urine or in the stools.  There is difficulty urinating, and/or having a bowel movement, or pain with these efforts.  There is a difficulty or increased pain with walking.  There is swelling in one or both legs that is not normal. Document Released: 06/13/2001 Document Revised: 08/19/2013 Document Reviewed: 11/16/2007 [facility] Patient Information  2015 [place], [place]. This information is not intended to replace advice given to you by your health care provider. Make sure you discuss any questions you have with your health care provider.  Pneumothorax A pneumothorax, commonly called a collapsed lung, is a condition in which air leaks from a lung and builds up in the space between the lung and the chest wall (pleural space). The air in a pneumothorax is trapped outside the lung and takes up space, preventing the lung from fully expanding. This is a condition that usually occurs suddenly. The buildup of air may be small or large. A small pneumothorax may go away on its own. When a pneumothorax is larger, it will often require medical treatment and hospitalization.  CAUSES  A pneumothorax can sometimes happen quickly with no apparent cause. People with underlying lung problems, particularly COPD or emphysema, are at higher risk of pneumothorax. However, pneumothorax can happen quickly even in people with no prior known lung problems. Trauma, surgery, medical procedures, or injury to the chest wall can also cause a pneumothorax. SIGNS AND SYMPTOMS  Sometimes a pneumothorax will have no symptoms. When symptoms are present, they can include:  Chest pain.  Shortness of breath.  Increased rate of breathing.  Bluish color to your lips or skin (cyanosis). DIAGNOSIS  Pneumothorax is usually diagnosed by a chest X-ray or chest CT scan. Your health care provider will also take a medical history and perform a physical exam to determine why you may have a pneumothorax. TREATMENT  A small pneumothorax may go away on its own without treatment. Extra oxygen can sometimes help a small pneumothorax go away more quickly. For a larger pneumothorax or a pneumothorax that is causing symptoms, a procedure is usually needed to drain the air.In some cases, the health care provider may drain the air using a needle. In other cases, a chest tube may be inserted into the  pleural space. A chest tube is a small tube placed between the ribs and into the pleural space. This removes the extra air and allows the lung to expand back to its normal size. A large pneumothorax will usually require a hospital stay. If there is ongoing air leakage into the pleural space, then the chest tube may need to remain in place for several days until the air leak has healed. In some cases, surgery may be needed.  HOME CARE INSTRUCTIONS   Only take over-the-counter or prescription medicines as directed by your health care provider.  If a cough or pain makes it difficult for you to sleep at night, try sleeping in a semi-upright position in a recliner or by using 2 or 3 pillows.  Rest and limit activity as directed by your health care provider.  If you had a chest tube and it was removed, ask your health care provider when it is okay to remove the dressing. Until your health care provider says you can remove the dressing, do not allow it to get wet.  Do not smoke. Smoking is a risk factor for pneumothorax.  Do not fly in an airplane or scuba dive until your health care provider says it is okay.  Follow up with your health care provider as directed. SEEK IMMEDIATE MEDICAL CARE IF:   You have increasing chest pain or shortness of breath.  You have a cough that is not controlled with suppressants.  You begin coughing up blood.  You have pain that is getting worse or is not controlled with medicines.  You cough up thick, discolored mucus (sputum) that is yellow to green in color.  You have redness, increasing pain, or discharge at the site where a chest tube had been in place (if your pneumothorax was treated with a chest tube).  The site where your chest tube was located opens up.  You feel air coming out of the site where the chest tube was placed.  You have a fever or persistent symptoms for more than 2-3 days.  You have a fever and your symptoms suddenly get worse. MAKE  SURE YOU:   Understand these instructions.  Will watch your condition.  Will get help right away if you are not doing well or get worse. Document Released: 04/04/2005 Document Revised: 01/23/2013 Document Reviewed: 11/01/2012 [facility] Patient Information 2015 [place], [place]. This information is not intended to replace advice given to you by your health care provider. Make sure you discuss any questions you have with your health care provider.

## 2014-03-08 ENCOUNTER — Inpatient Hospital Stay (HOSPITAL_COMMUNITY)
Admission: EM | Admit: 2014-03-08 | Discharge: 2014-03-14 | DRG: 561 | Disposition: A | Discharge status: Home / Self Care | Payer: BC Managed Care – PPO | Source: Intra-hospital | Attending: Physical Medicine & Rehabilitation | Admitting: Physical Medicine & Rehabilitation

## 2014-03-08 DIAGNOSIS — S42002D Fracture of unspecified part of left clavicle, subsequent encounter for fracture with routine healing: Secondary | ICD-10-CM

## 2014-03-08 DIAGNOSIS — S32302D Unspecified fracture of left ilium, subsequent encounter for fracture with routine healing: Secondary | ICD-10-CM | POA: Diagnosis present

## 2014-03-08 DIAGNOSIS — T40605A Adverse effect of unspecified narcotics, initial encounter: Secondary | ICD-10-CM | POA: Diagnosis present

## 2014-03-08 DIAGNOSIS — L299 Pruritus, unspecified: Secondary | ICD-10-CM | POA: Diagnosis present

## 2014-03-08 DIAGNOSIS — S2232XD Fracture of one rib, left side, subsequent encounter for fracture with routine healing: Secondary | ICD-10-CM

## 2014-03-08 DIAGNOSIS — W11XXXD Fall on and from ladder, subsequent encounter: Secondary | ICD-10-CM | POA: Diagnosis present

## 2014-03-08 DIAGNOSIS — K5909 Other constipation: Secondary | ICD-10-CM | POA: Diagnosis present

## 2014-03-08 DIAGNOSIS — S32502S Unspecified fracture of left pubis, sequela: Secondary | ICD-10-CM | POA: Diagnosis not present

## 2014-03-08 DIAGNOSIS — S32302A Unspecified fracture of left ilium, initial encounter for closed fracture: Secondary | ICD-10-CM | POA: Diagnosis present

## 2014-03-08 DIAGNOSIS — M25552 Pain in left hip: Secondary | ICD-10-CM

## 2014-03-08 DIAGNOSIS — S0003XA Contusion of scalp, initial encounter: Secondary | ICD-10-CM | POA: Diagnosis present

## 2014-03-08 DIAGNOSIS — T1490XA Injury, unspecified, initial encounter: Secondary | ICD-10-CM

## 2014-03-08 DIAGNOSIS — S2232XA Fracture of one rib, left side, initial encounter for closed fracture: Secondary | ICD-10-CM | POA: Diagnosis present

## 2014-03-08 DIAGNOSIS — S32592D Other specified fracture of left pubis, subsequent encounter for fracture with routine healing: Secondary | ICD-10-CM | POA: Diagnosis not present

## 2014-03-08 DIAGNOSIS — R339 Retention of urine, unspecified: Secondary | ICD-10-CM | POA: Diagnosis present

## 2014-03-08 DIAGNOSIS — T1490 Injury, unspecified: Secondary | ICD-10-CM | POA: Diagnosis not present

## 2014-03-08 DIAGNOSIS — S32302S Unspecified fracture of left ilium, sequela: Secondary | ICD-10-CM | POA: Diagnosis not present

## 2014-03-08 DIAGNOSIS — S2232XS Fracture of one rib, left side, sequela: Secondary | ICD-10-CM | POA: Diagnosis not present

## 2014-03-08 DIAGNOSIS — S32592A Other specified fracture of left pubis, initial encounter for closed fracture: Secondary | ICD-10-CM | POA: Diagnosis present

## 2014-03-08 LAB — CREATININE, SERUM
Creatinine, Ser: 0.86 mg/dL (ref 0.50–1.35)
GFR calc non Af Amer: >90 mL/min (ref >90)

## 2014-03-08 LAB — CBC
HEMATOCRIT: 35.3 % — AB (ref 39.0–52.0)
Hemoglobin: 12.6 g/dL — ABNORMAL LOW (ref 13.0–17.0)
MCH: 30.4 pg (ref 26.0–34.0)
MCHC: 35.7 g/dL (ref 30.0–36.0)
MCV: 85.1 fL (ref 78.0–100.0)
PLATELETS: 123 10*3/uL — AB (ref 150–400)
RBC: 4.15 MIL/uL — AB (ref 4.22–5.81)
RDW: 12.2 % (ref 11.5–15.5)
WBC: 5.2 10*3/uL (ref 4.0–10.5)

## 2014-03-08 MED ORDER — LORATADINE 10 MG PO TABS
10.0000 mg | ORAL_TABLET | Freq: Every day | ORAL | Status: DC
  Administered 2014-03-09 – 2014-03-14 (×6): 10 mg via ORAL
  Filled 2014-03-08 (×7): qty 1

## 2014-03-08 MED ORDER — PANTOPRAZOLE SODIUM 40 MG PO TBEC
40.0000 mg | DELAYED_RELEASE_TABLET | Freq: Every day | ORAL | Status: DC
  Administered 2014-03-08 – 2014-03-14 (×7): 40 mg via ORAL
  Filled 2014-03-08 (×7): qty 1

## 2014-03-08 MED ORDER — TAMSULOSIN HCL 0.4 MG PO CAPS
0.4000 mg | ORAL_CAPSULE | Freq: Every day | ORAL | Status: DC
  Administered 2014-03-09 – 2014-03-14 (×6): 0.4 mg via ORAL
  Filled 2014-03-08 (×7): qty 1

## 2014-03-08 MED ORDER — OXYCODONE HCL 5 MG PO TABS
10.0000 mg | ORAL_TABLET | ORAL | Status: DC | PRN
  Administered 2014-03-08 – 2014-03-14 (×29): 10 mg via ORAL
  Filled 2014-03-08 (×31): qty 2

## 2014-03-08 MED ORDER — METHOCARBAMOL 500 MG PO TABS
500.0000 mg | ORAL_TABLET | Freq: Four times a day (QID) | ORAL | Status: DC | PRN
  Administered 2014-03-10 – 2014-03-13 (×5): 500 mg via ORAL
  Filled 2014-03-08 (×5): qty 1

## 2014-03-08 MED ORDER — SORBITOL 70 % SOLN
30.0000 mL | Freq: Every day | Status: DC | PRN
  Administered 2014-03-08 – 2014-03-11 (×2): 30 mL via ORAL
  Filled 2014-03-08 (×2): qty 30

## 2014-03-08 MED ORDER — ENOXAPARIN SODIUM 40 MG/0.4ML ~~LOC~~ SOLN
40.0000 mg | SUBCUTANEOUS | Status: DC
  Administered 2014-03-09 – 2014-03-14 (×6): 40 mg via SUBCUTANEOUS
  Filled 2014-03-08 (×7): qty 0.4

## 2014-03-08 MED ORDER — HYDROCERIN EX CREA
TOPICAL_CREAM | Freq: Three times a day (TID) | CUTANEOUS | Status: DC | PRN
  Filled 2014-03-08: qty 113

## 2014-03-08 MED ORDER — ENOXAPARIN SODIUM 40 MG/0.4ML ~~LOC~~ SOLN: 40.0000 mg | Freq: Every day | SUBCUTANEOUS | Status: DC

## 2014-03-08 MED ORDER — DOCUSATE SODIUM 100 MG PO CAPS
100.0000 mg | ORAL_CAPSULE | Freq: Two times a day (BID) | ORAL | Status: DC
  Administered 2014-03-08 – 2014-03-09 (×2): 100 mg via ORAL
  Filled 2014-03-08 (×4): qty 1

## 2014-03-08 MED ORDER — ACETAMINOPHEN 325 MG PO TABS
325.0000 mg | ORAL_TABLET | ORAL | Status: DC | PRN
Start: 2014-03-08 — End: 2014-03-14
  Administered 2014-03-08: 650 mg via ORAL
  Filled 2014-03-08: qty 2

## 2014-03-08 MED ORDER — ONDANSETRON HCL 4 MG/2ML IJ SOLN: 4.0000 mg | Freq: Four times a day (QID) | INTRAMUSCULAR | Status: DC | PRN

## 2014-03-08 MED ORDER — POLYETHYLENE GLYCOL 3350 17 G PO PACK
17.0000 g | PACK | Freq: Every day | ORAL | Status: DC
  Administered 2014-03-08 – 2014-03-14 (×7): 17 g via ORAL
  Filled 2014-03-08 (×8): qty 1

## 2014-03-08 MED ORDER — ONDANSETRON HCL 4 MG PO TABS: 4.0000 mg | ORAL_TABLET | Freq: Four times a day (QID) | ORAL | Status: DC | PRN

## 2014-03-08 MED ORDER — DIPHENHYDRAMINE HCL 50 MG/ML IJ SOLN
INTRAMUSCULAR | Status: AC
  Filled 2014-03-08: qty 1

## 2014-03-08 MED ORDER — DIPHENHYDRAMINE HCL 25 MG PO CAPS
25.0000 mg | ORAL_CAPSULE | Freq: Four times a day (QID) | ORAL | Status: DC | PRN
Start: 2014-03-08 — End: 2014-03-08
  Administered 2014-03-08: 25 mg via ORAL
  Filled 2014-03-08: qty 1

## 2014-03-08 MED ORDER — BISACODYL 10 MG RE SUPP: 10.0000 mg | Freq: Every day | RECTAL | Status: DC | PRN

## 2014-03-08 MED ORDER — BETHANECHOL CHLORIDE 25 MG PO TABS
25.0000 mg | ORAL_TABLET | Freq: Three times a day (TID) | ORAL | Status: DC
  Administered 2014-03-08 – 2014-03-11 (×11): 25 mg via ORAL
  Filled 2014-03-08 (×15): qty 1

## 2014-03-08 NOTE — Progress Notes (Addendum)
Received pt. As a transfer from [place].Pt. And wife were oriented to unit and routine.Safety plan was explained.The fall risk prevention plan was explained and sign,the safety video was showed.Keep monitoring pt. Closely and assessing his needs.Pt. Has a generalized rash on both of his thighs down to his legs,MD. Was notified,order for Eucerin cream was received,also Hyper allergenic linnean was order for this room.Keep monitoring pt. Closely.

## 2014-03-08 NOTE — Progress Notes (Signed)
Subjective Has some flank and hip pain if not medicated  No BM in 5 days (since hospitalization).   OBJ: BP 167/81 mmHg  Pulse 67  Temp(Src) 98.3 F (36.8 C) (Oral)  Resp 18  Ht 5\' 11"  (1.803 m)  Wt 185 lb (83.915 kg)  BMI 25.81 kg/m2  SpO2 98%  nad Chest clear cv reg rate abd Soft, nd  Ap: Medical Problem List and Plan: 1. Functional deficits secondary to multitrauma after a fall from a ladder including left iliac wing fracture, left superior and inferior pubic rami fractures, left rib and clavicle fracture . 2. DVT Prophylaxis/Anticoagulation: Subcutaneous Lovenox. Monitor platelet counts any signs of bleeding 3. Pain Management: Oxycodone and Robaxin as needed. May consider scheduled narcotics 4. Urinary retent urocholine and Flomax as directed. Check PVRs 3. Monitor with increased mobility. Follow-up urine study 5. Neuropsych: This patient is capable of making decisions on hisown behalf. 6. Skin/Wound Care: Routine skin checks 7.

## 2014-03-08 NOTE — Progress Notes (Signed)
Patient ID: Roger Wilson, male   DOB: [birth year], 58 y.o.   MRN: [id number]    Subjective: Pt feels ok.  No BM, but otherwise good pain control  Objective: Vital signs in last 24 hours: Temp:  [97.9 F (36.6 C)-98.4 F (36.9 C)] 98.3 F (36.8 C) (11/21 0502) Pulse Rate:  [60-70] 60 (11/21 0502) Resp:  [14-16] 16 (11/21 0502) BP: (131-162)/(68-88) 152/88 mmHg (11/21 0502) SpO2:  [93 %-97 %] 95 % (11/21 0502) Last BM Date: 03/04/14  Intake/Output from previous day: 11/20 0701 - 11/21 0700 In: 1885 [P.O.:1182; I.V.:703] Out: 1750 [Urine:1750] Intake/Output this shift:    PE: Gen: NAD Heart: regular Lungs: CTAB Abd: soft, NT, ND EXT: NVI  Lab Results:   Recent Labs  03/07/14 0545  WBC 5.5  HGB 12.3*  HCT 34.4*  PLT 105*   BMET  Recent Labs  03/07/14 0545  NA 139  K 4.3  CL 103  CO2 25  GLUCOSE 93  BUN 10  CREATININE 0.78  CALCIUM 8.1*   PT/INR No results for input(s): LABPROT, INR in the last 72 hours. CMP     Component Value Date/Time   NA 139 03/07/2014 0545   K 4.3 03/07/2014 0545   CL 103 03/07/2014 0545   CO2 25 03/07/2014 0545   GLUCOSE 93 03/07/2014 0545   BUN 10 03/07/2014 0545   CREATININE 0.78 03/07/2014 0545   CALCIUM 8.1* 03/07/2014 0545   PROT 6.3 03/04/2014 1316   ALBUMIN 3.4* 03/04/2014 1316   AST 41* 03/04/2014 1316   ALT 32 03/04/2014 1316   ALKPHOS 44 03/04/2014 1316   BILITOT 0.6 03/04/2014 1316   GFRNONAA >90 03/07/2014 0545   GFRAA >90 03/07/2014 0545   Lipase  No results found for: LIPASE     Studies/Results: Dg Pelvis Comp Min 3v  03/07/2014   CLINICAL DATA:  Followup left hemipelvis fractures.  EXAM: JUDET PELVIS - 3+ VIEW  COMPARISON:  03/05/2014  FINDINGS: Comminuted fractures of the left ilium are without change from the prior exam. Fractures also across the medial acetabular wall breaching the ileal pubic line across the pubic bone adjacent to the pubic symphysis. There is another fracture involving the  inferior pubic ramus near the junction of the ischium and pubis. Mild fracture displacement with a component of the iliac bone fracture displaced posteriorly by 8 mm. Other fractures are nondisplaced.  Hip joints, SI joints and symphysis pubis are normally spaced and aligned.  There is left sided soft tissue edema. Bulging of the left gluteal muscles is likely from an underlying hematoma. These findings are stable.  IMPRESSION: 1. Comminuted left hemipelvis fractures as detailed. Fractures involve the iliac bone extending from the iliac crest to the lateral superior acetabulum. There is a fracture across the medial acetabular wall and other fractures of the superior inferior left pubic rami. No change from the prior study.   Electronically Signed   By: Lajean Manes M.D.   On: 03/07/2014 10:32    Anti-infectives: Anti-infectives    None       Assessment/Plan   Fall from roof  L rib FXs and PTX - stable on CXR 11/18 L iliac wing FX, L sub and inf rami FXs - WBAT per Dr. Marcelino Scot.  FEN - tolerating diet, add Miralax PT/OT  VTE - Lovenox, SCD's Dispo - CIR today  LOS: 4 days    Roger Wilson E 03/08/2014, 8:52 AM Pager: [phone]

## 2014-03-08 NOTE — Discharge Planning (Signed)
At 1415 transferred to [id number] with per w/c with all personal belongings, acomp. By NT and wife.  Given dose of pain med prior to dc at 1345.

## 2014-03-09 ENCOUNTER — Inpatient Hospital Stay (HOSPITAL_COMMUNITY): Payer: BC Managed Care – PPO | Admitting: Occupational Therapy

## 2014-03-09 ENCOUNTER — Inpatient Hospital Stay (HOSPITAL_COMMUNITY): Payer: BC Managed Care – PPO | Admitting: Physical Therapy

## 2014-03-09 DIAGNOSIS — S42001A Fracture of unspecified part of right clavicle, initial encounter for closed fracture: Secondary | ICD-10-CM

## 2014-03-09 DIAGNOSIS — S329XXA Fracture of unspecified parts of lumbosacral spine and pelvis, initial encounter for closed fracture: Secondary | ICD-10-CM

## 2014-03-09 DIAGNOSIS — K5900 Constipation, unspecified: Secondary | ICD-10-CM

## 2014-03-09 DIAGNOSIS — R609 Edema, unspecified: Secondary | ICD-10-CM

## 2014-03-09 MED ORDER — FLEET ENEMA 7-19 GM/118ML RE ENEM
1.0000 | ENEMA | Freq: Once | RECTAL | Status: AC
  Administered 2014-03-09: 1 via RECTAL
  Filled 2014-03-09: qty 1

## 2014-03-09 MED ORDER — HYDROCODONE-ACETAMINOPHEN 5-325 MG PO TABS
1.0000 | ORAL_TABLET | Freq: Four times a day (QID) | ORAL | Status: DC
  Administered 2014-03-09 – 2014-03-10 (×5): 1 via ORAL
  Filled 2014-03-09 (×5): qty 1

## 2014-03-09 MED ORDER — SENNOSIDES-DOCUSATE SODIUM 8.6-50 MG PO TABS
1.0000 | ORAL_TABLET | Freq: Two times a day (BID) | ORAL | Status: DC
  Administered 2014-03-09 (×2): 1 via ORAL
  Filled 2014-03-09 (×4): qty 1

## 2014-03-09 MED ORDER — CAMPHOR-MENTHOL 0.5-0.5 % EX LOTN
1.0000 "application " | TOPICAL_LOTION | CUTANEOUS | Status: DC | PRN
  Administered 2014-03-09: 1 via TOPICAL
  Filled 2014-03-09: qty 222

## 2014-03-09 NOTE — Evaluation (Signed)
Physical Therapy Assessment and Plan  Patient Details  Name: Roger Wilson MRN: [id number] Date of Birth: 1955-12-23  PT Diagnosis: Abnormality of gait, Difficulty walking, Muscle weakness and Pain in L hip and trunk Rehab Potential: Good ELOS: 7-9 days   Today's Date: 03/09/2014 PT Individual Time:1109-1209 and 1300-1330 PT Individual Time Calculation (min): 60 min and 30 min    Problem List:  Patient Active Problem List   Diagnosis Date Noted  . Trauma 03/08/2014  . Acute urinary retention 03/07/2014  . Fall from roof 03/07/2014  . Left rib fracture 03/07/2014  . Pneumothorax, traumatic 03/07/2014  . Fracture of left iliac wing 03/07/2014  . Closed fracture of ramus of left pubis 03/07/2014  . Fall 03/04/2014  . CELLULITIS, HAND, LEFT 12/17/2009  . RESIDUAL FOREIGN BODY IN SOFT TISSUE 10/21/2009    Past Medical History:  Past Medical History  Diagnosis Date  . Environmental allergies   . Hypertension    Past Surgical History:  Past Surgical History  Procedure Laterality Date  . Knee arthroscopy  2002    left    Assessment & Plan Clinical Impression: Patient is a 58 y.o. right handed male admitted 03/04/2014 after a fall from a roof approximately 10 feet with questionable loss of consciousness. Patient was independent prior to admission living with his wife. Complaints of left shoulder pain, chest pain and left hip pain. Cranial CT scan showed a high left parietal scalp hematoma without skull fracture or intracranial abnormality. CT abdomen and pelvis showed a left iliac wing fracture with left superior and inferior pubic rami fracture as well as retroperitoneal hematoma, left small pneumothorax, left first rib fracture and left clavicle fracture. Follow-up orthopedic services Dr. Marcelino Scot and currently weightbearing as tolerated left lower extremity and shoulder sling for left distal clavicle fracture question weightbearing left upper extremity. CT 3-D of pelvis shows  multiple fractures involving left iliac wing remaining external to the left acetabulum extending in a nondisplaced fracture plane into the left SI joint as well as fractures left superior and inferior pubic rami. Hospital course pain management.  Patient transferred to CIR on 03/08/2014 .   Patient currently requires min with gait and transfers but can require as much as total A for bed mobility secondary to pain, muscle weakness and muscle joint tightness, decreased cardiorespiratoy endurance and decreased sitting balance, decreased standing balance and decreased postural control.  Prior to hospitalization, patient was independent  with mobility and lived with Spouse in a House home.  Home access is 4Stairs to enter.  Patient will benefit from skilled PT intervention to maximize safe functional mobility, minimize fall risk and decrease caregiver burden for planned discharge home with intermittent assist.  Anticipate patient will benefit from follow up [facility] at discharge.  PT - End of Session Activity Tolerance: Decreased this session Endurance Deficit: Yes Endurance Deficit Description: Pain PT Assessment Rehab Potential (ACUTE/IP ONLY): Good Barriers to Discharge: Inaccessible home environment (stair to enter house with R rail) PT Patient demonstrates impairments in the following area(s): Balance;Endurance;Motor;Pain PT Transfers Functional Problem(s): Bed Mobility;Bed to Chair;Car;Furniture PT Locomotion Functional Problem(s): Ambulation;Wheelchair Mobility;Stairs PT Plan PT Intensity: Minimum of 1-2 x/day ,45 to 90 minutes PT Frequency: 5 out of 7 days PT Duration Estimated Length of Stay: 7-9 days PT Treatment/Interventions: Ambulation/gait training;Balance/vestibular training;Community reintegration;Discharge planning;DME/adaptive equipment instruction;Functional mobility training;Neuromuscular re-education;Pain management;Patient/family education;Stair training;Therapeutic  Activities;Therapeutic Exercise;UE/LE Strength taining/ROM;Wheelchair propulsion/positioning PT Transfers Anticipated Outcome(s): Mod I PT Locomotion Anticipated Outcome(s): Mod I with RW  PT Recommendation Follow  Up Recommendations: Home health PT Patient destination: Home Equipment Recommended: Wheelchair (measurements);Wheelchair cushion (measurements);Rolling walker with 5" wheels  Skilled Therapeutic Intervention Pt with multiple questions about pain management; pt reporting that he was able to participate more in therapy on acute due to scheduled IV pain medication.  Discussed with pt that the focus of rehab is to prepare the pt to be as independent as possible even with timing/requesting pain medication and that he would be going home on oral pain medication, not IV.   Also discussed importance of finding a balance: taking enough to find a tolerable pain level for therapy and not taking too much pain medication due to risk for lethargy, impaired balance and constipation.  Also discussed other options for pain management including heat, ice, exercise, etc.  Following PT evaluation pt educated on stair technique for home with R rail; demonstrated various options for pt.  Did not practice today due to pain.  Will begin to practice with one rail soon.  Pt returned to room and set up with lunch.     PM session: pt still in w/c after lunch.  Performed w/c mobility back to gym with supervision and extra time.  Pt transferred stand pivot w/c <> NUstep with RW and min A.  Pt set up on Nustep with min A and performed AAROM and strengthening on Nustep at level 5 x 8:30 minutes with first few minutes focusing on increasing hip flexion ROM.  Following Nustep pt performed gait x 40' with RW and min A with intermittent cues for initiation of L advancement with hip and knee flexion and intermittent assistance with balance secondary to tendency to shift COG forwards quickly.  Pt fatigued quickly.  Returned to w/c  and returned to room to visit with brother.  PT Evaluation Precautions/Restrictions Precautions Precautions: Fall Required Braces or Orthoses: Sling (sling for comfort? no formal order regarding wearing of sling or weightbearing status in the LUE) Restrictions Weight Bearing Restrictions: Yes LUE Weight Bearing: Weight bearing as tolerated LLE Weight Bearing: Weight bearing as tolerated General Chart Reviewed: Yes Response to Previous Treatment: Patient with no complaints from previous session. Family/Caregiver Present: No  Pain Pain Assessment Pain Score: 8  Pain Type: Acute pain Pain Location: Hip Pain Orientation: Left Pain Descriptors / Indicators: Sharp;Spasm Pain Onset: With Activity Pain Intervention(s): Cold applied;Rest;Repositioned Home Living/Prior Functioning Home Living Available Help at Discharge: Family;Available PRN/intermittently Type of Home: House Home Access: Stairs to enter [employer] of Steps: 4 Entrance Stairs-Rails: Right Home Layout: One level  Lives With: Spouse Prior Function Level of Independence: Independent with homemaking with ambulation;Independent with gait;Independent with transfers  Able to Take Stairs?: Yes Driving: Yes Vocation: Full time employment Comments: pt works as Programme researcher, broadcasting/film/video. Cognition Overall Cognitive Status: Within Functional Limits for tasks assessed Orientation Level: Oriented X4 Attention: Sustained;Selective Sustained Attention: Appears intact Selective Attention: Appears intact Memory: Appears intact Awareness: Appears intact Problem Solving: Appears intact Safety/Judgment: Appears intact Sensation Sensation Light Touch: Appears Intact Stereognosis: Appears Intact Hot/Cold: Appears Intact Proprioception: Appears Intact Additional Comments: intact sensation in bilateral UEs. Coordination Gross Motor Movements are Fluid and Coordinated: Yes Fine Motor Movements are Fluid and Coordinated:  Yes Coordination and Movement Description: Limited in LLE by pain Motor  Motor Motor: Other (comment) Motor - Skilled Clinical Observations: Generalized weakness due to pain/trauma  Mobility Bed Mobility Bed Mobility: Supine to Sit Supine to Sit: 1: +1 Total assist Supine to Sit Details (indicate cue type and reason): Pt performed supine >  sit from flat bed, no rails with total A with pt requiring assistance to advance LLE to EOB and total A to sit upright from flat position secondary to significant pain in trunk/hip and inability to flex at hip/trunk ; pt reports it is too painful to roll  Transfers Transfers: Yes Stand Pivot Transfers: 3: Mod assist Stand Pivot Transfer Details (indicate cue type and reason): Pt performed multiple stand pivots bed <> w/c and from elevated mat with mod lifting assistance and assistance to stabilize while transitioning UE from mat > RW; required min A for actual pivot once standing. Locomotion  Ambulation Ambulation/Gait Assistance: 4: Min assist Ambulation Distance (Feet): 20 Feet (x 2) Ambulation/Gait Assistance Details: Peformed gait x 20' x 2 reps with RW and extra time with step to gait sequence; verbal cues to initiate LLE advancement with hip and knee flexion + trunk rotation for more normal gait pattern vs. hip hike Gait Gait: Yes Gait Pattern: Impaired Gait Pattern: Step-to pattern;Decreased step length - left;Decreased stance time - left;Decreased hip/knee flexion - left;Left hip hike;Ataxic;Poor foot clearance - left Stairs / Additional Locomotion Stairs: Yes Stairs Assistance: 4: Min assist Stairs Assistance Details (indicate cue type and reason): Required min A for balance during L stance with mod verbal cues for safe stepping sequence Stair Management Technique: Two rails;Step to pattern;Forwards Number of Stairs: 3 Height of Stairs: 4 Architect: Yes Wheelchair Assistance: 5: Engineer, technical sales: Both upper extremities Wheelchair Parts Management: Needs assistance Distance: 150  Trunk/Postural Assessment  Cervical Assessment Cervical Assessment: Within Scientist, physiological Assessment: Within Functional Limits Lumbar Assessment Lumbar Assessment: Exceptions to [facility] (maintains posterior pelvic tilt secondary to pain) Postural Control Postural Control: Deficits on evaluation (impaired anterior pelvic tilt and weight shift over BOS)  Balance Balance Balance Assessed: Yes Static Sitting Balance Static Sitting - Balance Support: Right upper extremity supported;Left upper extremity supported;Feet supported Static Sitting - Level of Assistance: 5: Stand by assistance Dynamic Sitting Balance Dynamic Sitting - Balance Support: Right upper extremity supported;Left upper extremity supported;Feet supported Dynamic Sitting - Level of Assistance: 5: Stand by assistance Static Standing Balance Static Standing - Balance Support: Right upper extremity supported;Left upper extremity supported Static Standing - Level of Assistance: 4: Min assist Dynamic Standing Balance Dynamic Standing - Balance Support: Right upper extremity supported;Left upper extremity supported Dynamic Standing - Level of Assistance: 4: Min assist Extremity Assessment  RLE Assessment RLE Assessment: Within Functional Limits LLE Assessment LLE Assessment: Exceptions to [facility] LLE Strength LLE Overall Strength: Deficits;Due to pain LLE Overall Strength Comments: hip flexion 1/5, knee flexion/extension 4/5, ankle DF 5/5  FIM:  FIM - Bed/Chair Transfer Bed/Chair Transfer Assistive Devices: Adult nurse Transfer: 1: Supine > Sit: Total A (helper does all/Pt. < 25%);3: Bed > Chair or W/C: Mod A (lift or lower assist);3: Chair or W/C > Bed: Mod A (lift or lower assist) FIM - Locomotion: Wheelchair Distance: 150 Locomotion: Wheelchair: 5: Travels 150 ft or more: maneuvers on rugs and  over door sills with supervision, cueing or coaxing FIM - Locomotion: Ambulation Locomotion: Ambulation Assistive Devices: Administrator Ambulation/Gait Assistance: 4: Min assist Locomotion: Ambulation: 1: Travels less than 50 ft with minimal assistance (Pt.>75%) FIM - Locomotion: Stairs Locomotion: Scientist, physiological: Hand rail - 2 Locomotion: Stairs: 1: Up and Down < 4 stairs with minimal assistance (Pt.>75%)   Refer to Care Plan for Long Term Goals  Recommendations for other services: None  Discharge Criteria: Patient will be  discharged from PT if patient refuses treatment 3 consecutive times without medical reason, if treatment goals not met, if there is a change in medical status, if patient makes no progress towards goals or if patient is discharged from hospital.  The above assessment, treatment plan, treatment alternatives and goals were discussed and mutually agreed upon: by patient  Malachy Mood 03/09/2014, 12:53 PM

## 2014-03-09 NOTE — Progress Notes (Signed)
Subjective He states that he continues to have some left-sided flank pain. This worsens with movement. He also notes he has not had a bowel movement. He also notes that he has some pruritus on the back of his thighs and buttocks. This seems to extend up to his back. He also suggests that he is sweating some on his back and legs.     OBJ: BP 151/93 mmHg  Pulse 70  Temp(Src) 98.4 F (36.9 C) (Oral)  Resp 18  Ht 5\' 11"  (1.803 m)  Wt 185 lb (83.915 kg)  BMI 25.81 kg/m2  SpO2 97%  No acute distress. Chest clear to auscultation. Cardiac exam S1 and S2 are regular. Abdominal exam active bowel sounds, soft, nontender. Extremities there is no edema. Dermatologic exam: There is slight erythema of the posterior thighs and buttocks. There is no rash anywhere else.  Ap: Medical Problem List and Plan: 1. Functional deficits secondary to multitrauma after a fall from a ladder including left iliac wing fracture, left superior and inferior pubic rami fractures, left rib and clavicle fracture . 2. DVT Prophylaxis/Anticoagulation: Subcutaneous Lovenox. Monitor platelet counts any signs of bleeding 3. Pain Management: Oxycodone and Robaxin as needed. May consider scheduled narcotics. I have added scheduled Vicodin. 4. Urinary retent urocholine and Flomax as directed. Check PVRs 3. Monitor with increased mobility. Follow-up urine study 5. Neuropsych: This patient is capable of making decisions on hisown behalf. 6. Skin/Wound Care: Routine skin checks-I think he has somewhat of a irritation of his back and thighs secondary to perspiration and heat. 7. Constipation. I started Senokot. Patient is already on MiraLAX. We'll give one fleets enema.

## 2014-03-09 NOTE — Plan of Care (Signed)
Problem: RH SAFETY Goal: RH STG ADHERE TO SAFETY PRECAUTIONS W/ASSISTANCE/DEVICE STG Adhere to Safety Precautions With mod. Assistance/Device.  Outcome: Progressing

## 2014-03-09 NOTE — Evaluation (Signed)
Occupational Therapy Assessment and Plan  Patient Details  Name: Roger Wilson MRN: [id number] Date of Birth: Oct 10, 1955  OT Diagnosis: acute pain, muscle weakness (generalized) and pain in joint Rehab Potential: Rehab Potential (ACUTE ONLY): Excellent ELOS: 7-9 days   Today's Date: 03/09/2014 OT Individual Time: 0800-0900 OT Individual Time Calculation (min): 60 min     Problem List:  Patient Active Problem List   Diagnosis Date Noted  . Trauma 03/08/2014  . Acute urinary retention 03/07/2014  . Fall from roof 03/07/2014  . Left rib fracture 03/07/2014  . Pneumothorax, traumatic 03/07/2014  . Fracture of left iliac wing 03/07/2014  . Closed fracture of ramus of left pubis 03/07/2014  . Fall 03/04/2014  . CELLULITIS, HAND, LEFT 12/17/2009  . RESIDUAL FOREIGN BODY IN SOFT TISSUE 10/21/2009    Past Medical History:  Past Medical History  Diagnosis Date  . Environmental allergies   . Hypertension    Past Surgical History:  Past Surgical History  Procedure Laterality Date  . Knee arthroscopy  2002    left    Assessment & Plan Clinical Impression: Patient is a 58 y.o. year old male with recent admission to the hospital on11/17/2015 after a fall from a roof approximately 10 feet with questionable loss of consciousness. Patient was independent prior to admission living with his wife. Complaints of left shoulder pain chest pain and left hip pain. Cranial CT scan showed a high left parietal scalp hematoma without skull fracture or intracranial abnormality. CT abdomen and pelvis showed a left iliac wing fracture with left superior and inferior pubic rami fracture as well as retroperitoneal hematoma, left small pneumothorax, left first rib fracture and left clavicle fracture. Follow-up orthopedic services Dr. Marcelino Scot and currently weightbearing as tolerated left lower extremity and shoulder sling for left distal clavicle fracture question weightbearing left upper extremity.  Patient  transferred to CIR on 03/08/2014 .    Patient currently requires mod with basic self-care skills secondary to muscle weakness and muscle joint tightness and decreased standing balance.  Prior to hospitalization, patient could complete ADLs and IADLS with independent .  Patient will benefit from skilled intervention to decrease level of assist with basic self-care skills and increase independence with basic self-care skills prior to discharge home with care partner.  Anticipate patient will require intermittent supervision and no further OT follow recommended.   OT Evaluation Precautions/Restrictions  Precautions Precautions: Fall Required Braces or Orthoses: Sling (sling for comfort? no formal order regarding wearing of sling or weightbearing status in the LUE)   Pain Pain Assessment Pain Score: 8  Pain Type: Acute pain Pain Location: Hip Pain Orientation: Left Pain Descriptors / Indicators: Sharp;Spasm Pain Onset: With Activity Pain Intervention(s): Cold applied;Rest;Repositioned Home Living/Prior Functioning Home Living Living Arrangements: Spouse/significant other Available Help at Discharge: Family, Available PRN/intermittently Type of Home: House Home Access: Stairs to enter Technical brewer of Steps: 4 Entrance Stairs-Rails: Right Home Layout: One level  Lives With: Spouse IADL History Current License: Yes Occupation: Full time employment Prior Function Level of Independence: Independent with homemaking with ambulation, Independent with gait, Independent with transfers  Able to Take Stairs?: Yes Driving: Yes Vocation: Full time employment Comments: pt works as Programme researcher, broadcasting/film/video. ADL  See FIM scale for details  Vision/Perception  Vision- History Baseline Vision/History: Wears glasses Patient Visual Report: No change from baseline  Cognition Overall Cognitive Status: Within Functional Limits for tasks assessed Orientation Level: Oriented X4 Attention:  Sustained;Selective Sustained Attention: Appears intact Selective Attention: Appears intact Memory:  Appears intact Awareness: Appears intact Problem Solving: Appears intact Safety/Judgment: Appears intact Sensation Sensation Light Touch: Appears Intact Stereognosis: Appears Intact Hot/Cold: Appears Intact Proprioception: Appears Intact Additional Comments: intact sensation in bilateral UEs. Coordination Gross Motor Movements are Fluid and Coordinated:  (WFLs in UEs) Fine Motor Movements are Fluid and Coordinated: Yes Coordination and Movement Description: Limited in LLE by pain Motor  Motor Motor: Within Functional Limits Motor - Skilled Clinical Observations: Generalized weakness due to pain/trauma Mobility  Bed Mobility Bed Mobility: Supine to Sit Supine to Sit: 1: +1 Total assist Supine to Sit Details: Verbal cues for technique;Manual facilitation for placement;Manual facilitation for weight shifting Supine to Sit Details (indicate cue type and reason): Pt performed supine > sit from flat bed, no rails with total A with pt requiring assistance to advance LLE to EOB and total A to sit upright from flat position secondary to significant pain in trunk/hip and inability to flex at hip/trunk ; pt reports it is too painful to roll  Transfers Transfers: Sit to Stand Sit to Stand: 4: Min assist;From elevated surface;From bed;With upper extremity assist Sit to Stand Details: Verbal cues for technique;Manual facilitation for weight shifting Sit to Stand Details (indicate cue type and reason): Pt able to stand from the EOB with increased time.  Limited secondary to increased pain with weightbearing over the left hip.   Trunk/Postural Assessment  Cervical Assessment Cervical Assessment: Within Functional Limits Thoracic Assessment Thoracic Assessment: Within Functional Limits Lumbar Assessment Lumbar Assessment: Exceptions to [facility] (Pt with limited lumbar movement secondary to increased  pain.) Postural Control Postural Control: Deficits on evaluation Postural Limitations: Pt keeps weight shifted to the right in standing secondary to left hip/pelvis pain.  Balance Balance Balance Assessed: Yes Static Sitting Balance Static Sitting - Balance Support: Right upper extremity supported;Left upper extremity supported;Feet supported Static Sitting - Level of Assistance: 5: Stand by assistance Dynamic Sitting Balance Dynamic Sitting - Balance Support: Right upper extremity supported;Left upper extremity supported;Feet supported Dynamic Sitting - Level of Assistance: 5: Stand by assistance Static Standing Balance Static Standing - Balance Support: Right upper extremity supported;Left upper extremity supported Static Standing - Level of Assistance: 4: Min assist Dynamic Standing Balance Dynamic Standing - Balance Support: Right upper extremity supported;Left upper extremity supported Dynamic Standing - Level of Assistance: 4: Min assist Extremity/Trunk Assessment RUE Assessment RUE Assessment: Within Functional Limits LUE Assessment LUE Assessment: Exceptions to [facility] (Elbow and digit AROM and strength WFLs, shoulder flexion AROM 0-120 degrees but did not test strength or further AROM secondary to distal clavicle fracture and no clear guidelines available. )  FIM:  FIM - Eating Eating Activity: 7: Complete independence:no helper FIM - Grooming Grooming Steps: Wash, rinse, dry face;Wash, rinse, dry hands Grooming: 5: Supervision: safety issues or verbal cues FIM - Bathing Bathing Steps Patient Completed: Chest;Right Arm;Left Arm;Abdomen;Front perineal area;Buttocks;Right upper leg;Left upper leg Bathing: 4: Min-Patient completes 8-9 26f 10 parts or 75+ percent FIM - Upper Body Dressing/Undressing Upper body dressing/undressing: 0: Activity did not occur FIM - Lower Body Dressing/Undressing Lower body dressing/undressing: 1: Total-Patient completed less than 25% of tasks (For  gripper socks only, pt with no other clothing.) FIM - Bed/Chair Transfer Bed/Chair Transfer Assistive Devices: Adult nurse Transfer: 1: Supine > Sit: Total A (helper does all/Pt. < 25%);3: Bed > Chair or W/C: Mod A (lift or lower assist) FIM - Tub/Shower Transfers Tub/shower Transfers: 0-Activity did not occur or was simulated   Refer to Care Plan for Long Term Goals  Recommendations for other services: None  Discharge Criteria: Patient will be discharged from OT if patient refuses treatment 3 consecutive times without medical reason, if treatment goals not met, if there is a change in medical status, if patient makes no progress towards goals or if patient is discharged from hospital.  The above assessment, treatment plan, treatment alternatives and goals were discussed and mutually agreed upon: by patient   Began work on selfcare re-training sit to stand at the EOB.  Pt with increased pain with transitional movements to the EOB as well as when sitting up from supine with trunk.  Once sitting he was able to tolerate well and performed most UB selfcare with just setup.  Decreased ability to reach his feet so therapist had to assist with washing them and donning TEDs and gripper socks.  Min assist with increased time for sit to stand when washing peri area.  He was able to maintain standing with min assist during washing.  Increased rash noted on buttocks and back of legs with water blisters present on the left buttocks.  Pt reporting increased itching so nursing made aware of situation.  He was able to transfer to the bedside chair with min assist to conclude session.  Annaelle Kasel OTR/L 03/09/2014, 4:23 PM

## 2014-03-09 NOTE — H&P (Signed)
Physical Medicine and Rehabilitation Admission H&P   Chief Complaint  Patient presents with  . Fall  . Leg Pain  . Chest Pain  : HPI: Roger Wilson is a 58 y.o. right handed male admitted 03/04/2014 after a fall from a roof approximately 10 feet with questionable loss of consciousness. Patient was independent prior to admission living with his wife. Complaints of left shoulder pain chest pain and left hip pain. Cranial CT scan showed a high left parietal scalp hematoma without skull fracture or intracranial abnormality. CT abdomen and pelvis showed a left iliac wing fracture with left superior and inferior pubic rami fracture as well as retroperitoneal hematoma, left small pneumothorax, left first rib fracture and left clavicle fracture. Follow-up orthopedic services Dr. Marcelino Scot and currently weightbearing as tolerated left lower extremity and shoulder sling for left distal clavicle fracture question weightbearing left upper extremity. CT 3-D of pelvis shows multiple fractures involving left iliac wing remaining external to the left acetabulum extending in a nondisplaced fracture plane into the left SI joint as well as fractures left superior and inferior pubic rami. Hospital course pain management. Subcutaneous Lovenox added for DVT prophylaxis. Bouts of urinary retention and monitored his mobility improves currently maintained on urecholine. Physical therapy evaluation completed with recommendations of physical medicine rehabilitation consult.. Patient was admitted for comprehensive rehabilitation program  ROS Review of Systems  All other systems reviewed and are negative   Past Medical History  Diagnosis Date  . Environmental allergies   . Hypertension    Past Surgical History  Procedure Laterality Date  . Knee arthroscopy  2002    left   Family History  Problem Relation Age of Onset  . Colon cancer Neg Hx   . Stomach cancer Neg Hx     Social History:  reports that he has never smoked. He has never used smokeless tobacco. He reports that he drinks about 4.2 oz of alcohol per week. He reports that he does not use illicit drugs. Allergies:  Allergies  Allergen Reactions  . Morphine     Veins turn red after injection   Medications Prior to Admission  Medication Sig Dispense Refill  . Ascorbic Acid (VITAMIN C PO) Take by mouth daily.    Marland Kitchen aspirin 81 MG tablet Take 81 mg by mouth daily.    . Calcium Carbonate-Vitamin D (CALCIUM + D PO) Take by mouth daily.    . Cyanocobalamin (B-12 PO) Take by mouth daily.    . fish oil-omega-3 fatty acids 1000 MG capsule Take by mouth daily.    . Loratadine (CLARITIN PO) Take 10 mg by mouth daily.     . Multiple Vitamin (MULTIVITAMIN) tablet Take 1 tablet by mouth daily.      Home: Home Living Family/patient expects to be discharged to:: Inpatient rehab Living Arrangements: Spouse/significant other  Functional History: Prior Function Level of Independence: Independent Comments: pt works as Programme researcher, broadcasting/film/video.  Functional Status:  Mobility: Bed Mobility Overal bed mobility: Needs Assistance, +2 for physical assistance Bed Mobility: Supine to Sit Supine to sit: Max assist, +2 for physical assistance, HOB elevated General bed mobility comments: pt does participate with moving LEs towards EOB, but then 2/2 pain becomes total A x2 to complete coming to sitting. pt screaming out during 2nd half of mobility, but once in sitting pt calm again.  Transfers Overall transfer level: Needs assistance Equipment used: 2 person hand held assist Transfers: Sit to/from Stand, Stand Pivot Transfers Sit to Stand: Mod assist, +  2 physical assistance Stand pivot transfers: Max assist, +2 physical assistance General transfer comment: pt needs cues and encouragement to participate in mobility as pt reluctent to use either UE and L LE. Once in  standing pt only a MinA x2 to maintain standing, but has difficulty pivoting on R LE towards chair. Does not attempt to move L LE, only drags it.       ADL:    Cognition: Cognition Overall Cognitive Status: Within Functional Limits for tasks assessed Orientation Level: Oriented X4 Cognition Arousal/Alertness: Awake/alert Behavior During Therapy: WFL for tasks assessed/performed Overall Cognitive Status: Within Functional Limits for tasks assessed  Physical Exam: Blood pressure 126/73, pulse 60, temperature 98.1 F (36.7 C), temperature source Oral, resp. rate 14, height 5\' 11"  (1.803 m), weight 80.831 kg (178 lb 3.2 oz), SpO2 95 %. Physical Exam Constitutional: He appears well-developed.  HENT:  Head: Normocephalic.  Eyes: EOM are normal.  Neck: Normal range of motion. Neck supple. No thyromegaly present.  Cardiovascular: Normal rate and regular rhythm.  Respiratory: Effort normal and breath sounds normal. No respiratory distress.  GI: Soft. Bowel sounds are normal. He exhibits no distension.  Neurological:  Patient appears a bit sedated but was cooperative with exam. He makes good eye contact with examiner. Oriented 3 and follows commands  Skin: Skin is warm and dry Patient moving all extremities. Neurovascular sensation intact     Lab Results Last 48 Hours    No results found for this or any previous visit (from the past 48 hour(s)).    Imaging Results (Last 48 hours)    Dg Pelvis Comp Min 3v  03/05/2014 CLINICAL DATA: Fall off roof yesterday, pelvic fracture, left side pain EXAM: JUDET PELVIS - 3+ VIEW COMPARISON: 03/04/2014 FINDINGS: Three views of the pelvis submitted including Judet view. Again noted Mild displaced comminuted fracture of the left iliac wing. There is mild displaced fracture of left inferior pubic ramus. Nondisplaced fracture of the left superior pubic ramus. Again noted a transverse fracture line in left iliac bone progressing to left  SI joint. IMPRESSION: Again noted Mild displaced post traumatic comminuted fracture of the left iliac wing. There is mild displaced fracture of left inferior pubic ramus. Nondisplaced fracture of the left superior pubic ramus. Again noted a transverse fracture line in left iliac bone progressing to left SI joint. Electronically Signed By: Lahoma Crocker M.D. On: 03/05/2014 09:46   Dg Chest Port 1 View  03/05/2014 CLINICAL DATA: 58 year old male with left pneumothorax status post fall from roof. Initial encounter. EXAM: PORTABLE CHEST - 1 VIEW COMPARISON: Chest CT 03/04/2014 and earlier. FINDINGS: Portable AP semi upright view at 0536 hrs. Small left pneumothorax has not significantly changed. Minimally displaced distal left clavicle fracture re- identified. Lower lung volumes. Stable cardiac size and mediastinal contours. No pulmonary edema, pleural effusion or pulmonary contusion identified. Visualized tracheal air column is within normal limits. IMPRESSION: 1. Stable small left pneumothorax. 2. No new cardiopulmonary abnormality. Electronically Signed By: Lars Pinks M.D. On: 03/05/2014 07:14        Medical Problem List and Plan: 1. Functional deficits secondary to multitrauma after a fall from a ladder including left iliac wing fracture, left superior and inferior pubic rami fractures, left rib and clavicle fracture . 2. DVT Prophylaxis/Anticoagulation: Subcutaneous Lovenox. Monitor platelet counts any signs of bleeding 3. Pain Management: Oxycodone and Robaxin as needed. May consider scheduled narcotics 4. Urinary retention. Continue your choline and Flomax as directed. Check PVRs 3. Monitor with increased mobility. Follow-up  urine study 5. Neuropsych: This patient is capable of making decisions on his own behalf. 6. Skin/Wound Care: Routine skin checks 7. Fluids/Electrolytes/Nutrition: Strict I and O's. Follow-up chemistries. Provide nutritional supplements as  needed      Post Admission Physician Evaluation: 1. Functional deficits secondary to polytrauma. 2. Patient is admitted to receive collaborative, interdisciplinary care between the physiatrist, rehab nursing staff, and therapy team. 3. Patient's level of medical complexity and substantial therapy needs in context of that medical necessity cannot be provided at a lesser intensity of care such as a SNF. 4. Patient has experienced substantial functional loss from his/her baseline which was documented above under the "Functional History" and "Functional Status" headings. Judging by the patient's diagnosis, physical exam, and functional history, the patient has potential for functional progress which will result in measurable gains while on inpatient rehab. These gains will be of substantial and practical use upon discharge in facilitating mobility and self-care at the household level. 5. Physiatrist will provide 24 hour management of medical needs as well as oversight of the therapy plan/treatment and provide guidance as appropriate regarding the interaction of the two. 6. 24 hour rehab nursing will assist with bladder management, bowel management, safety, skin/wound care, disease management, medication administration, pain management and patient education and help integrate therapy concepts, techniques,education, etc. 7. PT will assess and treat for/with: Lower extremity strength, range of motion, stamina, balance, functional mobility, safety, adaptive techniques and equipment, ortho, pain. Goals are: supervision to min assist. 8. OT will assess and treat for/with: ADL's, functional mobility, safety, upper extremity strength, adaptive techniques and equipment, ortho, pain. Goals are: supervision to min assist. Therapy may not yet proceed with showering this patient. 9. SLP will assess and treat for/with: n/a. Goals are: n/a. 10. Case Management and Social Worker will assess and treat for  psychological issues and discharge planning. 11. Team conference will be held weekly to assess progress toward goals and to determine barriers to discharge. 12. Patient will receive at least 3 hours of therapy per day at least 5 days per week. 13. ELOS: 12-16 days  14. Prognosis: good     Meredith Staggers, MD, [place] [facility] 03/09/2014

## 2014-03-09 NOTE — Progress Notes (Signed)
*  PRELIMINARY RESULTS* Vascular Ultrasound Lower extremity venous duplex has been completed.  Preliminary findings: no evidence of DVT  Landry Mellow, RDMS, RVT  03/09/2014, 10:52 AM

## 2014-03-09 NOTE — Plan of Care (Signed)
Problem: RH BOWEL ELIMINATION Goal: RH STG MANAGE BOWEL WITH ASSISTANCE STG Manage Bowel with min. Assistance.  Outcome: Not Progressing

## 2014-03-09 NOTE — Plan of Care (Signed)
Problem: RH BLADDER ELIMINATION Goal: RH STG MANAGE BLADDER WITH MEDICATION WITH ASSISTANCE STG Manage Bladder With Medication With min. Assistance.  Outcome: Not Progressing

## 2014-03-09 NOTE — Plan of Care (Signed)
Problem: RH SKIN INTEGRITY Goal: RH STG SKIN FREE OF INFECTION/BREAKDOWN With min.assist  Outcome: Progressing

## 2014-03-09 NOTE — Progress Notes (Signed)
Occupational Therapy Session Note  Patient Details  Name: Nichael Ehly MRN: [id number] Date of Birth: 07-03-1955  Today's Date: 03/09/2014 OT Individual Time: 1455-1535 OT Individual Time Calculation (min): 40 min    Skilled Therapeutic Interventions/Progress Updates:   Pt was seen for pm OT session with wife present.  Discussed his current level for assistance and anticipated LOS on rehab.  Progressed to educating pt and wife on AE use for LB selfcare.  Pt was able to return demonstrate use of the reacher and sockaide for LB dressing.  Also discussed use of a LH sponge and adaptive shoe strings.  Pt performed sit to stand from the wheelchair and therapist helped apply cream to his backside and legs to decrease itching.  Had pt ambulate out the room door and back to further look at balance and mobility.  He was able to perform ambulation with increased time using the RW.  Short step length noted on the left side with decreased ability to tolerate weightbearing.  Returned to wheelchair at end of session with call [place] in place.  Nursing notified of pt's request for pain meds.   Therapy Documentation Precautions:  Precautions Precautions: Fall Required Braces or Orthoses: Sling (sling for comfort? no formal order regarding wearing of sling or weightbearing status in the LUE) Restrictions Weight Bearing Restrictions: Yes LUE Weight Bearing: Weight bearing as tolerated LLE Weight Bearing: Weight bearing as tolerated  Pain: Pain Assessment Pain Assessment: Faces Pain Score: 6  Faces Pain Scale: Hurts little more Pain Type: Acute pain Pain Location: Hip Pain Orientation: Left Pain Descriptors / Indicators: Aching Pain Onset: With Activity Pain Intervention(s): Medication (See eMAR) Multiple Pain Sites: No ADL: See FIM for current functional status  Therapy/Group: Individual Therapy  Amauria Younts OTR/L 03/09/2014, 4:34 PM

## 2014-03-10 ENCOUNTER — Inpatient Hospital Stay (HOSPITAL_COMMUNITY): Payer: BC Managed Care – PPO | Admitting: Physical Therapy

## 2014-03-10 ENCOUNTER — Inpatient Hospital Stay (HOSPITAL_COMMUNITY): Payer: BC Managed Care – PPO | Admitting: Occupational Therapy

## 2014-03-10 DIAGNOSIS — S42002D Fracture of unspecified part of left clavicle, subsequent encounter for fracture with routine healing: Secondary | ICD-10-CM

## 2014-03-10 DIAGNOSIS — S2232XD Fracture of one rib, left side, subsequent encounter for fracture with routine healing: Secondary | ICD-10-CM

## 2014-03-10 DIAGNOSIS — S32592D Other specified fracture of left pubis, subsequent encounter for fracture with routine healing: Secondary | ICD-10-CM

## 2014-03-10 DIAGNOSIS — S32512D Fracture of superior rim of left pubis, subsequent encounter for fracture with routine healing: Secondary | ICD-10-CM

## 2014-03-10 LAB — COMPREHENSIVE METABOLIC PANEL
ALT: 47 U/L (ref 0–53)
AST: 59 U/L — AB (ref 0–37)
Albumin: 2.7 g/dL — ABNORMAL LOW (ref 3.5–5.2)
Alkaline Phosphatase: 46 U/L (ref 39–117)
Anion gap: 11 (ref 5–15)
BUN: 13 mg/dL (ref 6–23)
CO2: 27 meq/L (ref 19–32)
CREATININE: 0.76 mg/dL (ref 0.50–1.35)
Calcium: 8.5 mg/dL (ref 8.4–10.5)
Chloride: 100 mEq/L (ref 96–112)
GFR calc Af Amer: >90 mL/min (ref >90)
Glucose, Bld: 99 mg/dL (ref 70–99)
Potassium: 4.4 mEq/L (ref 3.7–5.3)
SODIUM: 138 meq/L (ref 137–147)
Total Bilirubin: 0.9 mg/dL (ref 0.3–1.2)
Total Protein: 5.9 g/dL — ABNORMAL LOW (ref 6.0–8.3)

## 2014-03-10 LAB — CBC WITH DIFFERENTIAL/PLATELET
BASOS ABS: 0 10*3/uL (ref 0.0–0.1)
Basophils Relative: 0 % (ref 0–1)
Eosinophils Absolute: 0.8 10*3/uL — ABNORMAL HIGH (ref 0.0–0.7)
Eosinophils Relative: 14 % — ABNORMAL HIGH (ref 0–5)
HEMATOCRIT: 36.3 % — AB (ref 39.0–52.0)
Hemoglobin: 12.7 g/dL — ABNORMAL LOW (ref 13.0–17.0)
LYMPHS ABS: 1.4 10*3/uL (ref 0.7–4.0)
Lymphocytes Relative: 23 % (ref 12–46)
MCH: 29.7 pg (ref 26.0–34.0)
MCHC: 35 g/dL (ref 30.0–36.0)
MCV: 84.8 fL (ref 78.0–100.0)
Monocytes Absolute: 0.8 10*3/uL (ref 0.1–1.0)
Monocytes Relative: 13 % — ABNORMAL HIGH (ref 3–12)
NEUTROS ABS: 2.9 10*3/uL (ref 1.7–7.7)
Neutrophils Relative %: 50 % (ref 43–77)
Platelets: 153 10*3/uL (ref 150–400)
RBC: 4.28 MIL/uL (ref 4.22–5.81)
RDW: 12.4 % (ref 11.5–15.5)
WBC: 5.8 10*3/uL (ref 4.0–10.5)

## 2014-03-10 MED ORDER — SENNOSIDES-DOCUSATE SODIUM 8.6-50 MG PO TABS
2.0000 | ORAL_TABLET | Freq: Two times a day (BID) | ORAL | Status: AC
  Administered 2014-03-10 (×2): 2 via ORAL
  Filled 2014-03-10: qty 1
  Filled 2014-03-10: qty 2

## 2014-03-10 MED ORDER — DIPHENHYDRAMINE HCL 25 MG PO CAPS
25.0000 mg | ORAL_CAPSULE | Freq: Four times a day (QID) | ORAL | Status: DC | PRN
  Administered 2014-03-10 – 2014-03-11 (×2): 25 mg via ORAL
  Filled 2014-03-10 (×2): qty 1

## 2014-03-10 MED ORDER — DIPHENHYDRAMINE HCL 50 MG/ML IJ SOLN: 25.0000 mg | Freq: Four times a day (QID) | INTRAMUSCULAR | Status: DC | PRN

## 2014-03-10 MED ORDER — OXYCODONE HCL ER 10 MG PO T12A
10.0000 mg | EXTENDED_RELEASE_TABLET | Freq: Two times a day (BID) | ORAL | Status: DC
  Administered 2014-03-10 – 2014-03-14 (×9): 10 mg via ORAL
  Filled 2014-03-10 (×9): qty 1

## 2014-03-10 NOTE — Progress Notes (Signed)
Occupational Therapy Session Notes  Patient Details  Name: Norvel Wenker MRN: [id number] Date of Birth: 1956-03-09  Today's Date: 03/10/2014 OT Individual Time: 0900-1035 and 137-217 OT Individual Time Calculation (min): 95 min and 40 min    Short Term Goals: Week 1:  OT Short Term Goal 1 (Week 1): STGs equal to LTGs set at modified independent to supervision based on ELOS.  Skilled Therapeutic Interventions/Progress Updates:  1)  Patient resting in bed upon arrival.  Engaged in self care retraining to include toilet transfer, toileting, shower transfer, shower, dress, and groom.  Focused session on activity tolerance, adaptive techniques, use of AE to improve independence, functional mobility during BADL, dynamic standing, standing tolerance, and sit><stands.  Patient insisted that this OT provide max-total assist to pull patient from semi-reclined in bed to sit EOB.  Told patient that this amount of assistance is against hospital policy and that he will be learning other techniques to get out of bed with less assistance from staff/caregiver.  Patient reluctant to agree that there was any other way to achieve this transition.  Patient ambulated with supervision to toilet with 3in1 placed over toilet, transferred to tub bench in shower and performed shower in sit and in stand using grab bar, handle on tub bench, LH sponge, and HH shower.  Patient's RN called in to look at the rash on his back buttocks, back of both legs and under left armpit.  Ointment was applied by this OT then patient dressed using reacher and LH shoe horn.  Ambulated to sink and stood to shave, trim beard, brush teeth and brush hair.  Patient resting in recliner.  2)  Patient resting in w/c upon arrival.  Engaged in w/c><toilet transfer, toileting, functional mobility with RW and recliner transfer.  Focused session on activity tolerance, pain management, reviewed OT goals and need for patient to practice method for getting in  and out of bed that results in minimal pain and does not require the caregiver to provide so much assistance.  Patient requested to use w/c to go to the bathroom secondary he states he has done a lot of walking today and he use the walker for stand step to 3in1 over toilet.  Patient agreed to ambulate to recliner to rest instead of getting back into bed.  This OT placed a draw sheet on his bed to absorb moisture to replace bed pad.  Encouraged patient to either write down time and type medication given so he can assist with pain management.  When RN gave him medication-he wrote it down.  Therapy Documentation Precautions:  Precautions Precautions: Fall Required Braces or Orthoses: Sling (sling for comfort? no formal order regarding wearing of sling or weightbearing status in the LUE) Restrictions Weight Bearing Restrictions: Yes LUE Weight Bearing: Weight bearing as tolerated LLE Weight Bearing: Weight bearing as tolerated Pain: 1) 6/10 "whole body-especially the left hip" at rest, 8/10 after activity. Premedicated, rest and repositioned and additional medication provided near end of session 2) 7/10 left hip, rest repositioned, premedicated and RN provided medication near end of session. ADL: See FIM for current functional status  Therapy/Group: Individual Therapy  Camiah Humm, [place] 03/10/2014, 9:35 AM

## 2014-03-10 NOTE — Progress Notes (Signed)
Physical Medicine and Rehabilitation Consult Reason for Consult: Multitrauma after a fall Referring Physician: Trauma services   HPI: Roger Wilson is a 58 y.o. right handed male admitted 03/04/2014 after a fall from a roof approximately 10 feet with questionable loss of consciousness. Patient was independent prior to admission living with his wife. Complaints of left shoulder pain chest pain and left hip pain. Cranial CT scan showed a high left parietal scalp hematoma without skull fracture or intracranial abnormality. CT abdomen and pelvis showed a left iliac wing fracture with left superior and inferior pubic rami fracture as well as retroperitoneal hematoma, left small pneumothorax, left first rib fracture and left clavicle fracture. Follow-up orthopedic services Dr. Marcelino Scot and currently weightbearing as tolerated left lower extremity and shoulder sling for left distal clavicle fracture question weightbearing left upper extremity. CT 3-D of pelvis shows multiple fractures involving left iliac wing remaining external to the left acetabulum extending in a nondisplaced fracture plane into the left SI joint as well as fractures left superior and inferior pubic rami. Hospital course pain management. Physical and occupational therapy evaluations pending. M.D. has requested physical medicine rehabilitation consult.   Review of Systems  All other systems reviewed and are negative.  Past Medical History  Diagnosis Date  . Environmental allergies   . Hypertension    Past Surgical History  Procedure Laterality Date  . Knee arthroscopy  2002    left   Family History  Problem Relation Age of Onset  . Colon cancer Neg Hx   . Stomach cancer Neg Hx    Social History:  reports that he has never smoked. He has never used smokeless tobacco. He reports that he drinks about 4.2 oz of alcohol per week. He reports that he does not use illicit drugs. Allergies:   Allergies  Allergen Reactions  . Morphine     Veins turn red after injection   Medications Prior to Admission  Medication Sig Dispense Refill  . Ascorbic Acid (VITAMIN C PO) Take by mouth daily.    Marland Kitchen aspirin 81 MG tablet Take 81 mg by mouth daily.    . Calcium Carbonate-Vitamin D (CALCIUM + D PO) Take by mouth daily.    . Cyanocobalamin (B-12 PO) Take by mouth daily.    . fish oil-omega-3 fatty acids 1000 MG capsule Take by mouth daily.    . Loratadine (CLARITIN PO) Take 10 mg by mouth daily.     . Multiple Vitamin (MULTIVITAMIN) tablet Take 1 tablet by mouth daily.      Home: Home Living Family/patient expects to be discharged to:: Private residence Living Arrangements: Spouse/significant other  Functional History:   Functional Status:  Mobility:          ADL:    Cognition: Cognition Orientation Level: Oriented X4    Blood pressure 135/79, pulse 76, temperature 97.9 F (36.6 C), temperature source Oral, resp. rate 20, height 5\' 11"  (1.803 m), weight 82.4 kg (181 lb 10.5 oz), SpO2 96 %. Physical Exam  Constitutional: He appears well-developed.  HENT:  Head: Normocephalic.  Eyes: EOM are normal.  Neck: Normal range of motion. Neck supple. No thyromegaly present.  Cardiovascular: Normal rate and regular rhythm.  Respiratory: Effort normal and breath sounds normal. No respiratory distress.  GI: Soft. Bowel sounds are normal. He exhibits no distension.  Neurological:  Patient appears a bit sedated but was cooperative with exam. He makes good eye contact with examiner. Oriented 3 and follows commands  Skin: Skin is warm and dry.     Lab Results Last 24 Hours    No results found for this or any previous visit (from the past 24 hour(s)).    Imaging Results (Last 48 hours)    Ct Head Wo Contrast  03/04/2014 CLINICAL DATA: [place] off of a roof. Hit head. EXAM: CT HEAD WITHOUT CONTRAST CT CERVICAL SPINE  WITHOUT CONTRAST TECHNIQUE: Multidetector CT imaging of the head and cervical spine was performed following the standard protocol without intravenous contrast. Multiplanar CT image reconstructions of the cervical spine were also generated. COMPARISON: Head CT from 2005. FINDINGS: CT HEAD FINDINGS There is a sizable left high parietal scalp hematoma without underlying skull fracture. No radiopaque foreign body. No acute intracranial findings. No subdural hematoma, infarct or intracranial hemorrhage. The brainstem and cerebellum are unremarkable. 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 CT CERVICAL SPINE FINDINGS Normal alignment of the cervical vertebral bodies. Disc spaces and vertebral bodies are maintained. No acute fracture or abnormal prevertebral soft tissue swelling. The facets are normally aligned. The skullbase C1 and C1-2 articulations are maintained. The dens is normal. No large disc protrusions, spinal or foraminal stenosis. The lung apices are clear. A small left apical pneumothorax is noted. IMPRESSION: 1. High left parietal scalp hematoma without underlying skull fracture. 2. No acute intracranial findings. 3. Normal alignment of the cervical vertebral bodies and no acute cervical spine fracture. 4. Small left apical pneumothorax Electronically Signed By: Kalman Jewels M.D. On: 03/04/2014 14:58   Ct Chest W Contrast  03/04/2014 CLINICAL DATA: [place] off a roof. Pelvic fractures and left-sided pneumothorax. EXAM: CT CHEST, ABDOMEN, AND PELVIS WITH CONTRAST TECHNIQUE: Multidetector CT imaging of the chest, abdomen and pelvis was performed following the standard protocol during bolus administration of intravenous contrast. CONTRAST: 51mL OMNIPAQUE IOHEXOL 300 MG/ML SOLN COMPARISON: CT scan 12/21/2011. FINDINGS: CT CHEST FINDINGS Chest wall: There is a distal left clavicle fracture noted. The sternum is intact. No thoracic vertebral body fractures are identified. There are  normally aligned. Suspect a subtle nondisplaced fracture involving the anterior aspect of the first left rib. This is best seen on the sagittal reformatted images. On the right side there appears to be a small nondisplaced fracture near the first rib articulation with the sternum. There is an associated small substernal hematoma. Mediastinum: The heart is normal in size. No pericardial effusion. There is a stable benign epicardial cyst on the right side. The aorta and branch vessels are patent. No dissection. No mediastinal hematoma, mass or adenopathy. The esophagus is grossly normal. Lungs: There is a small left apical pneumothorax. Dependent bibasilar atelectasis but no pulmonary contusions. No pleural effusion. CT ABDOMEN AND PELVIS FINDINGS The solid abdominal organs are intact. No acute injury is identified. The gallbladder is normal. No common bowel duct dilatation. No mesenteric or retroperitoneal mass or hematoma. The stomach, duodenum, small bowel and colon are grossly normal without oral contrast. No free air or free fluid. The bladder, prostate gland and seminal vesicles are normal. No pelvic mass or intrapelvic hematoma or fluid. There is a comminuted fracture involving the left iliac wing extending all the way down to just above the superior and anterior aspect of the acetabulum. It does not involve the acetabulum itself. Both hips are normally located. The pubic symphysis and SI joints are intact. There are superior and inferior pubic rami fractures on the left side. There may be a small anterior sacral fracture on image number 104. Associated hematoma is noted in the iliacus  muscle and gluteus minimus muscles. The lumbar vertebral bodies are normally aligned. No transverse process fractures. IMPRESSION: 1. Small left-sided pneumothorax. No pulmonary contusion or displaced rib fractures. 2. Distal left clavicle fracture and nondisplaced subtle first left rib fracture anteriorly. 3. Suspect a  small fracture near the first rib articulation with the sternum with an associated small substernal hematoma. 4. Normal heart and great vessels. 5. Intact solid abdominal organs. 6. Comminuted fracture involving the left iliac wing. No involvement of the left acetabulum or left SI joint. 7. Superior and inferior pubic rami fractures on the left. The pubic symphysis is intact. 8. Moderate-sized hematoma involving the left iliacus muscle and gluteus minimus muscle. Electronically Signed By: Kalman Jewels M.D. On: 03/04/2014 15:11   Ct Cervical Spine Wo Contrast  03/04/2014 CLINICAL DATA: [place] off of a roof. Hit head. EXAM: CT HEAD WITHOUT CONTRAST CT CERVICAL SPINE WITHOUT CONTRAST TECHNIQUE: Multidetector CT imaging of the head and cervical spine was performed following the standard protocol without intravenous contrast. Multiplanar CT image reconstructions of the cervical spine were also generated. COMPARISON: Head CT from 2005. FINDINGS: CT HEAD FINDINGS There is a sizable left high parietal scalp hematoma without underlying skull fracture. No radiopaque foreign body. No acute intracranial findings. No subdural hematoma, infarct or intracranial hemorrhage. The brainstem and cerebellum are unremarkable. 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 CT CERVICAL SPINE FINDINGS Normal alignment of the cervical vertebral bodies. Disc spaces and vertebral bodies are maintained. No acute fracture or abnormal prevertebral soft tissue swelling. The facets are normally aligned. The skullbase C1 and C1-2 articulations are maintained. The dens is normal. No large disc protrusions, spinal or foraminal stenosis. The lung apices are clear. A small left apical pneumothorax is noted. IMPRESSION: 1. High left parietal scalp hematoma without underlying skull fracture. 2. No acute intracranial findings. 3. Normal alignment of the cervical vertebral bodies and no acute cervical spine fracture. 4. Small left  apical pneumothorax Electronically Signed By: Kalman Jewels M.D. On: 03/04/2014 14:58   Ct Abdomen Pelvis W Contrast  03/04/2014 CLINICAL DATA: [place] off a roof. Pelvic fractures and left-sided pneumothorax. EXAM: CT CHEST, ABDOMEN, AND PELVIS WITH CONTRAST TECHNIQUE: Multidetector CT imaging of the chest, abdomen and pelvis was performed following the standard protocol during bolus administration of intravenous contrast. CONTRAST: 45mL OMNIPAQUE IOHEXOL 300 MG/ML SOLN COMPARISON: CT scan 12/21/2011. FINDINGS: CT CHEST FINDINGS Chest wall: There is a distal left clavicle fracture noted. The sternum is intact. No thoracic vertebral body fractures are identified. There are normally aligned. Suspect a subtle nondisplaced fracture involving the anterior aspect of the first left rib. This is best seen on the sagittal reformatted images. On the right side there appears to be a small nondisplaced fracture near the first rib articulation with the sternum. There is an associated small substernal hematoma. Mediastinum: The heart is normal in size. No pericardial effusion. There is a stable benign epicardial cyst on the right side. The aorta and branch vessels are patent. No dissection. No mediastinal hematoma, mass or adenopathy. The esophagus is grossly normal. Lungs: There is a small left apical pneumothorax. Dependent bibasilar atelectasis but no pulmonary contusions. No pleural effusion. CT ABDOMEN AND PELVIS FINDINGS The solid abdominal organs are intact. No acute injury is identified. The gallbladder is normal. No common bowel duct dilatation. No mesenteric or retroperitoneal mass or hematoma. The stomach, duodenum, small bowel and colon are grossly normal without oral contrast. No free air or free fluid. The bladder, prostate gland and seminal vesicles are  normal. No pelvic mass or intrapelvic hematoma or fluid. There is a comminuted fracture involving the left iliac wing extending all the  way down to just above the superior and anterior aspect of the acetabulum. It does not involve the acetabulum itself. Both hips are normally located. The pubic symphysis and SI joints are intact. There are superior and inferior pubic rami fractures on the left side. There may be a small anterior sacral fracture on image number 104. Associated hematoma is noted in the iliacus muscle and gluteus minimus muscles. The lumbar vertebral bodies are normally aligned. No transverse process fractures. IMPRESSION: 1. Small left-sided pneumothorax. No pulmonary contusion or displaced rib fractures. 2. Distal left clavicle fracture and nondisplaced subtle first left rib fracture anteriorly. 3. Suspect a small fracture near the first rib articulation with the sternum with an associated small substernal hematoma. 4. Normal heart and great vessels. 5. Intact solid abdominal organs. 6. Comminuted fracture involving the left iliac wing. No involvement of the left acetabulum or left SI joint. 7. Superior and inferior pubic rami fractures on the left. The pubic symphysis is intact. 8. Moderate-sized hematoma involving the left iliacus muscle and gluteus minimus muscle.  Electronically Signed By: Kalman Jewels M.D. On: 03/04/2014 15:11   Dg Pelvis Portable  03/04/2014 ADDENDUM REPORT: 03/04/2014 14:54 ADDENDUM: Small left apical pneumothorax is suspected. Electronically Signed By: Kalman Jewels M.D. On: 03/04/2014 14:54   03/04/2014 CLINICAL DATA: [place] off a roof. EXAM: PORTABLE CHEST - 1 VIEW; PORTABLE PELVIS 1-2 VIEWS COMPARISON: 06/07/2003. FINDINGS: The cardiac silhouette, mediastinal and hilar contours are within normal limits given the AP projection of the film and the supine position of the patient. No mediastinal widening or apical pleural cap. The lungs are grossly clear. No pleural effusion or pneumothorax. I do not see any definite rib fractures. Remote healed right clavicle fracture is noted.  Could not exclude the possibility of a distal left clavicle fracture but recommend correlation with pain and tenderness in this area. IMPRESSION: No acute cardiopulmonary findings. Intact bony thorax. Could not exclude the possibility of a distal left clavicle fracture. Electronically Signed: By: Kalman Jewels M.D. On: 03/04/2014 14:04   Dg Pelvis Comp Min 3v  03/05/2014 CLINICAL DATA: Fall off roof yesterday, pelvic fracture, left side pain EXAM: JUDET PELVIS - 3+ VIEW COMPARISON: 03/04/2014 FINDINGS: Three views of the pelvis submitted including Judet view. Again noted Mild displaced comminuted fracture of the left iliac wing. There is mild displaced fracture of left inferior pubic ramus. Nondisplaced fracture of the left superior pubic ramus. Again noted a transverse fracture line in left iliac bone progressing to left SI joint. IMPRESSION: Again noted Mild displaced post traumatic comminuted fracture of the left iliac wing. There is mild displaced fracture of left inferior pubic ramus. Nondisplaced fracture of the left superior pubic ramus. Again noted a transverse fracture line in left iliac bone progressing to left SI joint. Electronically Signed By: Lahoma Crocker M.D. On: 03/05/2014 09:46   Dg Hand 2 View Right  03/04/2014 CLINICAL DATA: Pt fell off of a ladder today and has pain in the 2nd digit in the right hand that travels through the hand EXAM: RIGHT HAND - 2 VIEW COMPARISON: None. FINDINGS: No acute fracture. No dislocation. There is a well corticated bone fragment medial to the PIP joint of the fourth finger. This has a chronic appearance. There are minor osteoarthritic changes with small marginal osteophytes and slight asymmetric joint space narrowing of several of the interphalangeal joints. Mild arthropathic changes  are noted of the second and third metacarpophalangeal joints. Soft tissues are unremarkable. No radiopaque foreign body. IMPRESSION: No fracture or  dislocation or acute finding. Electronically Signed By: Lajean Manes M.D. On: 03/04/2014 16:35   Ct 3d Independent Darreld Mclean  03/04/2014 CLINICAL DATA: Nonspecific (abnormal) findings on radiological and other examination of musculoskeletal sysem. Patient fell off roof, pelvic fractures EXAM: 3-DIMENSIONAL CT IMAGE RENDERING ON INDEPENDENT WORKSTATION TECHNIQUE: 3-dimensional CT images were rendered by post-processing of the original CT data on an independent workstation. The 3-dimensional CT images were interpreted and findings were reported in the accompanying complete CT report for this study COMPARISON: CT chest abdomen and pelvis 03/04/2014 FINDINGS: Comminuted mildly displaced fracture involving the LEFT iliac wing, with fracture planes coursing superior and anterior to the acetabulum without discrete acetabular involvement. An additional oblique fracture plane extends through superior LEFT iliac wing into LEFT SI joint. Nondisplaced fracture LEFT superior pubic ramus. Minimally displaced fracture inferior LEFT pubic ramus. Hips normally located bilaterally. SI joints appear symmetric. Subtle anterior LEFT sacral fracture on axial images is not definitely visualized on the 3D reconstructions. RIGHT pelvis appears intact. L5 vertebra appears intact and normally aligned. IMPRESSION: Multiple fracture planes involving the LEFT iliac wing remaining external to the LEFT acetabulum but extending in a nondisplaced fracture plane into the LEFT SI joint. Fractures of LEFT superior and inferior pubic rami. Electronically Signed By: Lavonia Dana M.D. On: 03/04/2014 18:58   Dg Chest Port 1 View  03/05/2014 CLINICAL DATA: 58 year old male with left pneumothorax status post fall from roof. Initial encounter. EXAM: PORTABLE CHEST - 1 VIEW COMPARISON: Chest CT 03/04/2014 and earlier. FINDINGS: Portable AP semi upright view at 0536 hrs. Small left pneumothorax has not significantly  changed. Minimally displaced distal left clavicle fracture re- identified. Lower lung volumes. Stable cardiac size and mediastinal contours. No pulmonary edema, pleural effusion or pulmonary contusion identified. Visualized tracheal air column is within normal limits. IMPRESSION: 1. Stable small left pneumothorax. 2. No new cardiopulmonary abnormality. Electronically Signed By: Lars Pinks M.D. On: 03/05/2014 07:14   Dg Chest Port 1 View  03/04/2014 ADDENDUM REPORT: 03/04/2014 14:54 ADDENDUM: Small left apical pneumothorax is suspected. Electronically Signed By: Kalman Jewels M.D. On: 03/04/2014 14:54   03/04/2014 CLINICAL DATA: Golden Circle off a roof. EXAM: PORTABLE CHEST - 1 VIEW; PORTABLE PELVIS 1-2 VIEWS COMPARISON: 06/07/2003. FINDINGS: The cardiac silhouette, mediastinal and hilar contours are within normal limits given the AP projection of the film and the supine position of the patient. No mediastinal widening or apical pleural cap. The lungs are grossly clear. No pleural effusion or pneumothorax. I do not see any definite rib fractures. Remote healed right clavicle fracture is noted. Could not exclude the possibility of a distal left clavicle fracture but recommend correlation with pain and tenderness in this area. IMPRESSION: No acute cardiopulmonary findings. Intact bony thorax. Could not exclude the possibility of a distal left clavicle fracture. Electronically Signed: By: Kalman Jewels M.D. On: 03/04/2014 14:04     Assessment/Plan: Diagnosis: multiple pelvic fractures/polytrauma after fall 1. Does the need for close, 24 hr/day medical supervision in concert with the patient's rehab needs make it unreasonable for this patient to be served in a less intensive setting? Yes 2. Co-Morbidities requiring supervision/potential complications: Pain mgt 3. Due to bladder management, bowel management, safety, skin/wound care, disease management, medication administration, pain  management and patient education, does the patient require 24 hr/day rehab nursing? Yes 4. Does the patient require coordinated care of a physician, rehab nurse, PT (1-2  hrs/day, 5 days/week) and OT (1-2 hrs/day, 5 days/week) to address physical and functional deficits in the context of the above medical diagnosis(es)? Yes Addressing deficits in the following areas: balance, endurance, locomotion, strength, transferring, bowel/bladder control, bathing, dressing, feeding, grooming and toileting 5. Can the patient actively participate in an intensive therapy program of at least 3 hrs of therapy per day at least 5 days per week? Yes 6. The potential for patient to make measurable gains while on inpatient rehab is excellent 7. Anticipated functional outcomes upon discharge from inpatient rehab are modified independent, supervision and min assist with PT, modified independent, supervision and min assist with OT, n/a with SLP. 8. Estimated rehab length of stay to reach the above functional goals is: potentially 8-14 days 9. Does the patient have adequate social supports and living environment to accommodate these discharge functional goals? Yes 10. Anticipated D/C setting: Home 11. Anticipated post D/C treatments: [place] therapy 12. Overall Rehab/Functional Prognosis: excellent  RECOMMENDATIONS: This patient's condition is appropriate for continued rehabilitative care in the following setting: CIR Patient has agreed to participate in recommended program. Potentially Note that insurance prior authorization may be required for reimbursement for recommended care.  Comment: Therapy evaluations pending. Rehab Admissions Coordinator to follow up.  Thanks,  Meredith Staggers, MD, Mellody Drown     03/06/2014

## 2014-03-10 NOTE — Progress Notes (Signed)
PMR Admission Coordinator Pre-Admission Assessment  Patient: Roger Wilson is an 58 y.o., male MRN: [id number] DOB: 10-May-1955 Height: 5\' 11"  (180.3 cm) Weight: 80.831 kg (178 lb 3.2 oz)  Insurance Information HMO: PPO: Yes PCP: IPA: 80/20: OTHER: Group # [id number] PRIMARY: [employer] of [place] Policy#: [id number] Subscriber: Henrene Dodge CM Name: Any RN Phone#: [phone] Fax#: [phone] Pre-Cert#: [id number] from 11/21 to 11/27 with update due 03/14/14 Employer: Works FT Benefits: Phone #: [phone] Name: Elton Sin. Date: 05/18/13 Deduct: $5000 ($3361.86 remains) Out of Pocket Max: $5000 ($3250.17 remains) Life Max: unlimited CIR: 80% w/auth SNF: 80% w/auth Outpatient: 80% Co-Pay: 20% Home Health: 80% Co-Pay: 20% DME: 80% Co-Pay: 20% Providers: in network  Emergency [place]    Name Relation Home Work Mobile   [place] Spouse [phone]       Current Medical History  Patient Admitting Diagnosis: Multiple pelvic fractures/polytrauma after fall  History of Present Illness: A 58 y.o. right handed male admitted 03/04/2014 after a fall from a roof approximately 10 feet with questionable loss of consciousness. Patient was independent prior to admission living with his wife. Complaints of left shoulder pain, chest pain and left hip pain. Cranial CT scan showed a high left parietal scalp hematoma without skull fracture or intracranial abnormality. CT abdomen and pelvis showed a left iliac wing fracture with left superior and inferior pubic rami fracture as well as retroperitoneal hematoma, left small pneumothorax, left first rib fracture and left clavicle fracture. Follow-up orthopedic services Dr. Marcelino Scot and currently  weightbearing as tolerated left lower extremity and shoulder sling for left distal clavicle fracture question weightbearing left upper extremity. CT 3-D of pelvis shows multiple fractures involving left iliac wing remaining external to the left acetabulum extending in a nondisplaced fracture plane into the left SI joint as well as fractures left superior and inferior pubic rami. Hospital course pain management. Subcutaneous Lovenox added for DVT prophylaxis. Bouts of urinary retention with foley catheter in place and currently maintained on urecholine. Physical therapy evaluation completed with recommendations of physical medicine rehabilitation consult.. Patient to be admitted for comprehensive inpatient rehabilitation program.    Past Medical History  Past Medical History  Diagnosis Date  . Environmental allergies   . Hypertension     Family History  family history is negative for Colon cancer and Stomach cancer.  Prior Rehab/Hospitalizations: Had outpatient therapy 2 yrs ago after ankle fx with [facility] orthopedics.  Current Medications  Current facility-administered medications: 0.9 % NaCl with KCl 20 mEq/ L infusion, , Intravenous, Continuous, Doreen Salvage, MD, Last Rate: 50 mL/hr at 03/07/14 1240; bethanechol (URECHOLINE) tablet 25 mg, 25 mg, Oral, TID, Georganna Skeans, MD, 25 mg at 03/07/14 0913; bisacodyl (DULCOLAX) suppository 10 mg, 10 mg, Rectal, Daily PRN, Megan N Dort, PA-C docusate sodium (COLACE) capsule 100 mg, 100 mg, Oral, BID, Megan N Dort, PA-C, 100 mg at 03/07/14 0914; enoxaparin (LOVENOX) injection 40 mg, 40 mg, Subcutaneous, Daily, Georganna Skeans, MD, 40 mg at 03/07/14 0914; HYDROmorphone (DILAUDID) injection 0.5-1 mg, 0.5-1 mg, Intravenous, Q3H PRN, Megan N Dort, PA-C; ketorolac (TORADOL) 15 MG/ML injection 15 mg, 15 mg, Intravenous, 3 times per day, Georganna Skeans, MD, 15 mg at 03/07/14 1445 loratadine (CLARITIN) tablet 10 mg, 10 mg, Oral, Daily, Doreen Salvage, MD, 10 mg at 03/07/14 0913; methocarbamol (ROBAXIN) tablet 1,000 mg, 1,000 mg, Oral, TID, Megan N Dort, PA-C, 1,000 mg at 03/07/14 0913; ondansetron (ZOFRAN) injection 4 mg, 4 mg, Intravenous, Q6H PRN, Megan N Dort, PA-C, 4 mg  at 03/04/14 1511; oxyCODONE (Oxy IR/ROXICODONE) immediate release tablet 10 mg, 10 mg, Oral, Q4H PRN, Doreen Salvage, MD, 10 mg at 03/07/14 0913 pantoprazole (PROTONIX) EC tablet 40 mg, 40 mg, Oral, Daily, 40 mg at 03/07/14 0913 **OR** [DISCONTINUED] pantoprazole (PROTONIX) injection 40 mg, 40 mg, Intravenous, Daily, Megan N Dort, PA-C; tamsulosin (FLOMAX) capsule 0.4 mg, 0.4 mg, Oral, Daily, Georganna Skeans, MD, 0.4 mg at 03/07/14 3710  Patients Current Diet: Diet regular  Precautions / Restrictions Precautions Precautions: Fall Restrictions Weight Bearing Restrictions: No   Prior Activity Level Community (5-7x/wk): Went out daily. 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   Home Assistive Devices / Equipment Home Assistive Devices/Equipment: Eyeglasses  Prior Functional Level Prior Function Level of Independence: Independent Comments: pt works as Programme researcher, broadcasting/film/video.  Current Functional Level Cognition  Overall Cognitive Status: Within Functional Limits for tasks assessed Orientation Level: Oriented X4   Extremity Assessment (includes Sensation/Coordination)  Upper Extremity Assessment: Defer to OT evaluation Lower Extremity Assessment: LLE deficits/detail LLE Deficits / Details: Limited by pain in L hip.  Cervical / Trunk Assessment: Normal   ADLs  Overall ADL's : Needs assistance/impaired Eating/Feeding: Sitting, Set up Eating/Feeding Details (indicate cue type and reason): needs (A) with opening tight containers due to LUE shoulder pain  Grooming: Oral care, Wash/dry face, Min guard, Standing Grooming Details (indicate cue type and reason): pt is able to remove 1 hand from RW during static standing to perform oral care and wash face  with cloth Upper Body Bathing: Sitting, Min guard Upper Body Bathing Details (indicate cue type and reason): pt able to complete without assistance including underneath both arms, though could need assistance if increased LUE pain to reach across to RUE Lower Body Bathing: Sit to/from stand, Moderate assistance Lower Body Bathing Details (indicate cue type and reason): (A) for Bil LEs from knees down due to pt inability to lean forward to reach (limited by hip pain). Pt able to wash groin and buttocks in standing.  Upper Body Dressing : Min guard, Sitting Lower Body Dressing: Maximal assistance, Sit to/from stand Lower Body Dressing Details (indicate cue type and reason): (A) to thread Bil LEs and for socks and shoes.  Toilet Transfer: Min guard, Stand-pivot, RW (bed>recliner ) Toileting- Water quality scientist and Hygiene: Min guard, Sit to/from stand Toileting - Clothing Manipulation Details (indicate cue type and reason): pt has foley cath, however demonstrated ability to perform pericare in standing during LB bathing. Pt can use UEs to manage clothing.  Functional mobility during ADLs: Min guard, Rolling walker General ADL Comments: Pt with improved pain control due to premedication and able to participate fully in PT/OT session. Pt ambulated in room with +2 for safety, however only required Min guard assist. Pt performed sponge bath with (A) for Bil LEs and performed pericare in standing. Pt would be an excellent CIR candidate, however, due to limitations of mobility and home setup.     Mobility  Overal bed mobility: Needs Assistance, +2 for physical assistance Bed Mobility: Rolling, Sidelying to Sit Rolling: Supervision Sidelying to sit: HOB elevated, Max assist, +2 for physical assistance Supine to sit: Max assist, +2 for physical assistance, HOB elevated General bed mobility comments: Pt able to mobilize BLEs to EOB with increased time and cues. Pt given belt to assist moving LLE to  EOB, required Min A to assist with LLE movement off bed. Max A with trunk to get to seated position.    Transfers  Overall transfer level: Needs assistance Equipment  used: Rolling walker (2 wheeled) Transfers: Sit to/from Stand Sit to Stand: Min guard Stand pivot transfers: Max assist, +2 physical assistance General transfer comment: Min guard for safety/balance. VC's for hand placement and technique. Cues to reach back upon descent into chair.    Ambulation / Gait / Stairs / Wheelchair Mobility  Ambulation/Gait Ambulation/Gait assistance: Physicist, medical (Feet): 20 Feet Assistive device: Rolling walker (2 wheeled) Gait Pattern/deviations: Step-to pattern, Decreased stride length, Decreased stance time - left, Decreased step length - right, Trunk flexed Gait velocity: slow Gait velocity interpretation: Below normal speed for age/gender General Gait Details: Vc's for increased WB through BUEs to help relieve pain through LLE with WB. VC's for upright posture.     Posture / Balance Dynamic Sitting Balance Sitting balance - Comments: Able to sit EOB without UE support. Able to scoot along side bed x2 for better positioning without LOB.    Special needs/care consideration BiPAP/CPAP No CPM No Continuous Drip IV 0.9% NS with KCL 20 meq/L 50 ml/hr Dialysis No  Life Vest No Oxygen Not at home. Currently on O2 since on Dilaudid PCA Special Bed No Trach Size No Wound Vac (area) No  Skin Has scalp hematoma; sling to L arm when up; bruises on left chest.  Bowel mgmt: No BM since 03/04/14 Bladder mgmt: Foley catheter for urinary retention Diabetic mgmt No    Previous Home Environment Living Arrangements: Spouse/significant other Home Care Services: No  Discharge Living Setting Plans for Discharge Living Setting: Patient's home, House, Lives with (comment) (Lives with wife.) Type of Home at Discharge: House Discharge  Home Layout: One level (1 step up from sunroom to main area of house.) Discharge Home Access: Stairs to enter Entrance Stairs-Number of Steps: 3-4 steps at back and front entry Does the patient have any problems obtaining your medications?: No  Social/Family/Support Systems Patient Roles: Spouse, Parent (Has a wife, daughter and 2 brothers.) Contact Information: Tomaz Janis - wife [phone] Anticipated Caregiver: self, wife and brothers potentially Ability/Limitations of Caregiver: Wife works 8:30 to 6 pm or later. Wife can work some from home. Has 2 brothers who may be able to assist while wife works. Caregiver Availability: Other (Comment) (Will not have 24/7 assistance.) Discharge Plan Discussed with Primary Caregiver: Yes Is Caregiver In Agreement with Plan?: Yes Does Caregiver/Family have Issues with Lodging/Transportation while Pt is in Rehab?: No  Goals/Additional Needs Patient/Family Goal for Rehab: PT/OT mod I to min assist goals Expected length of stay: 8-14 days Cultural Considerations: Attends church on Wed and Sun at [place] Needs: Regular diet, thin liquids Equipment Needs: TBD Pt/Family Agrees to Admission and willing to participate: Yes Program Orientation Provided & Reviewed with Pt/Caregiver Including Roles & Responsibilities: Yes  Decrease burden of Care through IP rehab admission: N/A  Possible need for SNF placement upon discharge: Not planned  Patient Condition: This patient's condition remains as documented in the consult dated 03/06/14, in which the Rehabilitation Physician determined and documented that the patient's condition is appropriate for intensive rehabilitative care in an inpatient rehabilitation facility. Will admit to inpatient rehab tomorrow, Saturday, 03/08/14.  Preadmission Screen Completed By: Retta Diones, 03/07/2014 3:49 PM ______________________________________________________________________  Discussed status with  Dr. Naaman Plummer on 03/07/14 at 1417 and received telephone approval for admission tomorrow, Saturday, 03/08/14.  Admission Coordinator: Retta Diones, time1549/Date11/20/15          Cosigned by: Meredith Staggers, MD at 03/07/2014 8:25 PM

## 2014-03-10 NOTE — Progress Notes (Signed)
Subjective/Complaints: 58 y.o. right handed male admitted 03/04/2014 after a fall from a roof approximately 10 feet with questionable loss of consciousness. Patient was independent prior to admission living with his wife. Complaints of left shoulder pain chest pain and left hip pain. Cranial CT scan showed a high left parietal scalp hematoma without skull fracture or intracranial abnormality. CT abdomen and pelvis showed a left iliac wing fracture with left superior and inferior pubic rami fracture as well as retroperitoneal hematoma, left small pneumothorax, left first rib fracture and left clavicle fracture. Follow-up orthopedic services Dr. Marcelino Scot and currently weightbearing as tolerated left lower extremity and shoulder sling for left distal clavicle fracture question weightbearing left upper extremity. CT 3-D of pelvis shows multiple fractures involving left iliac wing remaining external to the left acetabulum extending in a nondisplaced fracture plane into the left SI joint as well as fractures left superior and inferior pubic rami. Hospital course pain management. Subcutaneous Lovenox added for DVT prophylaxis. Bouts of urinary retention and monitored his mobility improves currently maintained on urecholine  Objective: Vital Signs: Blood pressure 142/75, pulse 67, temperature 98.1 F (36.7 C), temperature source Oral, resp. rate 17, height $RemoveBe'5\' 11"'IqbMdnZAt$  (1.803 m), weight 83.915 kg (185 lb), SpO2 98 %. No results found. Results for orders placed or performed during the hospital encounter of 03/08/14 (from the past 72 hour(s))  CBC     Status: Abnormal   Collection Time: 03/08/14  3:08 PM  Result Value Ref Range   WBC 5.2 4.0 - 10.5 K/uL   RBC 4.15 (L) 4.22 - 5.81 MIL/uL   Hemoglobin 12.6 (L) 13.0 - 17.0 g/dL   HCT 35.3 (L) 39.0 - 52.0 %   MCV 85.1 78.0 - 100.0 fL   MCH 30.4 26.0 - 34.0 pg   MCHC 35.7 30.0 - 36.0 g/dL   RDW 12.2 11.5 - 15.5 %   Platelets 123 (L) 150 - 400 K/uL  Creatinine,  serum     Status: None   Collection Time: 03/08/14  3:08 PM  Result Value Ref Range   Creatinine, Ser 0.86 0.50 - 1.35 mg/dL   GFR calc non Af Amer >90 >90 mL/min   GFR calc Af Amer >90 >90 mL/min    Comment: (NOTE) The eGFR has been calculated using the CKD EPI equation. This calculation has not been validated in all clinical situations. eGFR's persistently <90 mL/min signify possible Chronic Kidney Disease.   CBC WITH DIFFERENTIAL     Status: Abnormal   Collection Time: 03/10/14  4:46 AM  Result Value Ref Range   WBC 5.8 4.0 - 10.5 K/uL   RBC 4.28 4.22 - 5.81 MIL/uL   Hemoglobin 12.7 (L) 13.0 - 17.0 g/dL   HCT 36.3 (L) 39.0 - 52.0 %   MCV 84.8 78.0 - 100.0 fL   MCH 29.7 26.0 - 34.0 pg   MCHC 35.0 30.0 - 36.0 g/dL   RDW 12.4 11.5 - 15.5 %   Platelets 153 150 - 400 K/uL   Neutrophils Relative % 50 43 - 77 %   Neutro Abs 2.9 1.7 - 7.7 K/uL   Lymphocytes Relative 23 12 - 46 %   Lymphs Abs 1.4 0.7 - 4.0 K/uL   Monocytes Relative 13 (H) 3 - 12 %   Monocytes Absolute 0.8 0.1 - 1.0 K/uL   Eosinophils Relative 14 (H) 0 - 5 %   Eosinophils Absolute 0.8 (H) 0.0 - 0.7 K/uL   Basophils Relative 0 0 - 1 %  Basophils Absolute 0.0 0.0 - 0.1 K/uL  Comprehensive metabolic panel     Status: Abnormal   Collection Time: 03/10/14  4:46 AM  Result Value Ref Range   Sodium 138 137 - 147 mEq/L   Potassium 4.4 3.7 - 5.3 mEq/L   Chloride 100 96 - 112 mEq/L   CO2 27 19 - 32 mEq/L   Glucose, Bld 99 70 - 99 mg/dL   BUN 13 6 - 23 mg/dL   Creatinine, Ser 0.76 0.50 - 1.35 mg/dL   Calcium 8.5 8.4 - 10.5 mg/dL   Total Protein 5.9 (L) 6.0 - 8.3 g/dL   Albumin 2.7 (L) 3.5 - 5.2 g/dL   AST 59 (H) 0 - 37 U/L   ALT 47 0 - 53 U/L   Alkaline Phosphatase 46 39 - 117 U/L   Total Bilirubin 0.9 0.3 - 1.2 mg/dL   GFR calc non Af Amer >90 >90 mL/min   GFR calc Af Amer >90 >90 mL/min    Comment: (NOTE) The eGFR has been calculated using the CKD EPI equation. This calculation has not been validated in all  clinical situations. eGFR's persistently <90 mL/min signify possible Chronic Kidney Disease.    Anion gap 11 5 - 15     HEENT: normal Cardio: RRR Resp: CTA B/L and unlabored GI: BS positive and NT, ND Extremity:  No Edema Skin:   Other pruritis buttocks Neuro: Alert/Oriented, Normal Motor on Right and Abnormal Motor 2- Left delt, 4- BI, tri, grip, 2- L HF, 3- KE, 4/5 L ank;e DF/PF Musc/Skel:  Extremity tender Left shoulder and Left groin Gen NAD, GU Foley   Assessment/Plan: 1. Functional deficits secondary to multitrauma after a fall from a ladder including left iliac wing fracture, left superior and inferior pubic rami fractures, left rib and clavicle fracture  which require 3+ hours per day of interdisciplinary therapy in a comprehensive inpatient rehab setting. Physiatrist is providing close team supervision and 24 hour management of active medical problems listed below. Physiatrist and rehab team continue to assess barriers to discharge/monitor patient progress toward functional and medical goals. FIM: FIM - Bathing Bathing Steps Patient Completed: Chest, Right Arm, Left Arm, Abdomen, Front perineal area, Buttocks, Right upper leg, Left upper leg Bathing: 4: Min-Patient completes 8-9 55f 10 parts or 75+ percent  FIM - Upper Body Dressing/Undressing Upper body dressing/undressing: 0: Activity did not occur FIM - Lower Body Dressing/Undressing Lower body dressing/undressing: 1: Total-Patient completed less than 25% of tasks (For gripper socks only, pt with no other clothing.)        FIM - Control and instrumentation engineer Devices: Adult nurse Transfer: 1: Supine > Sit: Total A (helper does all/Pt. < 25%), 3: Bed > Chair or W/C: Mod A (lift or lower assist)  FIM - Locomotion: Wheelchair Distance: 150 Locomotion: Wheelchair: 5: Travels 150 ft or more: maneuvers on rugs and over door sills with supervision, cueing or coaxing FIM - Locomotion:  Ambulation Locomotion: Ambulation Assistive Devices: Administrator Ambulation/Gait Assistance: 4: Min assist Locomotion: Ambulation: 1: Travels less than 50 ft with minimal assistance (Pt.>75%)  Comprehension Comprehension Mode: Auditory Comprehension: 6-Follows complex conversation/direction: With extra time/assistive device  Expression Expression Mode: Verbal Expression: 7-Expresses complex ideas: With no assist  Social Interaction Social Interaction: 7-Interacts appropriately with others - No medications needed.  Problem Solving Problem Solving: 6-Solves complex problems: With extra time  Memory Memory: 6-More than reasonable amt of time  Medical Problem List and Plan: 1. Functional deficits secondary  to multitrauma after a fall from a ladder including left iliac wing fracture, left superior and inferior pubic rami fractures, left rib and clavicle fracture . 2. DVT Prophylaxis/Anticoagulation: Subcutaneous Lovenox. Monitor platelet counts any signs of bleeding 3. Pain Management: Oxycodone and Robaxin as needed. May consider scheduled narcotics 4. Urinary retention. Continue your choline and Flomax as directed. Check PVRs 3. Monitor with increased mobility. Follow-up urine study 5. Neuropsych: This patient is capable of making decisions on his own behalf. 6. Skin/Wound Care: Routine skin checks 7. Fluids/Electrolytes/Nutrition: Strict I and O's. Follow-up chemistries. Provide nutritional supplements as needed  LOS (Days) 2 A FACE TO FACE EVALUATION WAS PERFORMED  Temekia Caskey E 03/10/2014, 7:27 AM

## 2014-03-10 NOTE — Plan of Care (Signed)
Problem: RH BOWEL ELIMINATION Goal: RH STG MANAGE BOWEL WITH ASSISTANCE STG Manage Bowel with min. Assistance.  Patient was unable to have a BM and needed a FLEETS enema.  Goal: RH STG MANAGE BOWEL W/MEDICATION W/ASSISTANCE STG Manage Bowel with Medication with min. Assistance.  FLEETS enema given   Problem: RH BLADDER ELIMINATION Goal: RH STG MANAGE BLADDER WITH MEDICATION WITH ASSISTANCE STG Manage Bladder With Medication With min. Assistance.  Foley cath in place Goal: RH STG MANAGE BLADDER WITH EQUIPMENT WITH ASSISTANCE STG Manage Bladder With Equipment With min. Assistance  Foley cath in place  Problem: RH SKIN INTEGRITY Goal: RH STG SKIN FREE OF INFECTION/BREAKDOWN With min.assist  Outcome: Progressing  Problem: RH SAFETY Goal: RH STG ADHERE TO SAFETY PRECAUTIONS W/ASSISTANCE/DEVICE STG Adhere to Safety Precautions With mod. Assistance/Device.  Outcome: Progressing Goal: RH STG DECREASED RISK OF FALL WITH ASSISTANCE STG Decreased Risk of Fall With Assistance.  Outcome: Progressing

## 2014-03-10 NOTE — Progress Notes (Signed)
Patient information reviewed and entered into eRehab system by Quintavious Rinck, RN, CRRN, PPS Coordinator.  Information including medical coding and functional independence measure will be reviewed and updated through discharge.    

## 2014-03-10 NOTE — Plan of Care (Signed)
Problem: RH BOWEL ELIMINATION Goal: RH STG MANAGE BOWEL WITH ASSISTANCE STG Manage Bowel with min. Assistance.  Outcome: Progressing Goal: RH STG MANAGE BOWEL W/MEDICATION W/ASSISTANCE STG Manage Bowel with Medication with min. Assistance.  Outcome: Progressing  Problem: RH BLADDER ELIMINATION Goal: RH STG MANAGE BLADDER WITH MEDICATION WITH ASSISTANCE STG Manage Bladder With Medication With min. Assistance.  Outcome: Progressing Goal: RH STG MANAGE BLADDER WITH EQUIPMENT WITH ASSISTANCE STG Manage Bladder With Equipment With min. Assistance  Outcome: Progressing  Problem: RH SKIN INTEGRITY Goal: RH STG SKIN FREE OF INFECTION/BREAKDOWN With min.assist  Outcome: Progressing Goal: RH STG MAINTAIN SKIN INTEGRITY WITH ASSISTANCE STG Maintain Skin Integrity With min.Assistance.  Outcome: Progressing  Problem: RH SAFETY Goal: RH STG ADHERE TO SAFETY PRECAUTIONS W/ASSISTANCE/DEVICE STG Adhere to Safety Precautions With mod. Assistance/Device.  Outcome: Progressing Goal: RH STG DECREASED RISK OF FALL WITH ASSISTANCE STG Decreased Risk of Fall With Assistance.  Outcome: Progressing Goal: RH STG DEMO UNDERSTANDING HOME SAFETY PRECAUTIONS Outcome: Progressing Goal: RH OTHER STG SAFETY GOALS W/ASSIST Other STG Safety Goals With Assistance.  Outcome: Progressing

## 2014-03-10 NOTE — Progress Notes (Signed)
Physical Therapy Session Note  Patient Details  Name: Roger Wilson MRN: [id number] Date of Birth: 07-Feb-1956  Today's Date: 03/10/2014 PT Individual Time: 1100-1200 PT Individual Time Calculation (min): 60 min   Short Term Goals: Week 1:  PT Short Term Goal 1 (Week 1): = LTG due to short LOS  Skilled Therapeutic Interventions/Progress Updates:   Pt reporting increased pain today; reports he discussed with the doctor having his pain medication scheduled.  Pt premedicated but still reporting 8/10 pain in L groin and lateral flank/hip; also reporting increased soreness in L shoulder due to WB through RW.  Pt agreeable to therapy but did not want to focus on bed mobility secondary to increased pain.  Performed w/c mobility to/from gym x 150' with supervision but extra time.  Pt requesting to warm up on Nustep.  Transferred w/c > Nustep stand pivot min A.  Pt set up and performed AAROM and strengthening on Nustep at level 5 x 8 minutes.  Pt ambulated 12' from Nustep to mat with min A and increased time still with decreased weight shift and WB through LLE.  Continued stair negotiation training up/down 3 stairs with 2 rails and min A with min verbal cues for safe sequencing.  Pt still demonstrating use of L hip hike to advance LLE.  Performed standing LLE hip and knee flexion training with foot taps to 2" step with UE support on RW; pt required mod A for AAROM to initiate and maintain hip flexion with knee flexion to fully clear foot.  Pt began to c/o spasm in L flank; requested to sit and rest.  During sitting rest break pt performed 2 sets x 30 seconds R lateral lean to elbow for L trunk stretch/lengthening.  Continued gait training with R and L lateral stepping x 10' to L and then to R' with min A and verbal cues for safe sequencing.  Returned to room in w/c and ice pack placed on L hip/flank for pain management.     Therapy Documentation Precautions:  Precautions Precautions: Fall Required Braces  or Orthoses: Sling (sling for comfort? no formal order regarding wearing of sling or weightbearing status in the LUE) Restrictions Weight Bearing Restrictions: Yes LUE Weight Bearing: Weight bearing as tolerated LLE Weight Bearing: Weight bearing as tolerated Pain: Pain Assessment Pain Assessment: 0-10 Pain Score: 8  Pain Type: Acute pain Pain Location: Hip Pain Orientation: Left Pain Descriptors / Indicators: Cramping Pain Frequency: Intermittent Pain Onset: On-going Patients Stated Pain Goal: 2 Pain Intervention(s): Cold applied;Rest;Therapeutic touch Multiple Pain Sites: No Locomotion : Ambulation Ambulation/Gait Assistance: 4: Min assist Wheelchair Mobility Distance: 150   See FIM for current functional status  Therapy/Group: Individual Therapy  [name] 03/10/2014, 1:32 PM

## 2014-03-10 NOTE — Progress Notes (Signed)
Pt. Complained of some discomfort around his left flank,VS with normal range,Dan PAC was notified and come to assess the pt. Keep monitoring pt. Closely and assessing his needs.

## 2014-03-11 ENCOUNTER — Inpatient Hospital Stay (HOSPITAL_COMMUNITY): Payer: BC Managed Care – PPO | Admitting: Rehabilitation

## 2014-03-11 ENCOUNTER — Inpatient Hospital Stay (HOSPITAL_COMMUNITY): Payer: BC Managed Care – PPO

## 2014-03-11 DIAGNOSIS — S32502D Unspecified fracture of left pubis, subsequent encounter for fracture with routine healing: Secondary | ICD-10-CM

## 2014-03-11 DIAGNOSIS — S32302D Unspecified fracture of left ilium, subsequent encounter for fracture with routine healing: Principal | ICD-10-CM

## 2014-03-11 DIAGNOSIS — K5901 Slow transit constipation: Secondary | ICD-10-CM

## 2014-03-11 MED ORDER — SENNOSIDES-DOCUSATE SODIUM 8.6-50 MG PO TABS
3.0000 | ORAL_TABLET | Freq: Two times a day (BID) | ORAL | Status: AC
  Administered 2014-03-11 (×2): 3 via ORAL
  Filled 2014-03-11 (×2): qty 3

## 2014-03-11 NOTE — IPOC Note (Signed)
Overall Plan of Care [facility]) Patient Details Name: Roger Wilson MRN: [id number] DOB: February 17, 1956  Admitting Diagnosis: multiple fracture  Hospital Problems: Active Problems:   Left rib fracture   Fracture of left iliac wing   Closed fracture of ramus of left pubis   Trauma     Functional Problem List: Nursing Bladder, Bowel, Pain, Safety, Skin Integrity  PT Balance, Endurance, Motor, Pain  OT Balance, Pain  SLP    TR         Basic ADL's: OT Grooming, Bathing, Dressing, Toileting     Advanced  ADL's: OT Simple Meal Preparation     Transfers: PT Bed Mobility, Bed to Chair, Car, Manufacturing systems engineer, Metallurgist: PT Ambulation, Emergency planning/management officer, Stairs     Additional Impairments: OT Fuctional Use of Upper Extremity  SLP        TR      Anticipated Outcomes Item Anticipated Outcome  Self Feeding independent  Swallowing      Basic self-care  modified independent to supervision  Toileting  modified independent   Bathroom Transfers modified independent  Bowel/Bladder  Continent to bladder and bowel.Foley catheter in for retention.  Transfers  Mod I  Locomotion  Mod I with RW   Communication     Cognition     Pain  Less than 3 on scale 1 to 10  Safety/Judgment  Pt. will be free from fall during stay in rehab.   Therapy Plan: PT Intensity: Minimum of 1-2 x/day ,45 to 90 minutes PT Frequency: 5 out of 7 days PT Duration Estimated Length of Stay: 7-9 days OT Intensity: Minimum of 1-2 x/day, 45 to 90 minutes OT Frequency: 5 out of 7 days OT Duration/Estimated Length of Stay: 7-9 days         Team Interventions: Nursing Interventions Patient/Family Education, Bladder Management, Bowel Management, Pain Management  PT interventions Ambulation/gait training, Training and development officer, Community reintegration, Discharge planning, DME/adaptive equipment instruction, Functional mobility training, Neuromuscular re-education, Pain  management, Patient/family education, Stair training, Therapeutic Activities, Therapeutic Exercise, UE/LE Strength taining/ROM, Wheelchair propulsion/positioning  OT Interventions Training and development officer, DME/adaptive equipment instruction, Neuromuscular re-education, Self Care/advanced ADL retraining, Therapeutic Exercise, Patient/family education, Discharge planning, Therapeutic Activities, Psychosocial support, Functional mobility training, Community reintegration, Pain management  SLP Interventions    TR Interventions    SW/CM Interventions Discharge Planning, Barrister's clerk, Patient/Family Education    Team Discharge Planning: Destination: PT-Home ,OT- Home , SLP-  Projected Follow-up: PT-Home health PT, OT-  None, SLP-  Projected Equipment Needs: PT-Wheelchair (measurements), Wheelchair cushion (measurements), Rolling walker with 5" wheels, OT- 3 in 1 bedside comode, SLP-  Equipment Details: PT- , OT-  Patient/family involved in discharge planning: PT- Patient,  OT-Patient, SLP-   MD ELOS: 12-16d Medical Rehab Prognosis:  Good Assessment: 58 y.o. right handed male admitted 03/04/2014 after a fall from a roof approximately 10 feet with questionable loss of consciousness. Patient was independent prior to admission living with his wife. Complaints of left shoulder pain chest pain and left hip pain. Cranial CT scan showed a high left parietal scalp hematoma without skull fracture or intracranial abnormality. CT abdomen and pelvis showed a left iliac wing fracture with left superior and inferior pubic rami fracture as well as retroperitoneal hematoma, left small pneumothorax, left first rib fracture and left clavicle fracture. Follow-up orthopedic services Dr. Marcelino Scot and currently weightbearing as tolerated left lower extremity and shoulder sling for left distal clavicle fracture question weightbearing left upper extremity. CT  3-D of pelvis shows multiple fractures involving left iliac wing  remaining external to the left acetabulum extending in a nondisplaced fracture plane into the left SI joint as well as fractures left superior and inferior pubic rami   Now requiring 24/7 Rehab RN,MD, as well as CIR level PT, OT and SLP.  Treatment team will focus on ADLs and mobility with goals set at Mod I  See Team Conference Notes for weekly updates to the plan of care

## 2014-03-11 NOTE — Progress Notes (Signed)
Occupational Therapy Session Note  Patient Details  Name: Roger Wilson MRN: [id number] Date of Birth: 1955-08-12  Today's Date: 03/11/2014 OT Individual Time: 1025-8527 OT Individual Time Calculation (min): 60 min    Short Term Goals: Week 1:  OT Short Term Goal 1 (Week 1): STGs equal to LTGs set at modified independent to supervision based on ELOS.  Skilled Therapeutic Interventions/Progress Updates: ADL-retraining with focus on improved functional mobility, adaptive pain tolerance, AE training (reacher, LH shoe horn, elastic shoe laces).   Pt received seated on BSC over toilet, completing toilet hygiene and transfer with only supervision assist.   Pt reported moderate pain with MD present and was re-educated on injury and physiology of pain symptoms by MD.    Pt progressed from toilet to seated shower and performed bathing, sitting and standing using LH sponge, grab bars and bath bench with min assist for thoroughness at buttocks.   Pt completed bath and ambulated to w/c to dress after min assist to apply lotion to buttocks d/t heat rash.   Pt educated on use of elastic shoe laces and LH shoe horn during this session.   Pain diminished to 6/10 after activity.   Pt left in w/c at end of session with all needs within reach.    Therapy Documentation Precautions:  Precautions Precautions: Fall Required Braces or Orthoses: Sling (sling for comfort? no formal order regarding wearing of sling or weightbearing status in the LUE) Restrictions Weight Bearing Restrictions: Yes LUE Weight Bearing: Weight bearing as tolerated LLE Weight Bearing: Weight bearing as tolerated  Vital Signs: Therapy Vitals Temp: 98.4 F (36.9 C) Temp Source: Oral Pulse Rate: 70 Resp: 17 BP: (!) 144/77 mmHg Patient Position (if appropriate): Sitting Oxygen Therapy SpO2: 94 % O2 Device: Not Delivered  Pain: Pain Assessment Pain Assessment: 0-10 Pain Score: 8  Pain Type: Acute pain Pain Location: Hip Pain  Orientation: Left Pain Descriptors / Indicators: Aching Pain Frequency: Intermittent Pain Onset: On-going Patients Stated Pain Goal: 2 Pain Intervention(s): Medication (See eMAR)  See FIM for current functional status  Therapy/Group: Individual Therapy  [place] 03/11/2014, 8:50 AM

## 2014-03-11 NOTE — Progress Notes (Signed)
Subjective/Complaints: 58 y.o. right handed male admitted 03/04/2014 after a fall from a roof approximately 10 feet with questionable loss of consciousness. Patient was independent prior to admission living with his wife. Complaints of left shoulder pain chest pain and left hip pain. Cranial CT scan showed a high left parietal scalp hematoma without skull fracture or intracranial abnormality. CT abdomen and pelvis showed a left iliac wing fracture with left superior and inferior pubic rami fracture as well as retroperitoneal hematoma, left small pneumothorax, left first rib fracture and left clavicle fracture. Follow-up orthopedic services Dr. Marcelino Scot and currently weightbearing as tolerated left lower extremity and shoulder sling for left distal clavicle fracture question weightbearing left upper extremity. CT 3-D of pelvis shows multiple fractures involving left iliac wing remaining external to the left acetabulum extending in a nondisplaced fracture plane into the left SI joint as well as fractures left superior and inferior pubic rami. Hospital course pain management. Subcutaneous Lovenox added for DVT prophylaxis. Bouts of urinary retention and monitored his mobility improves currently maintained on urecholine  Has pain with movement, standing, discussed muscle attachments to bone fragments Slept better, more comfortable last noc Objective: Vital Signs: Blood pressure 144/77, pulse 70, temperature 98.4 F (36.9 C), temperature source Oral, resp. rate 17, height $RemoveBe'5\' 11"'gQhPLLTyd$  (1.803 m), weight 83.915 kg (185 lb), SpO2 94 %. No results found. Results for orders placed or performed during the hospital encounter of 03/08/14 (from the past 72 hour(s))  CBC     Status: Abnormal   Collection Time: 03/08/14  3:08 PM  Result Value Ref Range   WBC 5.2 4.0 - 10.5 K/uL   RBC 4.15 (L) 4.22 - 5.81 MIL/uL   Hemoglobin 12.6 (L) 13.0 - 17.0 g/dL   HCT 35.3 (L) 39.0 - 52.0 %   MCV 85.1 78.0 - 100.0 fL   MCH 30.4  26.0 - 34.0 pg   MCHC 35.7 30.0 - 36.0 g/dL   RDW 12.2 11.5 - 15.5 %   Platelets 123 (L) 150 - 400 K/uL  Creatinine, serum     Status: None   Collection Time: 03/08/14  3:08 PM  Result Value Ref Range   Creatinine, Ser 0.86 0.50 - 1.35 mg/dL   GFR calc non Af Amer >90 >90 mL/min   GFR calc Af Amer >90 >90 mL/min    Comment: (NOTE) The eGFR has been calculated using the CKD EPI equation. This calculation has not been validated in all clinical situations. eGFR's persistently <90 mL/min signify possible Chronic Kidney Disease.   CBC WITH DIFFERENTIAL     Status: Abnormal   Collection Time: 03/10/14  4:46 AM  Result Value Ref Range   WBC 5.8 4.0 - 10.5 K/uL   RBC 4.28 4.22 - 5.81 MIL/uL   Hemoglobin 12.7 (L) 13.0 - 17.0 g/dL   HCT 36.3 (L) 39.0 - 52.0 %   MCV 84.8 78.0 - 100.0 fL   MCH 29.7 26.0 - 34.0 pg   MCHC 35.0 30.0 - 36.0 g/dL   RDW 12.4 11.5 - 15.5 %   Platelets 153 150 - 400 K/uL   Neutrophils Relative % 50 43 - 77 %   Neutro Abs 2.9 1.7 - 7.7 K/uL   Lymphocytes Relative 23 12 - 46 %   Lymphs Abs 1.4 0.7 - 4.0 K/uL   Monocytes Relative 13 (H) 3 - 12 %   Monocytes Absolute 0.8 0.1 - 1.0 K/uL   Eosinophils Relative 14 (H) 0 - 5 %   Eosinophils  Absolute 0.8 (H) 0.0 - 0.7 K/uL   Basophils Relative 0 0 - 1 %   Basophils Absolute 0.0 0.0 - 0.1 K/uL  Comprehensive metabolic panel     Status: Abnormal   Collection Time: 03/10/14  4:46 AM  Result Value Ref Range   Sodium 138 137 - 147 mEq/L   Potassium 4.4 3.7 - 5.3 mEq/L   Chloride 100 96 - 112 mEq/L   CO2 27 19 - 32 mEq/L   Glucose, Bld 99 70 - 99 mg/dL   BUN 13 6 - 23 mg/dL   Creatinine, Ser 0.76 0.50 - 1.35 mg/dL   Calcium 8.5 8.4 - 10.5 mg/dL   Total Protein 5.9 (L) 6.0 - 8.3 g/dL   Albumin 2.7 (L) 3.5 - 5.2 g/dL   AST 59 (H) 0 - 37 U/L   ALT 47 0 - 53 U/L   Alkaline Phosphatase 46 39 - 117 U/L   Total Bilirubin 0.9 0.3 - 1.2 mg/dL   GFR calc non Af Amer >90 >90 mL/min   GFR calc Af Amer >90 >90 mL/min     Comment: (NOTE) The eGFR has been calculated using the CKD EPI equation. This calculation has not been validated in all clinical situations. eGFR's persistently <90 mL/min signify possible Chronic Kidney Disease.    Anion gap 11 5 - 15     HEENT: normal Cardio: RRR Resp: CTA B/L and unlabored GI: BS positive and NT, ND Extremity:  No Edema Skin:   Other pruritis buttocks Neuro: Alert/Oriented, Normal Motor on Right and Abnormal Motor 2- Left delt, 4- BI, tri, grip, 2- L HF, 3- KE, 4/5 L ank;e DF/PF Musc/Skel:  Extremity tender Left shoulder and Left groin Gen NAD, GU Foley   Assessment/Plan: 1. Functional deficits secondary to multitrauma after a fall from a ladder including left iliac wing fracture, left superior and inferior pubic rami fractures, left rib and clavicle fracture  which require 3+ hours per day of interdisciplinary therapy in a comprehensive inpatient rehab setting. Physiatrist is providing close team supervision and 24 hour management of active medical problems listed below. Physiatrist and rehab team continue to assess barriers to discharge/monitor patient progress toward functional and medical goals. FIM: FIM - Bathing Bathing Steps Patient Completed: Chest, Right Arm, Left Arm, Abdomen, Front perineal area, Buttocks, Right upper leg, Left upper leg, Right lower leg (including foot), Left lower leg (including foot) Bathing: 5: Supervision: Safety issues/verbal cues (LH sponge)  FIM - Upper Body Dressing/Undressing Upper body dressing/undressing steps patient completed: Thread/unthread right sleeve of pullover shirt/dresss, Thread/unthread left sleeve of pullover shirt/dress, Put head through opening of pull over shirt/dress, Pull shirt over trunk Upper body dressing/undressing: 5: Set-up assist to: Obtain clothing/put away FIM - Lower Body Dressing/Undressing Lower body dressing/undressing steps patient completed: Thread/unthread right pants leg, Thread/unthread  left pants leg, Don/Doff right shoe Lower body dressing/undressing: 2: Max-Patient completed 25-49% of tasks (reacher and LH shoe horn)  FIM - Toileting Toileting steps completed by patient:  ("just gas" and wearing hospital gown) Toileting: 0: Activity did not occur  FIM - Radio producer Devices: Elevated toilet seat, Walker (3in1 over commode) Toilet Transfers: 5-To toilet/BSC: Supervision (verbal cues/safety issues), 5-From toilet/BSC: Supervision (verbal cues/safety issues)  FIM - Control and instrumentation engineer Devices: Copy: 4: Chair or W/C > Bed: Min A (steadying Pt. > 75%), 4: Bed > Chair or W/C: Min A (steadying Pt. > 75%)  FIM - Locomotion: Wheelchair Distance: 150 Locomotion:  Wheelchair: 5: Travels 150 ft or more: maneuvers on rugs and over door sills with supervision, cueing or coaxing FIM - Locomotion: Ambulation Locomotion: Ambulation Assistive Devices: Administrator Ambulation/Gait Assistance: 4: Min assist Locomotion: Ambulation: 1: Travels less than 50 ft with minimal assistance (Pt.>75%)  Comprehension Comprehension Mode: Auditory Comprehension: 6-Follows complex conversation/direction: With extra time/assistive device  Expression Expression Mode: Verbal Expression: 7-Expresses complex ideas: With no assist  Social Interaction Social Interaction: 7-Interacts appropriately with others - No medications needed.  Problem Solving Problem Solving: 6-Solves complex problems: With extra time  Memory Memory: 6-More than reasonable amt of time  Medical Problem List and Plan: 1. Functional deficits secondary to multitrauma after a fall from a ladder including left iliac wing fracture, left superior and inferior pubic rami fractures, left rib and clavicle fracture . 2. DVT Prophylaxis/Anticoagulation: Subcutaneous Lovenox. Monitor platelet counts any signs of bleeding 3. Pain Management: Oxycodone  and Robaxin as needed. May consider scheduled narcotics 4. Urinary retention. Continue your choline and Flomax as directed. Check PVRs 3. Monitor with increased mobility. Follow-up urine study 5. Neuropsych: This patient is capable of making decisions on his own behalf. 6. Skin/Wound Care: Routine skin checks 7. Fluids/Electrolytes/Nutrition: Strict I and O's. Follow-up chemistries. Provide nutritional supplements as needed 8.  Constipation secondary to narcotic analgesics, will adjust laxatives LOS (Days) 3 A FACE TO FACE EVALUATION WAS PERFORMED  KIRSTEINS,ANDREW E 03/11/2014, 7:29 AM

## 2014-03-11 NOTE — Progress Notes (Signed)
[facility]patient Rehabilitation Center Individual Statement of Services  Patient Name:  Roger Wilson  Date:  03/11/2014  Welcome to the [facility].  Our goal is to provide you with an individualized program based on your diagnosis and situation, designed to meet your specific needs.  With this comprehensive rehabilitation program, you will be expected to participate in at least 3 hours of rehabilitation therapies Monday-Friday, with modified therapy programming on the weekends.  Your rehabilitation program will include the following services:  Physical Therapy (PT), Occupational Therapy (OT), 24 hour per day rehabilitation nursing, Case Management (Social Worker), Rehabilitation Medicine, Nutrition Services and Pharmacy Services  Weekly team conferences will be held on Wednesdays to discuss your progress.  Your Social Worker will talk with you frequently to get your input and to update you on team discussions.  Team conferences with you and your family in attendance may also be held.  Expected length of stay:  7 to 9 days  Overall anticipated outcome:  Modified Independent; supervision for stairs, shower transfers, standing for activities of daily living, bathing, and lower body dressing; minimal assistance for bed mobility and car transfers  Depending on your progress and recovery, your program may change. Your Social Worker will coordinate services and will keep you informed of any changes. Your Social Worker's name and contact numbers are listed  below.  The following services may also be recommended but are not provided by the [facility] will be made to provide these services after discharge if needed.  Arrangements include referral to agencies that provide these services.  Your insurance has been verified to  be:  [employer] Your primary doctor is:  Dr. Prince Solian  Pertinent information will be shared with your doctor and your insurance company.  Social Worker:  Alfonse Alpers, LCSW  [phone] or ([phone]  Information discussed with and copy given to patient by: Trey Sailors, 03/11/2014, 11:55 AM

## 2014-03-11 NOTE — Plan of Care (Signed)
Problem: RH Bed Mobility Goal: LTG Patient will perform bed mobility with assist (PT) LTG: Patient will perform bed mobility with assistance, with/without cues (PT).  Outcome: Not Applicable Date Met:  03/11/14 D/C bed mobility goal as pt unable to tolerate getting in/out of bed due to pain, have suggested he sleep in chair at home.   Problem: RH Ambulation Goal: LTG Patient will ambulate in controlled environment (PT) LTG: Patient will ambulate in a controlled environment, # of feet with assistance (PT).  Outcome: Not Applicable Date Met:  03/11/14 Downgraded gait distance due to increased pain during gait.   Problem: RH Stairs Goal: LTG Patient will ambulate up and down stairs w/assist (PT) LTG: Patient will ambulate up and down # of stairs with assistance (PT)  Downgraded to min A for safety with wife to assist.      

## 2014-03-11 NOTE — Progress Notes (Signed)
Physical Therapy Session Note  Patient Details  Name: Sharone Almond MRN: [id number] Date of Birth: 1955-05-10  Today's Date: 03/11/2014 PT Individual Time: 4818-5631 PT Individual Time Calculation (min): 60 min   Short Term Goals: Week 1:  PT Short Term Goal 1 (Week 1): = LTG due to short LOS  Skilled Therapeutic Interventions/Progress Updates:   Pt received sitting in w/c in room, agreeable to therapy session.  Discussed ELOS, expected outcomes, barriers to D/C home; ie stairs and bed mobility.  Educated pt that he could sleep in recliner when he first gets home for ease of getting up/down.  He states that he does have a leather arm chair with ottoman that he could sleep in until his pain improves.  Also discussed safety with sit<>stand as he tends to push on lower part of RW.  Pt declined safety concerns and continues to perform his way.  Performed gait x 45' x 1 rep with RW at S level.  Min cues for upright posture.  Pt demonstrating good clearance of LLE, however with decreased hip flex noted due to pain.  Pt also demonstrates very slow gait speed due to pain.  Pt self propelled remainder of distance to therapy gym x 100' (to/from) with BUEs at S level.  Transferred to/from therapy mat at S level with RW.  Assisted pt into reclined position on therapy mat with min A for elevating LLE onto mat, however requires max A for lowering LLE and also assisting trunk into sitting.  While in reclined position, performed supine therex for LLE; SLR x 10 reps, heel slides x 10 reps, and hip abd x 10 reps with use of leg lifter.  Pt continued to "shut down" and appear un-interested in therapist suggestions for getting up/down stairs and performing leg exercises during session.  Pt states that he feels he needs to use restroom, therefore transferred back to w/c and pt propelled half of distance to room with assist for remainder due to fatigue.  Ambulated into restroom with RW at S level.  Adjusted clothing at S  level.  Pt left in restroom to pull call [place] when ready.  Nurse tech notified.    Therapy Documentation Precautions:  Precautions Precautions: Fall Required Braces or Orthoses: Sling (sling for comfort? no formal order regarding wearing of sling or weightbearing status in the LUE) Restrictions Weight Bearing Restrictions: Yes LUE Weight Bearing: Weight bearing as tolerated LLE Weight Bearing: Weight bearing as tolerated  Pain: Pain Assessment Pain Assessment: 0-10 Pain Score: 6  Pain Type: Acute pain Pain Location: Hip Pain Orientation: Left Pain Descriptors / Indicators: Aching Pain Frequency: Intermittent Pain Onset: With Activity Pain Intervention(s): Medication (See eMAR)  See FIM for current functional status  Therapy/Group: Individual Therapy  Denice Bors 03/11/2014, 10:48 AM

## 2014-03-11 NOTE — Progress Notes (Signed)
Social Work Assessment and Plan  Patient Details  Name: Roger Wilson MRN: [id number] Date of Birth: February 13, 1956  Today's Date: 03/11/2014  Problem List:  Patient Active Problem List   Diagnosis Date Noted  . Trauma 03/08/2014  . Acute urinary retention 03/07/2014  . Fall from roof 03/07/2014  . Left rib fracture 03/07/2014  . Pneumothorax, traumatic 03/07/2014  . Fracture of left iliac wing 03/07/2014  . Closed fracture of ramus of left pubis 03/07/2014  . Fall 03/04/2014  . CELLULITIS, HAND, LEFT 12/17/2009  . RESIDUAL FOREIGN BODY IN SOFT TISSUE 10/21/2009   Past Medical History:  Past Medical History  Diagnosis Date  . Environmental allergies   . Hypertension    Past Surgical History:  Past Surgical History  Procedure Laterality Date  . Knee arthroscopy  2002    left   Social History:  reports that he has never smoked. He has never used smokeless tobacco. He reports that he drinks about 4.2 oz of alcohol per week. He reports that he does not use illicit drugs.  Family / Support Systems Marital Status: Married Patient Roles: Spouse, Parent, Other (Comment) (brother) Spouse/Significant Other: Roger Wilson - spouse - [phone] Children: dtr Anticipated Caregiver: self, wife and brothers potentially Ability/Limitations of Caregiver: Wife works 8:30 to 6 pm or later.  Wife can work some from home.  Has 2 brothers who may be able to assist while wife works per Development worker, international aid. Caregiver Availability: Intermittent (Wife can work some from home, but pt may be safe to be alone some once she helps him get set up in the mornings.) Family Dynamics: Pt reports good relationship with wife.  CSW has not yet met her.  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 Legal History/Current Legal Issues: None reported Guardian/Conservator: N/A   Abuse/Neglect Physical Abuse: Denies Verbal Abuse: Denies Sexual Abuse: Denies Exploitation of patient/patient's resources: Denies Self-Neglect: Denies  Emotional Status Pt's affect, behavior and adjustment status: Pt was sleepy during our visit due to recent pain medication and antihistamine for rash/itching.  He reports feeling "okay" emotionally and is trying to take things as they come.   Recent Psychosocial Issues: None reported Psychiatric History: None reported Substance Abuse History: None reported  Patient / Family Perceptions, Expectations & Goals Pt/Family understanding of illness & functional limitations: Pt has asked questions of medical team and they have explained pt's injuries.  Pt is unsure which orthopaedist he should f/u with.  CSW  Premorbid pt/family roles/activities: Pt enjoys cycling, working in the yard, being active.  He is a Animator. Anticipated changes in roles/activities/participation: Pt recognizes that he cannot go back to his job until he is fully healed.  He plans to try to stay patient nad continue to take things as they come. Pt/family expectations/goals: Pt wants to get back to being independent and active.  Community Resources [employer]s: None Premorbid Home Care/DME Agencies: None Transportation available at discharge: wife  Discharge Planning Living Arrangements: Spouse/significant other Support Systems: Spouse/significant other, Children, Other relatives, Friends/neighbors Type of Residence: Private residence Insurance Resources: Multimedia programmer (specify) Nurse, mental health) Financial Resources: Employment, Secondary school teacher Screen Referred: No Money Management: Patient, Spouse Does the patient have any problems obtaining  your medications?: No Home Management: Pt and wife  share household chores.  Wife can manage it during pt's recovery. Patient/Family Preliminary Plans: Pt plans to return home with his wife available as needed. Barriers to Discharge: Steps Social Work Anticipated Follow Up Needs: HH/OP Expected length of stay: 7 to 9 days  Clinical Impression CSW met with pt to introduce self and role of CSW, as well as complete assessment.  Pt was sleepy due to recent medications, but he was able to interact and answer CSW's questions.  He told CSW that his wife will either take some time off or work from home some while pt is recovering.  Pt's goals are such that she could help him in the mornings to get set up and then he could manage on his own until she gets home.  Wife was not present during CSW's visit to confirm this plan.  Pt may have some DME that was his mother-in-law's that he could use.  CSW recommended that pt's wife bring in DME for therapists to assess it and see if it is appropriate for pt.  CSW will assist with DME and f/u therapies for pt at d/c.  Roger Wilson, Roger Wilson 03/11/2014, 1:14 PM

## 2014-03-11 NOTE — Progress Notes (Signed)
Physical Therapy Session Note  Patient Details  Name: Roger Wilson MRN: [id number] Date of Birth: [birth year]  Today's Date: 03/11/2014 PT Individual Time: 1400-1500 PT Individual Time Calculation (min): 60 min   Short Term Goals: Week 1:  PT Short Term Goal 1 (Week 1): = LTG due to short LOS  Skilled Therapeutic Interventions/Progress Updates:   Pt received sitting in w/c in room, agreeable to therapy.  States that he may be due for pain meds soon.  Pt ambulated x 75' x 1 with RW at S level.  Provided cues for upright posture and increased hip/knee flex during L swing phase of gait.  Attempted to adjust RW for higher more appropriate height, however once adjusted, feels that it was too high, therefore adjusted it back down to more comfortable height for pt.  Pt self propelled remainder of distance to gym.  Negotiated up/down 2, 6" steps x 2 reps, once forwards with R hand rail to simulate back entrance and also again sideways using L rail to better UE support.  Pt also requires increased support with R handrail (mod A) and min A when ascending/descending sideways.  Discussed ease of getting to front set of stairs.  He states that it is approx 30' on paved sidewalk to get to stairs.  Feel that he will be able to do this easier, rather than having wife assist him.  Performed seated nustep x 13 mins at level 4 resistance with BUEs/LEs in order to increase overall strength, ROM in LLE, and endurance.  Tolerated well.  Ambulated back to room x 125' with RW at S level, with same cues mentioned above.  Left in w/c with all needs in reach.   Therapy Documentation Precautions:  Precautions Precautions: Fall Required Braces or Orthoses: Sling (sling for comfort? no formal order regarding wearing of sling or weightbearing status in the LUE) Restrictions Weight Bearing Restrictions: Yes LUE Weight Bearing: Weight bearing as tolerated LLE Weight Bearing: Weight bearing as tolerated   Vital  Signs: Therapy Vitals Temp: 98.4 F (36.9 C) Temp Source: Oral Pulse Rate: 71 Resp: 18 BP: 136/82 mmHg Patient Position (if appropriate): Sitting Oxygen Therapy SpO2: 98 % O2 Device: Not Delivered Pain: Pain Assessment Pain Assessment: 0-10 Pain Score: 9  Pain Type: Acute pain Pain Location: Hip Pain Orientation: Left Pain Descriptors / Indicators: Aching Pain Onset: Gradual Pain Intervention(s): Medication (See eMAR)   Locomotion : Ambulation Ambulation/Gait Assistance: 5: Supervision Wheelchair Mobility Distance: 100  See FIM for current functional status  Therapy/Group: Individual Therapy  Denice Bors 03/11/2014, 2:35 PM

## 2014-03-12 ENCOUNTER — Inpatient Hospital Stay (HOSPITAL_COMMUNITY): Payer: BC Managed Care – PPO | Admitting: Physical Therapy

## 2014-03-12 ENCOUNTER — Inpatient Hospital Stay (HOSPITAL_COMMUNITY): Payer: BC Managed Care – PPO | Admitting: Occupational Therapy

## 2014-03-12 ENCOUNTER — Inpatient Hospital Stay (HOSPITAL_COMMUNITY): Payer: BC Managed Care – PPO

## 2014-03-12 MED ORDER — FLEET ENEMA 7-19 GM/118ML RE ENEM
1.0000 | ENEMA | Freq: Once | RECTAL | Status: AC
  Administered 2014-03-12: 1 via RECTAL
  Filled 2014-03-12: qty 1

## 2014-03-12 NOTE — Progress Notes (Signed)
Social Work Patient ID: Roger Wilson, male   DOB: 03/19/1956, 58 y.o.   MRN: [id number]   CSW met with pt and spoke with pt's wife via telephone to update them on team conference and to let them know of d/c date of Friday, 03-14-14.  Pt was concerned if wife would be ready for him at home and wife was concerned that pt was not staying longer.  CSW explained that pt is meeting goals and that he does not meet criteria to continue to need intensive rehab.  Pt's wife understood this and is comfortable with him coming home if therapists feel he is ready.  Pt is pleased to be going home, just wanted to make sure his wife was ready.  CSW arranged HH for PT/OT and ordered a rolling walker and bedside commode.  CSW is available should needs arise.

## 2014-03-12 NOTE — Progress Notes (Signed)
Subjective/Complaints: 58 y.o. right handed male admitted 03/04/2014 after a fall from a roof approximately 10 feet with questionable loss of consciousness. Patient was independent prior to admission living with his wife. Complaints of left shoulder pain chest pain and left hip pain. Cranial CT scan showed a high left parietal scalp hematoma without skull fracture or intracranial abnormality. CT abdomen and pelvis showed a left iliac wing fracture with left superior and inferior pubic rami fracture as well as retroperitoneal hematoma, left small pneumothorax, left first rib fracture and left clavicle fracture. Follow-up orthopedic services Dr. Marcelino Scot and currently weightbearing as tolerated left lower extremity and shoulder sling for left distal clavicle fracture question weightbearing left upper extremity. CT 3-D of pelvis shows multiple fractures involving left iliac wing remaining external to the left acetabulum extending in a nondisplaced fracture plane into the left SI joint as well as fractures left superior and inferior pubic rami. Hospital course pain management. Subcutaneous Lovenox added for DVT prophylaxis. Bouts of urinary retention and monitored his mobility improves currently maintained on urecholine  Feels like something shifted and is not able to sit straight No BM x 4 days Slept better, more comfortable last noc Sup per PT/OT Objective: Vital Signs: Blood pressure 127/77, pulse 68, temperature 97.4 F (36.3 C), temperature source Oral, resp. rate 17, height $RemoveBe'5\' 11"'NamHkRqCl$  (1.803 m), weight 83.915 kg (185 lb), SpO2 96 %. No results found. Results for orders placed or performed during the hospital encounter of 03/08/14 (from the past 72 hour(s))  CBC WITH DIFFERENTIAL     Status: Abnormal   Collection Time: 03/10/14  4:46 AM  Result Value Ref Range   WBC 5.8 4.0 - 10.5 K/uL   RBC 4.28 4.22 - 5.81 MIL/uL   Hemoglobin 12.7 (L) 13.0 - 17.0 g/dL   HCT 36.3 (L) 39.0 - 52.0 %   MCV 84.8 78.0  - 100.0 fL   MCH 29.7 26.0 - 34.0 pg   MCHC 35.0 30.0 - 36.0 g/dL   RDW 12.4 11.5 - 15.5 %   Platelets 153 150 - 400 K/uL   Neutrophils Relative % 50 43 - 77 %   Neutro Abs 2.9 1.7 - 7.7 K/uL   Lymphocytes Relative 23 12 - 46 %   Lymphs Abs 1.4 0.7 - 4.0 K/uL   Monocytes Relative 13 (H) 3 - 12 %   Monocytes Absolute 0.8 0.1 - 1.0 K/uL   Eosinophils Relative 14 (H) 0 - 5 %   Eosinophils Absolute 0.8 (H) 0.0 - 0.7 K/uL   Basophils Relative 0 0 - 1 %   Basophils Absolute 0.0 0.0 - 0.1 K/uL  Comprehensive metabolic panel     Status: Abnormal   Collection Time: 03/10/14  4:46 AM  Result Value Ref Range   Sodium 138 137 - 147 mEq/L   Potassium 4.4 3.7 - 5.3 mEq/L   Chloride 100 96 - 112 mEq/L   CO2 27 19 - 32 mEq/L   Glucose, Bld 99 70 - 99 mg/dL   BUN 13 6 - 23 mg/dL   Creatinine, Ser 0.76 0.50 - 1.35 mg/dL   Calcium 8.5 8.4 - 10.5 mg/dL   Total Protein 5.9 (L) 6.0 - 8.3 g/dL   Albumin 2.7 (L) 3.5 - 5.2 g/dL   AST 59 (H) 0 - 37 U/L   ALT 47 0 - 53 U/L   Alkaline Phosphatase 46 39 - 117 U/L   Total Bilirubin 0.9 0.3 - 1.2 mg/dL   GFR calc non Af  Amer >90 >90 mL/min   GFR calc Af Amer >90 >90 mL/min    Comment: (NOTE) The eGFR has been calculated using the CKD EPI equation. This calculation has not been validated in all clinical situations. eGFR's persistently <90 mL/min signify possible Chronic Kidney Disease.    Anion gap 11 5 - 15     HEENT: normal Cardio: RRR Resp: CTA B/L and unlabored GI: BS positive and NT, ND Extremity:  No Edema Skin:   Other pruritis buttocks Neuro: Alert/Oriented, Normal Motor on Right and Abnormal Motor 2- Left delt, 4- BI, tri, grip, 2- L HF, 3- KE, 4/5 L ank;e DF/PF Musc/Skel:  Extremity tender Left shoulder and Left groin Gen NAD, GU Foley   Assessment/Plan: 1. Functional deficits secondary to multitrauma after a fall from a ladder including left iliac wing fracture, left superior and inferior pubic rami fractures, left rib and clavicle  fracture  which require 3+ hours per day of interdisciplinary therapy in a comprehensive inpatient rehab setting. Physiatrist is providing close team supervision and 24 hour management of active medical problems listed below. Physiatrist and rehab team continue to assess barriers to discharge/monitor patient progress toward functional and medical goals. FIM: FIM - Bathing Bathing Steps Patient Completed: Chest, Right Arm, Left Arm, Abdomen, Front perineal area, Right upper leg, Left upper leg, Right lower leg (including foot), Left lower leg (including foot) Bathing: 4: Min-Patient completes 8-9 56f 10 parts or 75+ percent  FIM - Upper Body Dressing/Undressing Upper body dressing/undressing steps patient completed: Thread/unthread right sleeve of pullover shirt/dresss, Thread/unthread left sleeve of pullover shirt/dress, Put head through opening of pull over shirt/dress, Pull shirt over trunk Upper body dressing/undressing: 5: Set-up assist to: Obtain clothing/put away FIM - Lower Body Dressing/Undressing Lower body dressing/undressing steps patient completed: Thread/unthread right pants leg, Thread/unthread left pants leg, Pull pants up/down, Fasten/unfasten pants, Don/Doff right shoe, Don/Doff left shoe Lower body dressing/undressing: 4: Min-Patient completed 75 plus % of tasks (using reacher, LH shoe horn, elastic laces)  FIM - Toileting Toileting steps completed by patient: Adjust clothing prior to toileting, Performs perineal hygiene, Adjust clothing after toileting Toileting: 6: More than reasonable amount of time  FIM - Radio producer Devices: Bedside commode, Environmental consultant, Grab bars Toilet Transfers: 5-To toilet/BSC: Supervision (verbal cues/safety issues), 5-From toilet/BSC: Supervision (verbal cues/safety issues)  FIM - Control and instrumentation engineer Devices: Copy: 4: Sit > Supine: Min A (steadying pt. > 75%/lift 1 leg),  2: Supine > Sit: Max A (lifting assist/Pt. 25-49%), 5: Chair or W/C > Bed: Supervision (verbal cues/safety issues), 5: Bed > Chair or W/C: Supervision (verbal cues/safety issues) (from therapy mat in gym)  FIM - Locomotion: Wheelchair Distance: 100 Locomotion: Wheelchair: 2: Travels 50 - 149 ft with supervision, cueing or coaxing FIM - Locomotion: Ambulation Locomotion: Ambulation Assistive Devices: Administrator Ambulation/Gait Assistance: 5: Supervision Locomotion: Ambulation: 1: Travels less than 50 ft with supervision/safety issues  Comprehension Comprehension Mode: Auditory Comprehension: 7-Follows complex conversation/direction: With no assist  Expression Expression Mode: Verbal Expression: 7-Expresses complex ideas: With no assist  Social Interaction Social Interaction: 7-Interacts appropriately with others - No medications needed.  Problem Solving Problem Solving: 7-Solves complex problems: Recognizes & self-corrects  Memory Memory: 7-Complete Independence: No helper  Medical Problem List and Plan: 1. Functional deficits secondary to multitrauma after a fall from a ladder including left iliac wing fracture, left superior and inferior pubic rami fractures, left rib and clavicle fracture .Recheck pelvic xray 2. DVT  Prophylaxis/Anticoagulation: Subcutaneous Lovenox. Monitor platelet counts any signs of bleeding 3. Pain Management: Oxycodone and Robaxin as needed. May consider scheduled narcotics 4. Urinary retention. Continue your choline and Flomax as directed. Check PVRs 3. Monitor with increased mobility. Follow-up urine study 5. Neuropsych: This patient is capable of making decisions on his own behalf. 6. Skin/Wound Care: Routine skin checks 7. Fluids/Electrolytes/Nutrition: Strict I and O's. Follow-up chemistries. Provide nutritional supplements as needed 8.  Constipation secondary to narcotic analgesics, will adjust laxatives, suppository today if no BM LOS (Days)  4 A FACE TO FACE EVALUATION WAS PERFORMED  Darnelle Derrick E 03/12/2014, 7:46 AM

## 2014-03-12 NOTE — Progress Notes (Signed)
Occupational Therapy Session Note  Patient Details  Name: Roger Wilson MRN: [id number] Date of Birth: [birth year]  Today's Date: 03/12/2014 OT Individual Time: 2707-8675 and 4492-0100 OT Individual Time Calculation (min): 60 min and 60 min   Short Term Goals: Week 1:  OT Short Term Goal 1 (Week 1): STGs equal to LTGs set at modified independent to supervision based on ELOS.  Skilled Therapeutic Interventions/Progress Updates:  Session 1: Upon entering the room, pt seated in recliner chair with wife present for session/education. Pt with intermittent 8/10 c/o pain in L hip this session. Pt ambulated with use of RW to obtain clothing from dresser with use of [place] reacher and supervision.  Toileting with Mod I for clothing management and hygiene this session. Showering with use of LH sponge and Min A to wash buttocks as pt refused to wash secondary to movement "causing pain". Dressing performed seated in wheelchair with use of LH reacher and shoe horn to complete tasks with supervision this session. Grooming performed at sink side with supervision and pt seated in wheelchair upon exiting the room. OT discussed with pt and family safety risks within the equipment and recommended BSC as pt has TTB at home already.  Session 2: Upon entering room, pt seated in recliner chair awaiting therapist with 7/10 c/o pain in L hip. Pt propelled self in wheelchair 250+ feet for B UE strengthening and endurance down to elevator for community mobility tasks. Pt ambulated ~ 150 feet outside on variety of brick and pavement with RW with Mod I. Once returning to rehab unit pt propelled wheelchair into kitchen for simple meal prep goal. Pt ambulated with RW and obtained items needed to cook soup on stove. Pt performed hand hygiene, adjusted temperature appropriately, and maintained precautions independently during session. Pt utilized counter top to move items from one end of kitchen to the other with Mod I as well. Pt  ambulated with RW ~ 75 feet back to room with Mod I. Pt seated in wheelchair with ice pack on L hip per pt request. Call Boozer and all needed items within reach upon exiting the room.   Therapy Documentation Precautions:  Precautions Precautions: Fall Required Braces or Orthoses: Sling (sling for comfort? no formal order regarding wearing of sling or weightbearing status in the LUE) Restrictions Weight Bearing Restrictions: Yes LUE Weight Bearing: Weight bearing as tolerated LLE Weight Bearing: Weight bearing as tolerated Pain: Pain Assessment Pain Assessment: 0-10 Pain Score: 8  Faces Pain Scale: Hurts a little bit Pain Type: Acute pain Pain Location: Hip Pain Orientation: Left Pain Descriptors / Indicators: Tightness;Sharp Pain Onset: With Activity Patients Stated Pain Goal: 2 Pain Intervention(s): RN made aware;Repositioned;Medication (See eMAR)  See FIM for current functional status  Therapy/Group: Individual Therapy  Phineas Semen 03/12/2014, 11:32 AM

## 2014-03-12 NOTE — Progress Notes (Signed)
Physical Therapy Session Note  Patient Details  Name: Roger Wilson MRN: [id number] Date of Birth: 01-13-1956  Today's Date: 03/12/2014 PT Individual Time: 0830-0930 PT Individual Time Calculation (min): 60 min   Short Term Goals: Week 1:  PT Short Term Goal 1 (Week 1): = LTG due to short LOS  Skilled Therapeutic Interventions/Progress Updates:   Pt received sitting in w/c, wife departing. Gait training room > ortho gym using RW with supervision, one standing rest break for pt to receive pain medication due to increase in pain with ambulation 8/10 in L hip. Pt maintains trunk forward flexed and downward gaze during ambulation, unreceptive to verbal cues for upright posture and increased L hip/knee flexion during terminal stance and initial swing phase of gait. With cues to push up from w/c for sit > stand, pt reports he is unable to reach arm rests and pushes up from cross bar underneath hand grip on RW. Pt also performs anterior weight shift in chair by pulling on w/c cushion in between legs instead of using Pt performed car transfer to sedan height using RW with verbal cues for technique and seat reclined, min A for bringing RLE into car and greatly increased time. Pt ambulated toward therapy gym x 50 ft using RW and propelled rest of way in w/c using BUE at supervision level. Pt performed standing therex with RW for UE support: heel raises to fatigue, L knee flexion x 10; seated therex: B LAQ x 10 each LE,  B toe raises to fatigue. Pt with c/o "pulling and muscle spasms" at L hip. Transitioned to NuStep using BUE/LE at Level 4 x 6 min. Pt returned to room and left sitting in w/c with all needs within reach.  Therapy Documentation Precautions:  Precautions Precautions: Fall Required Braces or Orthoses: Sling (sling for comfort? no formal order regarding wearing of sling or weightbearing status in the LUE) Restrictions Weight Bearing Restrictions: Yes LUE Weight Bearing: Weight bearing as  tolerated LLE Weight Bearing: Weight bearing as tolerated Vital Signs: Therapy Vitals Temp: 97.4 F (36.3 C) Temp Source: Oral Pulse Rate: 68 Resp: 17 BP: 127/77 mmHg Oxygen Therapy SpO2: 96 % O2 Device: Not Delivered Pain: Pain Assessment Pain Assessment: 0-10 Pain Score: 8  Pain Type: Acute pain Pain Location: Hip Pain Orientation: Left Pain Descriptors / Indicators: Tightness;Sharp Pain Onset: With Activity Pain Intervention(s): RN made aware;Repositioned;Medication (See eMAR)  See FIM for current functional status  Therapy/Group: Individual Therapy  Laretta Alstrom 03/12/2014, 9:25 AM

## 2014-03-12 NOTE — Plan of Care (Signed)
Problem: RH BOWEL ELIMINATION Goal: RH STG MANAGE BOWEL WITH ASSISTANCE STG Manage Bowel with min. Assistance.  Outcome: Progressing Goal: RH STG MANAGE BOWEL W/MEDICATION W/ASSISTANCE STG Manage Bowel with Medication with min. Assistance.  Outcome: Progressing  Problem: RH BLADDER ELIMINATION Goal: RH STG MANAGE BLADDER WITH MEDICATION WITH ASSISTANCE STG Manage Bladder With Medication With min. Assistance.  Outcome: Progressing  Problem: RH SKIN INTEGRITY Goal: RH STG SKIN FREE OF INFECTION/BREAKDOWN With min.assist  Outcome: Progressing Goal: RH STG MAINTAIN SKIN INTEGRITY WITH ASSISTANCE STG Maintain Skin Integrity With min.Assistance.  Outcome: Progressing  Problem: RH SAFETY Goal: RH STG ADHERE TO SAFETY PRECAUTIONS W/ASSISTANCE/DEVICE STG Adhere to Safety Precautions With mod. Assistance/Device.  Outcome: Progressing Goal: RH STG DECREASED RISK OF FALL WITH ASSISTANCE STG Decreased Risk of Fall With Assistance.  Outcome: Progressing Goal: RH OTHER STG SAFETY GOALS W/ASSIST Other STG Safety Goals With Assistance.  Outcome: Progressing

## 2014-03-12 NOTE — Patient Care Conference (Signed)
Inpatient RehabilitationTeam Conference and Plan of Care Update Date: 03/12/2014   Time: 11:25 AM   Patient Name: Roger Wilson      Medical Record Number: [id number]  Date of Birth: 10-14-1955 Sex: Male         Room/Bed: 4M07C/4M07C-01 Payor Info: Payor: [place] / Plan: [employer] PPO OUT OF STATE / Product Type: *No Product type* /    Admitting Diagnosis: multiple fracture  Admit Date/Time:  03/08/2014  2:36 PM Admission Comments: No comment available   Primary Diagnosis:  <principal problem not specified> Principal Problem: <principal problem not specified>  Patient Active Problem List   Diagnosis Date Noted  . Trauma 03/08/2014  . Acute urinary retention 03/07/2014  . Fall from roof 03/07/2014  . Left rib fracture 03/07/2014  . Pneumothorax, traumatic 03/07/2014  . Fracture of left iliac wing 03/07/2014  . Closed fracture of ramus of left pubis 03/07/2014  . Fall 03/04/2014  . CELLULITIS, HAND, LEFT 12/17/2009  . RESIDUAL FOREIGN BODY IN SOFT TISSUE 10/21/2009    Expected Discharge Date: Expected Discharge Date: 03/14/14  Team Members Present: Physician leading conference: Dr. Alysia Penna Social Worker Present: Alfonse Alpers, LCSW;Becky DuPree, LCSW Nurse Present: Heather Roberts, RN PT Present: Raylene Everts, PT;Blair Hobble, PT OT Present: Clyda Greener, Rhetta Mura, OT SLP Present: Windell Moulding, SLP PPS Coordinator present : Daiva Nakayama, RN, CRRN     Current Status/Progress Goal Weekly Team Focus  Medical   Pain control, pain mainly during transitional movements, severe constipation no BM  Maintain adequate pain control to ensure adequate participation in therapy  Adjust pain medications as well as laxatives   Bowel/Bladder   Pt is continent of bowel and bladder. Pt is experiecing constipation  For patient to have a routine bowel regimen, with out constipation  Continue with medication regimen to prevent contipation and encougre hydration and  ambulation.   Swallow/Nutrition/ Hydration             ADL's   S for STS, functional transfers, and dressing. Toileting with Mod I this session. Bathing with Min A to wash buttocks. Pt use of AE for ADL tasks.   S to Mod I  ADLs, functional mobility, pt/family education   Mobility   Pt currently max to total A for bed mobility (have suggested he sleep in chair with ottoman at home for ease of getting up/down), S for sit<>stand, transfers and gait with RW.  Pt mostly limited by pain, however when given suggestions for improvement or importance of safety techniques, he tends to state how he is going to things and demonstrates decreased acceptance to feedback.    S to mod I with min A car transfers and stairs. (D/C'd bed mobility goals)  Transfers, stairs, gait, pt/family education.    Communication             Safety/Cognition/ Behavioral Observations            Pain   Patient is still experiencing moderate pain  For patient to have a tolerable pain goal between 2-3  For patient to participate in therapy and maintain a tolerable pain level.   Skin   not applicable          Rehab Goals Patient on target to meet rehab goals: Yes Rehab Goals Revised: None *See Care Plan and progress notes for long and short-term goals.  Barriers to Discharge: See above    Possible Resolutions to Barriers:  See above    Discharge  Planning/Teaching Needs:  Pt plans to go home to the care of his wife and HH.  Pt's wife to attend family education on Friday morning.   Team Discussion:  Pt's 3rd pelvic x-ray remains the same per MD.  Pt continues to have a lot of pain.  Pt will need a walker and bedside commode.  He may need a shower seat, wife is checking on this.  RN is working to regulate pt's bowels. Pt to d/c after therapies/family education on Friday.  Revisions to Treatment Plan:  None   Continued Need for Acute Rehabilitation Level of Care: The patient requires daily medical management by a  physician with specialized training in physical medicine and rehabilitation for the following conditions: Daily direction of a multidisciplinary physical rehabilitation program to ensure safe treatment while eliciting the highest outcome that is of practical value to the patient.: Yes Daily medical management of patient stability for increased activity during participation in an intensive rehabilitation regime.: Yes Daily analysis of laboratory values and/or radiology reports with any subsequent need for medication adjustment of medical intervention for : Other;Post surgical problems  Adalis Gatti, Silvestre Mesi 03/12/2014, 4:26 PM

## 2014-03-13 NOTE — Plan of Care (Signed)
Problem: RH BOWEL ELIMINATION Goal: RH STG MANAGE BOWEL WITH ASSISTANCE STG Manage Bowel with min. Assistance.  Outcome: Progressing Goal: RH STG MANAGE BOWEL W/MEDICATION W/ASSISTANCE STG Manage Bowel with Medication with min. Assistance.  Outcome: Not Progressing  Problem: RH BLADDER ELIMINATION Goal: RH STG MANAGE BLADDER WITH MEDICATION WITH ASSISTANCE STG Manage Bladder With Medication With min. Assistance.  Outcome: Progressing Goal: RH STG MANAGE BLADDER WITH EQUIPMENT WITH ASSISTANCE STG Manage Bladder With Equipment With min. Assistance  Outcome: Not Applicable Date Met:  06/77/03  Problem: RH SKIN INTEGRITY Goal: RH STG SKIN FREE OF INFECTION/BREAKDOWN With min.assist  Outcome: Progressing Goal: RH STG MAINTAIN SKIN INTEGRITY WITH ASSISTANCE STG Maintain Skin Integrity With min.Assistance.  Outcome: Progressing  Problem: RH SAFETY Goal: RH STG ADHERE TO SAFETY PRECAUTIONS W/ASSISTANCE/DEVICE STG Adhere to Safety Precautions With mod. Assistance/Device.  Outcome: Progressing Goal: RH STG DECREASED RISK OF FALL WITH ASSISTANCE STG Decreased Risk of Fall With Assistance.  Outcome: Progressing

## 2014-03-13 NOTE — Discharge Summary (Signed)
Discharge summary job # [id number]

## 2014-03-13 NOTE — Progress Notes (Signed)
Subjective/Complaints: 58 y.o. right handed male admitted 03/04/2014 after a fall from a roof approximately 10 feet with questionable loss of consciousness. Patient was independent prior to admission living with his wife. Complaints of left shoulder pain chest pain and left hip pain. Cranial CT scan showed a high left parietal scalp hematoma without skull fracture or intracranial abnormality. CT abdomen and pelvis showed a left iliac wing fracture with left superior and inferior pubic rami fracture as well as retroperitoneal hematoma, left small pneumothorax, left first rib fracture and left clavicle fracture. Follow-up orthopedic services Dr. Marcelino Scot and currently weightbearing as tolerated left lower extremity and shoulder sling for left distal clavicle fracture question weightbearing left upper extremity. CT 3-D of pelvis shows multiple fractures involving left iliac wing remaining external to the left acetabulum extending in a nondisplaced fracture plane into the left SI joint as well as fractures left superior and inferior pubic rami. Hospital course pain management. Subcutaneous Lovenox added for DVT prophylaxis. Bouts of urinary retention and monitored his mobility improves currently maintained on urecholine   Had a reasonable night. Pain tolerable. A little disappointed he's not going to be here longer but seems to understand reasoning behind discharge and is ok.  Objective: Vital Signs: Blood pressure 129/76, pulse 65, temperature 98.5 F (36.9 C), temperature source Oral, resp. rate 17, height 5\' 11"  (1.803 m), weight 83.371 kg (183 lb 12.8 oz), SpO2 96 %. Dg Pelvis Comp Min 3v  03/12/2014   CLINICAL DATA:  Pelvic fractures, left hip pain  EXAM: JUDET PELVIS - 3+ VIEW  COMPARISON:  03/07/2014  FINDINGS: Three Judet views of the pelvic submitted. Again noted comminuted fractures of the left ilium. Stable from prior exam. Again noted fracture of left superior and inferior pubic ramus. Again  noted fracture along of medial acetabulum breaching the ileal pubic line. Mild displaced iliac bone fracture superolateral to acetabulum again noted. Second fracture line in lateral iliac wing is stable. SI joints are symmetrical in appearance. No change in alignment to pubic symphysis.  IMPRESSION: Again noted comminuted left hemi pelvic fractures as described above. No significant change from prior exam.   Electronically Signed   By: Lahoma Crocker M.D.   On: 03/12/2014 10:30   No results found for this or any previous visit (from the past 72 hour(s)).   HEENT: normal Cardio: RRR Resp: CTA B/L and unlabored GI: BS positive and NT, ND Extremity:  No Edema Skin:   Other pruritis buttocks Neuro: Alert/Oriented, Normal Motor on Right and Abnormal Motor 2- Left delt, 4- BI, tri, grip, 2- L HF, 3- KE, 4/5 L ank;e DF/PF Musc/Skel:  Extremity tender Left shoulder and Left groin Gen NAD, GU  Foley out   Assessment/Plan: 1. Functional deficits secondary to multitrauma after a fall from a ladder including left iliac wing fracture, left superior and inferior pubic rami fractures, left rib and clavicle fracture  which require 3+ hours per day of interdisciplinary therapy in a comprehensive inpatient rehab setting. Physiatrist is providing close team supervision and 24 hour management of active medical problems listed below. Physiatrist and rehab team continue to assess barriers to discharge/monitor patient progress toward functional and medical goals. FIM: FIM - Bathing Bathing Steps Patient Completed: Chest, Right Arm, Left Arm, Abdomen, Front perineal area, Right upper leg, Left upper leg, Right lower leg (including foot) Bathing: 4: Min-Patient completes 8-9 44f 10 parts or 75+ percent  FIM - Upper Body Dressing/Undressing Upper body dressing/undressing steps patient completed: Thread/unthread right sleeve  of pullover shirt/dresss, Thread/unthread left sleeve of pullover shirt/dress, Put head through  opening of pull over shirt/dress, Pull shirt over trunk Upper body dressing/undressing: 5: Supervision: Safety issues/verbal cues FIM - Lower Body Dressing/Undressing Lower body dressing/undressing steps patient completed: Thread/unthread right underwear leg, Thread/unthread left underwear leg, Pull underwear up/down, Thread/unthread right pants leg, Thread/unthread left pants leg, Pull pants up/down, Don/Doff right shoe, Don/Doff left shoe Lower body dressing/undressing: 5: Supervision: Safety issues/verbal cues  FIM - Toileting Toileting steps completed by patient: Adjust clothing prior to toileting, Performs perineal hygiene, Adjust clothing after toileting Toileting: 6: More than reasonable amount of time  FIM - Radio producer Devices: Bedside commode, Environmental consultant, Grab bars Toilet Transfers: 5-To toilet/BSC: Supervision (verbal cues/safety issues), 5-From toilet/BSC: Supervision (verbal cues/safety issues)  FIM - Control and instrumentation engineer Devices: Copy: 5: Bed > Chair or W/C: Supervision (verbal cues/safety issues), 5: Chair or W/C > Bed: Supervision (verbal cues/safety issues)  FIM - Locomotion: Wheelchair Distance: 50 Locomotion: Wheelchair: 2: Travels 20 - 149 ft with supervision, cueing or coaxing FIM - Locomotion: Ambulation Locomotion: Ambulation Assistive Devices: Administrator Ambulation/Gait Assistance: 5: Supervision Locomotion: Ambulation: 5: Travels 150 ft or more with supervision/safety issues  Comprehension Comprehension Mode: Auditory Comprehension: 7-Follows complex conversation/direction: With no assist  Expression Expression Mode: Verbal Expression: 7-Expresses complex ideas: With no assist  Social Interaction Social Interaction: 7-Interacts appropriately with others - No medications needed.  Problem Solving Problem Solving: 7-Solves complex problems: Recognizes &  self-corrects  Memory Memory: 7-Complete Independence: No helper  Medical Problem List and Plan: 1. Functional deficits secondary to multitrauma after a fall from a ladder including left iliac wing fracture, left superior and inferior pubic rami fractures, left rib and clavicle fracture   -reviewed pelvic xrays with patient---no changes 2. DVT Prophylaxis/Anticoagulation: Subcutaneous Lovenox. Monitor platelet counts any signs of bleeding 3. Pain Management: Oxycodone and Robaxin as needed.   4. Urinary retention. Continue your choline and Flomax as directed. Check PVRs 3. Monitor with increased mobility. Follow-up urine study 5. Neuropsych: This patient is capable of making decisions on his own behalf. 6. Skin/Wound Care: Routine skin checks 7. Fluids/Electrolytes/Nutrition: Strict I and O's. Follow-up chemistries. Provide nutritional supplements as needed 8.  Constipation laxatives adjusted. Had large bm yesterday LOS (Days) 5 A FACE TO FACE EVALUATION WAS PERFORMED  SWARTZ,ZACHARY T 03/13/2014, 7:36 AM

## 2014-03-14 ENCOUNTER — Encounter (HOSPITAL_COMMUNITY): Payer: BC Managed Care – PPO | Admitting: Occupational Therapy

## 2014-03-14 ENCOUNTER — Inpatient Hospital Stay (HOSPITAL_COMMUNITY): Payer: BC Managed Care – PPO | Admitting: Physical Therapy

## 2014-03-14 ENCOUNTER — Encounter (HOSPITAL_COMMUNITY): Payer: Self-pay

## 2014-03-14 DIAGNOSIS — S32302S Unspecified fracture of left ilium, sequela: Secondary | ICD-10-CM

## 2014-03-14 DIAGNOSIS — S32502S Unspecified fracture of left pubis, sequela: Secondary | ICD-10-CM

## 2014-03-14 DIAGNOSIS — S2232XS Fracture of one rib, left side, sequela: Secondary | ICD-10-CM

## 2014-03-14 MED ORDER — METHOCARBAMOL 500 MG PO TABS: 500.0000 mg | ORAL_TABLET | Freq: Four times a day (QID) | ORAL | Status: DC | PRN

## 2014-03-14 MED ORDER — OXYCODONE HCL ER 10 MG PO T12A: EXTENDED_RELEASE_TABLET | ORAL | Status: DC

## 2014-03-14 MED ORDER — OXYCODONE HCL 10 MG PO TABS: 10.0000 mg | ORAL_TABLET | ORAL | Status: DC | PRN

## 2014-03-14 MED ORDER — POLYETHYLENE GLYCOL 3350 17 G PO PACK: 17.0000 g | PACK | Freq: Two times a day (BID) | ORAL | Status: DC

## 2014-03-14 MED ORDER — FLEET ENEMA 7-19 GM/118ML RE ENEM
1.0000 | ENEMA | Freq: Once | RECTAL | Status: DC
  Filled 2014-03-14: qty 1

## 2014-03-14 MED ORDER — TAMSULOSIN HCL 0.4 MG PO CAPS: 0.4000 mg | ORAL_CAPSULE | Freq: Every day | ORAL | Status: DC

## 2014-03-14 MED ORDER — PANTOPRAZOLE SODIUM 40 MG PO TBEC: 40.0000 mg | DELAYED_RELEASE_TABLET | Freq: Every day | ORAL | Status: DC

## 2014-03-14 NOTE — Plan of Care (Signed)
Problem: RH SAFETY Goal: RH STG ADHERE TO SAFETY PRECAUTIONS W/ASSISTANCE/DEVICE STG Adhere to Safety Precautions With mod. Assistance/Device.  Outcome: Progressing

## 2014-03-14 NOTE — Plan of Care (Signed)
Problem: RH Balance Goal: LTG Patient will maintain dynamic standing with ADLs (OT) LTG: Patient will maintain dynamic standing balance with assist during activities of daily living (OT)  Outcome: Completed/Met Date Met:  03/14/14  Problem: RH Grooming Goal: LTG Patient will perform grooming w/assist,cues/equip (OT) LTG: Patient will perform grooming with assist, with/without cues using equipment (OT)  Outcome: Completed/Met Date Met:  03/14/14  Problem: RH Bathing Goal: LTG Patient will bathe with assist, cues/equipment (OT) LTG: Patient will bathe specified number of body parts with assist with/without cues using equipment (position) (OT)  Outcome: Completed/Met Date Met:  03/14/14  Problem: RH Dressing Goal: LTG Patient will perform upper body dressing (OT) LTG Patient will perform upper body dressing with assist, with/without cues (OT).  Outcome: Completed/Met Date Met:  03/14/14 Goal: LTG Patient will perform lower body dressing w/assist (OT) LTG: Patient will perform lower body dressing with assist, with/without cues in positioning using equipment (OT)  Outcome: Completed/Met Date Met:  03/14/14  Problem: RH Toileting Goal: LTG Patient will perform toileting w/assist, cues/equip (OT) LTG: Patient will perform toiletiing (clothes management/hygiene) with assist, with/without cues using equipment (OT)  Outcome: Completed/Met Date Met:  03/14/14  Problem: RH Simple Meal Prep Goal: LTG Patient will perform simple meal prep w/assist (OT) LTG: Patient will perform simple meal prep with assistance, with/without cues (OT).  Outcome: Completed/Met Date Met:  03/14/14  Problem: RH Toilet Transfers Goal: LTG Patient will perform toilet transfers w/assist (OT) LTG: Patient will perform toilet transfers with assist, with/without cues using equipment (OT)  Outcome: Completed/Met Date Met:  03/14/14  Problem: RH Tub/Shower Transfers Goal: LTG Patient will perform tub/shower transfers  w/assist (OT) LTG: Patient will perform tub/shower transfers with assist, with/without cues using equipment (OT)  Outcome: Completed/Met Date Met:  03/14/14

## 2014-03-14 NOTE — Progress Notes (Signed)
Physical Therapy Discharge Summary  Patient Details  Name: Roger Wilson MRN: [id number] Date of Birth: [birth year]  Today's Date: 03/14/2014 PT Individual Time: 1030-1120 PT Individual Time Calculation (min): 50 min    Patient has met 9 of 9 long term goals due to improved activity tolerance, improved balance, improved postural control, increased strength, increased range of motion, decreased pain, ability to compensate for deficits and functional use of  left upper extremity and left lower extremity.  Patient to discharge at an household ambulatory level Modified Independent-supervision.   Patient's care partner is independent to provide the necessary physical assistance at discharge.   Reasons goals not met: NA  Recommendation:  Patient will benefit from ongoing skilled PT services in home health setting to continue to advance safe functional mobility, address ongoing impairments in pain, muscle weakness and muscle joint tightness, decreased endurance, decreased standing balance and decreased postural control, and minimize fall risk.  Equipment: RW, wheelchair, bedside commode  Reasons for discharge: treatment goals met and discharge from hospital  Patient/family agrees with progress made and goals achieved: Yes  Skilled Therapeutic Intervention: Patient received sitting in recliner, wife present for family training. Patient exceeded goals and is at overall mod I level except supervision for car transfers and stair negotiation. Pt ambulated using RW 150 ft x 2 in controlled environment and 50 ft in home environment, mod I. Pt performed car transfer using RW with supervision and increased time, verbal cues for technique. Pt's wife instructed in moving seat back and reclining seat to decrease pain with hip flexion. Pt negotiated up/down 5 stairs using R rail ascending with step-to pattern and wife providing supervision for safety. Pt refused practicing bed mobility due to anticipated pain  and plans to sleep in recliner at discharge. Reviewed HEP handout with patient for BLE strengthening/ROM. Patient and wife with no further questions regarding discharge. Pt and wife reporting that w/c at home is broken and CSW notified to order patient 18x18 wheelchair. Patient's RW and BSC delivered, fitted RW for patient's height. Pt left sitting in recliner with all needs within reach, wife present.   PT Discharge Precautions/Restrictions Precautions Precautions: Fall Restrictions Weight Bearing Restrictions: Yes LUE Weight Bearing: Weight bearing as tolerated LLE Weight Bearing: Weight bearing as tolerated Pain Pain Assessment Pain Assessment: 0-10 Pain Score: 8  Faces Pain Scale: Hurts a little bit Pain Type: Acute pain Pain Location: Hip Pain Orientation: Left Pain Descriptors / Indicators: Aching;Sharp Pain Frequency: Intermittent Pain Onset: With Activity Patients Stated Pain Goal: 4 Pain Intervention(s): Repositioned;Rest Multiple Pain Sites: No 2nd Pain Site Pain Score: 5 Pain Type: Acute pain Pain Location: Pelvis Pain Orientation: Left Pain Descriptors / Indicators: Aching Pain Frequency: Intermittent Pain Onset: With Activity Patient's Stated Pain Goal: 3 Pain Intervention(s): Medication (See eMAR) PAINAD (Pain Assessment in Advanced Dementia) Breathing: normal Vision/Perception   No changes from baseline  Cognition Overall Cognitive Status: Within Functional Limits for tasks assessed Orientation Level: Oriented X4 Attention: Sustained;Selective Sustained Attention: Appears intact Memory: Appears intact Awareness: Appears intact Problem Solving: Appears intact Safety/Judgment: Appears intact Sensation Sensation Light Touch: Impaired by gross assessment Stereognosis: Appears Intact Hot/Cold: Appears Intact Proprioception: Appears Intact Additional Comments: pt reports numbness in L hip joint Coordination Gross Motor Movements are Fluid and  Coordinated: No Fine Motor Movements are Fluid and Coordinated: Yes Coordination and Movement Description: Limited by L LE pain Motor  Motor Motor: Other (comment) Motor - Discharge Observations: Generalized weakness due to pain  Mobility Bed Mobility Bed Mobility:  Not assessed (pt refused due to anticipated pain, plans to sleep in recliner at d/c) Transfers Transfers: Yes Sit to Stand: 6: Modified independent (Device/Increase time);With upper extremity assist;From chair/3-in-1 Stand to Sit: With upper extremity assist;6: Modified independent (Device/Increase time);To chair/3-in-1 Stand Pivot Transfers: 6: Modified independent (Device/Increase time) Stand Pivot Transfer Details (indicate cue type and reason): patient self electing to push up on RW despite cues for safe hand placement, reports he does not have enough leverage to push up from seated surface Locomotion  Ambulation Ambulation: Yes Ambulation/Gait Assistance: 6: Modified independent (Device/Increase time) Ambulation Distance (Feet): 150 Feet Assistive device: Rolling walker Gait Gait: Yes Gait Pattern: Impaired Gait Pattern: Decreased hip/knee flexion - left;Step-through pattern;Decreased stance time - left;Decreased step length - left;Decreased weight shift to left;Trunk flexed;Narrow base of support Stairs / Additional Locomotion Stairs: Yes Stairs Assistance: 5: Supervision Stairs Assistance Details: Verbal cues for precautions/safety Stair Management Technique: One rail Right;Forwards;Step to pattern Number of Stairs: 5 Height of Stairs: 6 Wheelchair Mobility Wheelchair Mobility: No (pt ambulatory)  Trunk/Postural Assessment  Cervical Assessment Cervical Assessment: Within Functional Limits Thoracic Assessment Thoracic Assessment: Within Functional Limits Lumbar Assessment Lumbar Assessment: Exceptions to [facility] (Pt with limited lumbar movement secondary to increased pain.) Postural Control Postural Control:  Deficits on evaluation Postural Limitations: Pt keeps weight shifted to the right in standing secondary to left hip/pelvis pain.  Balance Balance Balance Assessed: Yes Static Sitting Balance Static Sitting - Balance Support: Feet supported;No upper extremity supported Static Sitting - Level of Assistance: 7: Independent Dynamic Sitting Balance Dynamic Sitting - Balance Support: Feet supported;No upper extremity supported Dynamic Sitting - Level of Assistance: 6: Modified independent (Device/Increase time) Static Standing Balance Static Standing - Balance Support: No upper extremity supported Static Standing - Level of Assistance: 6: Modified independent (Device/Increase time) Dynamic Standing Balance Dynamic Standing - Balance Support: Right upper extremity supported;Left upper extremity supported Dynamic Standing - Level of Assistance: 6: Modified independent (Device/Increase time) Extremity Assessment   RLE Assessment RLE Assessment: Within Functional Limits LLE Assessment LLE Assessment: Exceptions to [facility] LLE Strength LLE Overall Strength: Deficits;Due to pain LLE Overall Strength Comments: hip flexion 1/5, knee flexion/extension 5/5, ankle DF/PF 5/5  See FIM for current functional status  Laretta Alstrom 03/14/2014, 11:41 AM

## 2014-03-14 NOTE — Discharge Summary (Signed)
NAMEVITALIY, EISENHOUR NO.:  [id number]  MEDICAL RECORD NO.:  [id number]  LOCATION:                                 FACILITY:  PHYSICIAN:  Charlett Blake, M.D.DATE OF BIRTH:  01/11/1956  DATE OF ADMISSION:  03/08/2014 DATE OF DISCHARGE:  03/14/2014                              DISCHARGE SUMMARY   DISCHARGE DIAGNOSES: 1. Functional deficits secondary to polytrauma after a fall from a     ladder including a left iliac wing fracture, left superior and     inferior pubic rami fracture, left rib fracture, and clavicle     fracture.  Subcutaneous Lovenox for DVT prophylaxis. 2. Pain management. 3. Urinary retention, resolved. 4. Constipation, resolved.  HISTORY OF PRESENT ILLNESS:  This is a 58 year old right-handed male admitted on March 04, 2014, after a fall from a roof approximately 10 feet with questionable loss of consciousness.  He was independent prior to admission living with his wife.  Complaints of left shoulder and chest pain as well as left hip pain.  Cranial CT scan showed a high left parietal scalp hematoma without skull fracture or intracranial abnormality.  CT of the abdomen and pelvis showed a left iliac wing fracture, left superior-inferior pubic rami fracture as well as retroperitoneal hematoma and small left pneumothorax, left first rib fracture and left clavicle fracture.  Followup orthopedic service, Dr. Marcelino Scot.  Currently weightbearing as tolerated, left lower extremity. Shoulder sling in place for clavicle fracture.  A 3D CT of the pelvis showed multiple fractures involving left iliac wing remaining external to the left acetabulum extending in a nondisplaced fracture plane into the left SI joint, as well as fractures of left superior-inferior pubic rami.  HOSPITAL COURSE:  Pain management.  Subcutaneous Lovenox for DVT prophylaxis.  Bouts of urinary retention monitored.  His mobility improved.  He had been placed on Urecholine.   Physical and occupational therapy ongoing.  The patient was admitted for comprehensive rehab program.  PAST MEDICAL HISTORY:  See discharge diagnoses.  SOCIAL HISTORY:  Lives with spouse.  FUNCTIONAL HISTORY:  Prior to admission, independent, works as a Programme researcher, broadcasting/film/video.  Functional status upon admission to rehab service was +2 physical assist, stand pivot transfers; +2 physical assist, sit to stand; min to mod assist, activities of daily living.  PHYSICAL EXAMINATION:  VITAL SIGNS:  Blood pressure 126/73, pulse 60, temperature 98, respirations 18. GENERAL:  This was an alert male, in no acute distress.  Cooperative with exam. LUNGS:  Clear to auscultation. CARDIAC:  Regular rate and rhythm. ABDOMEN:  Soft, nontender.  Good bowel sounds.  REHABILITATION HOSPITAL COURSE:  The patient was admitted to inpatient rehab services with therapies initiated on a 3-hour daily basis consisting of physical therapy, occupational therapy, and rehabilitation nursing.  The following issues were addressed during the patient's rehabilitation stay.  Pertaining to Mr. Coin multi-trauma after a fall including left iliac wing fracture, left superior-inferior pubic rami fracture, left rib fracture and clavicle fracture, remained stable. Neurovascular sensation intact.  He would follow up with Orthopedic Services, Dr. Altamese Faith.  He was weightbearing as tolerated.  He did wear a shoulder sling, left  upper extremity. Subcutaneous Lovenox for DVT prophylaxis.  No bleeding episodes noted.  Venous Doppler study showed no signs of DVT.  Pain managed with use of scheduled OxyContin sustained release 10 mg every 12 hours,  as well as Robaxin and oxycodone for breakthrough pain.  Bouts of urinary retention continued to improve.  His mobility improved.  He was weaned from his Urecholine. He did remain on Flomax.  He denied any dysuria or hematuria.  The patient received weekly collaborative  interdisciplinary team conferences to discuss estimated length of stay, family teaching, and any barriers to his discharge.  He was ambulating 75 feet x1 with a rolling walker, supervision level.  He self propelled his wheelchair independently, negotiated stairs with handrail, supervision.  He was able to complete his activities of daily living needing some assistance for lower body dressing due to the clavicle fracture.  Full family teaching was completed.  Plan was to be discharged to home with his wife with home health, physical, and occupational therapy.  DISCHARGE MEDICATIONS: 1. At the time of dictation included Claritin 10 mg p.o. daily. 2. Robaxin 500 mg p.o. every 6 hours as needed for muscle spasms. 3. Oxycodone immediate release 10 mg p.o. every 4 hours as needed for     pain, dispense of 90 tablets. 4. OxyContin sustained release 10 mg p.o. every 12 hours and taper as     directed. 5. MiraLAX daily.  Hold for loose stools. 6. Flomax 0.4 mg p.o. daily.  DIET:  Regular.  SPECIAL INSTRUCTIONS:  Weightbearing as tolerated.  The patient will follow up Dr. Alysia Penna at the outpatient rehab center as needed. Dr. Altamese [place], Orthopedic Services, call for appointment.  Dr. Shanda Bumps of [address], [place], [place], Conservation officer, nature.     Lauraine Rinne, P.A.   ______________________________ Charlett Blake, M.D.    DA/MEDQ  D:  03/13/2014  T:  03/13/2014  Job:  [id number]  cc:   Bradly Chris, MD Astrid Divine. Marcelino Scot, M.D.

## 2014-03-14 NOTE — Progress Notes (Addendum)
Subjective/Complaints:  Still with constipation and bladder hesitency  Objective: Vital Signs: Blood pressure 127/70, pulse 63, temperature 98.4 F (36.9 C), temperature source Oral, resp. rate 18, height 5\' 11"  (1.803 m), weight 83.371 kg (183 lb 12.8 oz), SpO2 95 %. Dg Pelvis Comp Min 3v  03/12/2014   CLINICAL DATA:  Pelvic fractures, left hip pain  EXAM: JUDET PELVIS - 3+ VIEW  COMPARISON:  03/07/2014  FINDINGS: Three Judet views of the pelvic submitted. Again noted comminuted fractures of the left ilium. Stable from prior exam. Again noted fracture of left superior and inferior pubic ramus. Again noted fracture along of medial acetabulum breaching the ileal pubic line. Mild displaced iliac bone fracture superolateral to acetabulum again noted. Second fracture line in lateral iliac wing is stable. SI joints are symmetrical in appearance. No change in alignment to pubic symphysis.  IMPRESSION: Again noted comminuted left hemi pelvic fractures as described above. No significant change from prior exam.   Electronically Signed   By: Lahoma Crocker M.D.   On: 03/12/2014 10:30   No results found for this or any previous visit (from the past 72 hour(s)).   HEENT: normal Cardio: RRR Resp: CTA B/L and unlabored GI: BS positive and NT, ND Extremity:  No Edema Skin:   Other pruritis buttocks Neuro: Alert/Oriented, Normal Motor on Right and Abnormal Motor 2- Left delt, 4- BI, tri, grip, 2- L HF, 3- KE, 4/5 L ank;e DF/PF Musc/Skel:  Limited ROM Left Hip and Shoulder joints Gen NAD,    Assessment/Plan: 1. Functional deficits secondary to multitrauma after a fall from a ladder including left iliac wing fracture, left superior and inferior pubic rami fractures, left rib and clavicle fracture   Stable for D/C today F/u PCP in 1-2 weeks F/u ortho 3 weeks See D/C summary See D/C instructions FIM: FIM - Bathing Bathing Steps Patient Completed: Left lower leg (including foot), Right lower leg  (including foot), Left upper leg, Right upper leg, Buttocks, Front perineal area, Abdomen, Left Arm, Right Arm, Chest Bathing: 5: Set-up assist to: Obtain items  FIM - Upper Body Dressing/Undressing Upper body dressing/undressing steps patient completed: Thread/unthread right sleeve of pullover shirt/dresss, Thread/unthread left sleeve of pullover shirt/dress, Put head through opening of pull over shirt/dress, Pull shirt over trunk Upper body dressing/undressing: 5: Supervision: Safety issues/verbal cues FIM - Lower Body Dressing/Undressing Lower body dressing/undressing steps patient completed: Fasten/unfasten pants, Pull pants up/down, Thread/unthread left pants leg, Thread/unthread right pants leg, Pull underwear up/down, Thread/unthread left underwear leg, Thread/unthread right underwear leg, Don/Doff right sock, Don/Doff left sock Lower body dressing/undressing: 5: Set-up assist to: Don/Doff TED stocking  FIM - Toileting Toileting steps completed by patient: Adjust clothing prior to toileting, Performs perineal hygiene, Adjust clothing after toileting Toileting Assistive Devices: Grab bar or rail for support Toileting: 6: More than reasonable amount of time  FIM - Radio producer Devices: Bedside commode, Environmental consultant, Grab bars Toilet Transfers: 5-To toilet/BSC: Supervision (verbal cues/safety issues), 5-From toilet/BSC: Supervision (verbal cues/safety issues)  FIM - Control and instrumentation engineer Devices: Copy: 5: Bed > Chair or W/C: Supervision (verbal cues/safety issues), 5: Chair or W/C > Bed: Supervision (verbal cues/safety issues)  FIM - Locomotion: Wheelchair Distance: 50 Locomotion: Wheelchair: 2: Travels 50 - 149 ft with supervision, cueing or coaxing FIM - Locomotion: Ambulation Locomotion: Ambulation Assistive Devices: Administrator Ambulation/Gait Assistance: 5: Supervision Locomotion: Ambulation: 5: Travels  150 ft or more with supervision/safety issues  Comprehension Comprehension Mode:  Auditory Comprehension: 7-Follows complex conversation/direction: With no assist  Expression Expression Mode: Verbal Expression: 7-Expresses complex ideas: With no assist  Social Interaction Social Interaction: 7-Interacts appropriately with others - No medications needed.  Problem Solving Problem Solving: 7-Solves complex problems: Recognizes & self-corrects  Memory Memory: 7-Complete Independence: No helper  Medical Problem List and Plan: 1. Functional deficits secondary to multitrauma after a fall from a ladder including left iliac wing fracture, left superior and inferior pubic rami fractures, left rib and clavicle fracture   -reviewed pelvic xrays with patient---no changes 2. DVT Prophylaxis/Anticoagulation: Subcutaneous Lovenox. Monitor platelet counts any signs of bleeding 3. Pain Management: Oxycodone and Robaxin as needed.   4. Urinary retention. Continue your choline and Flomax as directed. Check PVRs 3. Monitor with increased mobility. Follow-up urine study 5. Neuropsych: This patient is capable of making decisions on his own behalf. 6. Skin/Wound Care: Routine skin checks 7. Fluids/Electrolytes/Nutrition: Strict I and O's. Follow-up chemistries. Provide nutritional supplements as needed 8.  Constipation laxatives adjusted. BMs with supp or enema LOS (Days) 6 A FACE TO FACE EVALUATION WAS PERFORMED  Lorna Strother E 03/14/2014, 7:18 AM

## 2014-03-14 NOTE — Plan of Care (Signed)
Problem: RH BOWEL ELIMINATION Goal: RH STG MANAGE BOWEL WITH ASSISTANCE STG Manage Bowel with min. Assistance.  Outcome: Progressing Goal: RH STG MANAGE BOWEL W/MEDICATION W/ASSISTANCE STG Manage Bowel with Medication with min. Assistance.  Outcome: Progressing  Problem: RH BLADDER ELIMINATION Goal: RH STG MANAGE BLADDER WITH MEDICATION WITH ASSISTANCE STG Manage Bladder With Medication With min. Assistance.  Outcome: Progressing  Problem: RH SKIN INTEGRITY Goal: RH STG SKIN FREE OF INFECTION/BREAKDOWN With min.assist  Outcome: Progressing Goal: RH STG MAINTAIN SKIN INTEGRITY WITH ASSISTANCE STG Maintain Skin Integrity With min.Assistance.  Outcome: Progressing  Problem: RH SAFETY Goal: RH STG ADHERE TO SAFETY PRECAUTIONS W/ASSISTANCE/DEVICE STG Adhere to Safety Precautions With mod. Assistance/Device.  Outcome: Progressing Goal: RH STG DECREASED RISK OF FALL WITH ASSISTANCE STG Decreased Risk of Fall With Assistance.  Outcome: Progressing Goal: RH STG DEMO UNDERSTANDING HOME SAFETY PRECAUTIONS Outcome: Progressing

## 2014-03-14 NOTE — Progress Notes (Signed)
Occupational Therapy Discharge Summary and Skilled OT Intervention Sessions  Patient Details  Name: Roger Wilson MRN: [id number] Date of Birth: Sep 27, 1955  Today's Date: 03/14/2014 OT Individual Time: 7829-5621 and 3086 - 5784 OT Individual Time Calculation (min): 60 min and 60 min   Patient has met 9 of 9 long term goals due to improved activity tolerance, improved balance, ability to compensate for deficits and improved coordination.  Patient to discharge at overall Modified Independent level.  Patient's wife has received family education during skilled therapy sessions.  Reasons goals not met:All goals met.  Recommendation:  Patient will has met all OT goals with modified independence during rehabilitation stay with no further OT recommended.   Equipment: BSC, LH reacher, LH sponge, LH shoe horn, and sock aide.  Reasons for discharge: treatment goals met  Patient/family agrees with progress made and goals achieved: Yes   OT Interventions: Session 1: Upon entering the room, pt with 8/10 c/o pain in L hip. RN provided pain medication during session. Pt ambulated to restroom for toileting with Mod I and use of RW. Pt performed shower transfer onto TTB with Mod I and use of RW with grab bars. Pt performed all self care this session with Mod I and use of RW and AE (LH sponge,LH reacher, LH shoe horn, and sock aide). Dressing performed seated in wheelchair at sink side. Pt with no additional concerns or questions this session. Pt seated in recliner chair with call Farish and all other needed items within reach.  Session 2: Upon entering the room, pt with 7/10 c/o pain in L hip. RN provided pain medication during session. Patient's wife present to continue family education and answer any questions regarding concerns or discharge. Pt propelled wheelchair onto elevator downstairs in order to engage in community mobility task. Pt ambulated up and down ramp ~ 80 feet with RW on brick and asphalt  with no issues. OT discussed pt goal status, no further need for OT services, and answered questions regarding home environment with pt and wife verbalizing understanding. OT educated pt/fam on fall risks within home as well. Pt returned to room by propelling chair with B UEs and seated in wheelchair with all needed items within reach upon exiting the room.   OT Discharge Precautions/Restrictions  Precautions Precautions: Fall Restrictions Weight Bearing Restrictions: Yes LUE Weight Bearing: Weight bearing as tolerated LLE Weight Bearing: Weight bearing as tolerated Pain Pain Assessment Pain Assessment: 0-10 Pain Score: 7  Pain Type: Acute pain Pain Location: Hip Pain Orientation: Left Pain Descriptors / Indicators: Aching Patients Stated Pain Goal: 4 Pain Intervention(s): Medication (See eMAR);RN made aware;Repositioned Multiple Pain Sites: No Vision/Perception  Vision- History Baseline Vision/History: Wears glasses Patient Visual Report: No change from baseline  Cognition Overall Cognitive Status: Within Functional Limits for tasks assessed Orientation Level: Oriented X4 Attention: Sustained;Selective Sustained Attention: Appears intact Selective Attention: Appears intact Memory: Appears intact Awareness: Appears intact Problem Solving: Appears intact Safety/Judgment: Appears intact Sensation Sensation Light Touch: Impaired by gross assessment Stereognosis: Appears Intact Hot/Cold: Appears Intact Proprioception: Appears Intact Additional Comments: pt reports numbness in L hip joint Coordination Gross Motor Movements are Fluid and Coordinated: No Fine Motor Movements are Fluid and Coordinated: Yes Coordination and Movement Description: Limited by L LE pain Motor  Motor Motor - Discharge Observations: Generalized weakness due to L hip pain Mobility  Bed Mobility Bed Mobility: Not assessed (pt currently sleeping in recliner chair secondary to  pain) Transfers Transfers: Sit to Stand Sit to Stand:  6: Modified independent (Device/Increase time);With upper extremity assist;From chair/3-in-1 Stand to Sit: With upper extremity assist;6: Modified independent (Device/Increase time);To chair/3-in-1  Trunk/Postural Assessment  Cervical Assessment Cervical Assessment: Within Functional Limits Thoracic Assessment Thoracic Assessment: Within Functional Limits Lumbar Assessment Lumbar Assessment: Exceptions to [facility] Postural Control Postural Control: Deficits on evaluation Postural Limitations: Pt not placing full weight onto L LE secondary to anticipated pain  Balance Balance Balance Assessed: Yes Static Sitting Balance Static Sitting - Balance Support: Feet supported;No upper extremity supported Static Sitting - Level of Assistance: 7: Independent Dynamic Standing Balance Dynamic Standing - Balance Support: Right upper extremity supported;Left upper extremity supported Dynamic Standing - Level of Assistance: 6: Modified independent (Device/Increase time) Dynamic Standing - Balance Activities: Reaching for objects;Other (comment) Dynamic Standing - Comments: during B & D session Extremity/Trunk Assessment RUE Assessment RUE Assessment: Within Functional Limits LUE Assessment LUE Assessment: Within Functional Limits  See FIM for current functional status  Phineas Semen 03/14/2014, 12:41 PM

## 2014-03-14 NOTE — Discharge Instructions (Signed)
Inpatient Rehab Discharge Instructions  Fender Herder Discharge date and time: 03/14/14   Activities/Precautions/ Functional Status: Activity: Weightbearing as tolerated lower extremities. Shoulder sling left upper extremity Diet: regular diet Wound Care:  Functional status:  ___ No restrictions     ___ Walk up steps independently ___ 24/7 supervision/assistance   ___ Walk up steps with assistance _X__ Intermittent supervision/assistance  ___ Bathe/dress independently ___ Walk with walker     ___ Bathe/dress with assistance _x__ Walk Independently    _X_ Shower independently ___ Walk with assistance    ___ Shower with assistance _X__ No alcohol     ___ Return to work/school ________  [place]:   PT     OT     Agency:  [facility] Phone:  [phone]   They should call you no later than Monday to arrange home visit.  Medical Equipment/Items Ordered:  Rolling walker, beside commode and wheelchair/ cushion  Agency/Supplier:  [facility]        Phone:  [phone]   Special Instructions:    My questions have been answered and I understand these instructions. I will adhere to these goals and the provided educational materials after my discharge from the hospital.  Patient/Caregiver Signature _______________________________ Date __________  Clinician Signature _______________________________________ Date __________  Please bring this form and your medication list with you to all your follow-up doctor's appointments.

## 2014-03-14 NOTE — Progress Notes (Signed)
Patient ID: Roger Wilson, male   DOB: Aug 26, 1955, 58 y.o.   MRN: [id number] Patient was due for a Fleet enema before discharge.  When entering patients room, patient not there. Patient was taken to car and driven home by wife without informing staff.

## 2014-03-18 NOTE — Progress Notes (Signed)
Social Work Discharge Note  The overall goal for the admission was met for:   Discharge location: Yes - home with his wife  Length of Stay: Yes - 6 days  Discharge activity level: Yes - overall modified independent with some supervision/min assist needed at times  Home/community participation: Yes  Services provided included: MD, RD, PT, OT, RN, Pharmacy and SW  Financial Services: Private Insurance: Roger Wilson  Follow-up services arranged: Home Health: PT and OT from [facility] and DME: walker, bedside commode, and 18x18 w/c from [facility] (or additional information):  Pt to be overall modified independent, but some tasks will require supervision or minimal assistance.  Pt's wife received family education and will be able to meet pt's needs.  Patient/Family verbalized understanding of follow-up arrangements: Yes  Individual responsible for coordination of the follow-up plan: pt and his wife  Confirmed correct DME delivered: Trey Sailors 03/18/2014    Laurey Salser, Silvestre Mesi

## 2014-03-19 ENCOUNTER — Telehealth: Payer: Self-pay | Admitting: *Deleted

## 2014-03-19 NOTE — Telephone Encounter (Signed)
Calling to get verbal order for HHPT 2wk1, 1wk2.  Verbal approval given per protocol.

## 2014-05-19 ENCOUNTER — Encounter: Payer: Self-pay | Admitting: Gastroenterology

## 2015-07-20 DIAGNOSIS — S83512D Sprain of anterior cruciate ligament of left knee, subsequent encounter: Secondary | ICD-10-CM | POA: Diagnosis not present

## 2015-07-22 DIAGNOSIS — S83512D Sprain of anterior cruciate ligament of left knee, subsequent encounter: Secondary | ICD-10-CM | POA: Diagnosis not present

## 2015-07-27 DIAGNOSIS — S83512D Sprain of anterior cruciate ligament of left knee, subsequent encounter: Secondary | ICD-10-CM | POA: Diagnosis not present

## 2015-07-29 DIAGNOSIS — S83512D Sprain of anterior cruciate ligament of left knee, subsequent encounter: Secondary | ICD-10-CM | POA: Diagnosis not present

## 2015-08-03 DIAGNOSIS — S83512D Sprain of anterior cruciate ligament of left knee, subsequent encounter: Secondary | ICD-10-CM | POA: Diagnosis not present

## 2015-08-05 DIAGNOSIS — S83512D Sprain of anterior cruciate ligament of left knee, subsequent encounter: Secondary | ICD-10-CM | POA: Diagnosis not present

## 2015-08-10 DIAGNOSIS — S83512D Sprain of anterior cruciate ligament of left knee, subsequent encounter: Secondary | ICD-10-CM | POA: Diagnosis not present

## 2015-08-12 DIAGNOSIS — S83512D Sprain of anterior cruciate ligament of left knee, subsequent encounter: Secondary | ICD-10-CM | POA: Diagnosis not present

## 2015-08-17 DIAGNOSIS — S83512D Sprain of anterior cruciate ligament of left knee, subsequent encounter: Secondary | ICD-10-CM | POA: Diagnosis not present

## 2015-08-19 DIAGNOSIS — S83512D Sprain of anterior cruciate ligament of left knee, subsequent encounter: Secondary | ICD-10-CM | POA: Diagnosis not present

## 2015-08-24 DIAGNOSIS — S83512D Sprain of anterior cruciate ligament of left knee, subsequent encounter: Secondary | ICD-10-CM | POA: Diagnosis not present

## 2015-08-26 DIAGNOSIS — S83512D Sprain of anterior cruciate ligament of left knee, subsequent encounter: Secondary | ICD-10-CM | POA: Diagnosis not present

## 2015-08-31 DIAGNOSIS — S83512D Sprain of anterior cruciate ligament of left knee, subsequent encounter: Secondary | ICD-10-CM | POA: Diagnosis not present

## 2015-09-02 DIAGNOSIS — S83512D Sprain of anterior cruciate ligament of left knee, subsequent encounter: Secondary | ICD-10-CM | POA: Diagnosis not present

## 2015-09-07 DIAGNOSIS — S83512D Sprain of anterior cruciate ligament of left knee, subsequent encounter: Secondary | ICD-10-CM | POA: Diagnosis not present

## 2015-09-09 DIAGNOSIS — S83512D Sprain of anterior cruciate ligament of left knee, subsequent encounter: Secondary | ICD-10-CM | POA: Diagnosis not present

## 2015-09-15 DIAGNOSIS — S83512D Sprain of anterior cruciate ligament of left knee, subsequent encounter: Secondary | ICD-10-CM | POA: Diagnosis not present

## 2015-09-17 DIAGNOSIS — S83512D Sprain of anterior cruciate ligament of left knee, subsequent encounter: Secondary | ICD-10-CM | POA: Diagnosis not present

## 2015-09-21 DIAGNOSIS — S83512D Sprain of anterior cruciate ligament of left knee, subsequent encounter: Secondary | ICD-10-CM | POA: Diagnosis not present

## 2015-09-23 DIAGNOSIS — S83512D Sprain of anterior cruciate ligament of left knee, subsequent encounter: Secondary | ICD-10-CM | POA: Diagnosis not present

## 2015-09-30 DIAGNOSIS — S83512D Sprain of anterior cruciate ligament of left knee, subsequent encounter: Secondary | ICD-10-CM | POA: Diagnosis not present

## 2015-10-02 DIAGNOSIS — S83512D Sprain of anterior cruciate ligament of left knee, subsequent encounter: Secondary | ICD-10-CM | POA: Diagnosis not present

## 2015-10-05 DIAGNOSIS — S83512D Sprain of anterior cruciate ligament of left knee, subsequent encounter: Secondary | ICD-10-CM | POA: Diagnosis not present

## 2015-10-07 DIAGNOSIS — S83512D Sprain of anterior cruciate ligament of left knee, subsequent encounter: Secondary | ICD-10-CM | POA: Diagnosis not present

## 2015-10-16 DIAGNOSIS — S83512D Sprain of anterior cruciate ligament of left knee, subsequent encounter: Secondary | ICD-10-CM | POA: Diagnosis not present

## 2015-10-21 DIAGNOSIS — S83512D Sprain of anterior cruciate ligament of left knee, subsequent encounter: Secondary | ICD-10-CM | POA: Diagnosis not present

## 2015-10-23 DIAGNOSIS — S83512D Sprain of anterior cruciate ligament of left knee, subsequent encounter: Secondary | ICD-10-CM | POA: Diagnosis not present

## 2015-10-28 DIAGNOSIS — S83512D Sprain of anterior cruciate ligament of left knee, subsequent encounter: Secondary | ICD-10-CM | POA: Diagnosis not present

## 2015-11-05 DIAGNOSIS — S83512D Sprain of anterior cruciate ligament of left knee, subsequent encounter: Secondary | ICD-10-CM | POA: Diagnosis not present

## 2016-02-01 DIAGNOSIS — S83412A Sprain of medial collateral ligament of left knee, initial encounter: Secondary | ICD-10-CM | POA: Diagnosis not present

## 2016-02-12 DIAGNOSIS — Z0000 Encounter for general adult medical examination without abnormal findings: Secondary | ICD-10-CM | POA: Diagnosis not present

## 2016-02-12 DIAGNOSIS — Z125 Encounter for screening for malignant neoplasm of prostate: Secondary | ICD-10-CM | POA: Diagnosis not present

## 2016-02-16 IMAGING — CT CT CHEST W/ CM
2 of 5 series · 14 of 36 positions shown, 17 images · IV contrast (Omni 300)
Comparison: CT scan 12/21/2011.

CLINICAL DATA: Fell off a roof. Pelvic fractures and left-sided
pneumothorax.

EXAM:
CT CHEST, ABDOMEN, AND PELVIS WITH CONTRAST
TECHNIQUE: Multidetector CT imaging of the chest, abdomen and pelvis was
performed following the standard protocol during bolus
administration of intravenous contrast.
CONTRAST:  80mL OMNIPAQUE IOHEXOL 300 MG/ML  SOLN

[Series 2: cap 5.0 i31f 1 · axial · 0.72mm/px · z∈[-980,-400]mm · 11 of 134 slices shown, 14 images]
[im 9/134  mediastinal]
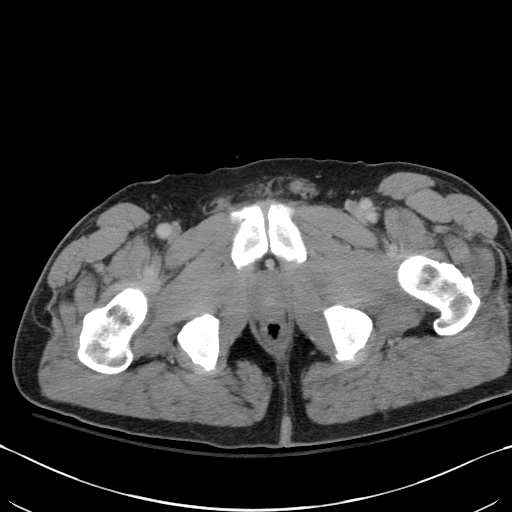
[im 9/134  lung]
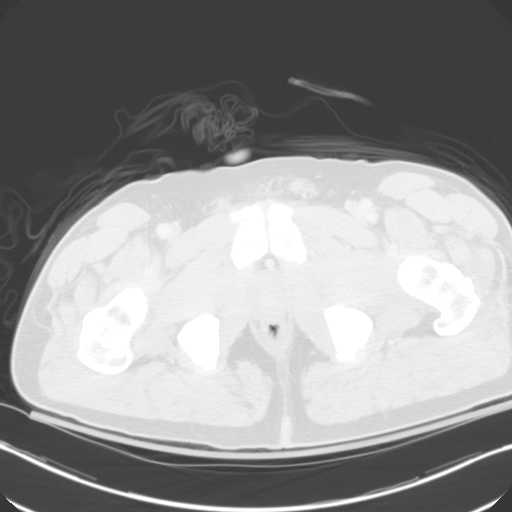
[im 25/134  lung]
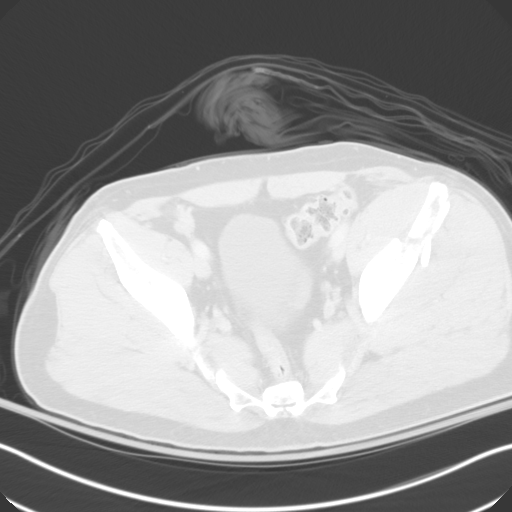
[im 34/134  lung]
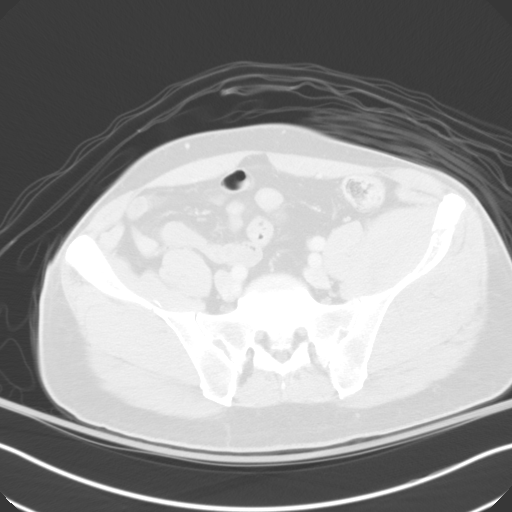
[im 42/134  lung]
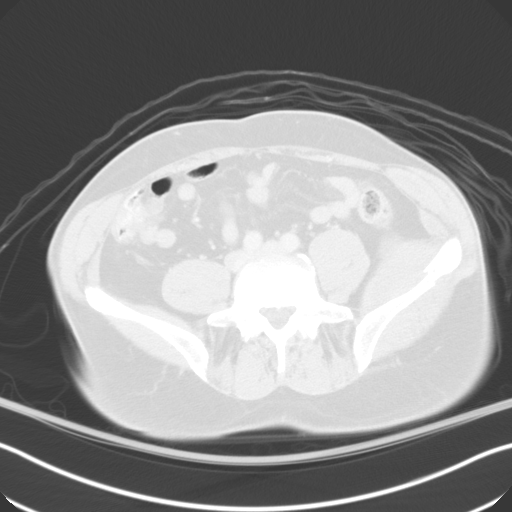
[im 59/134  mediastinal]
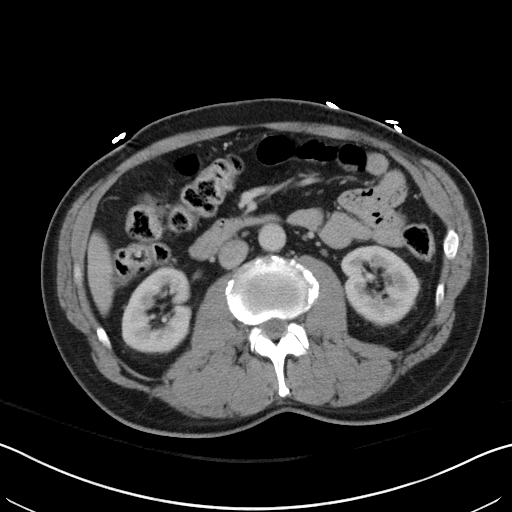
[im 59/134  lung]
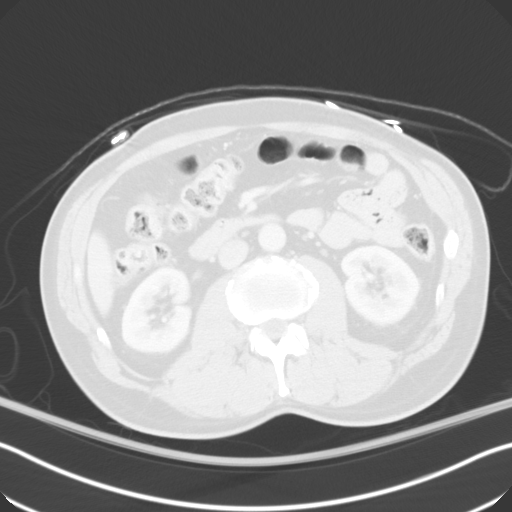
[im 67/134  lung]
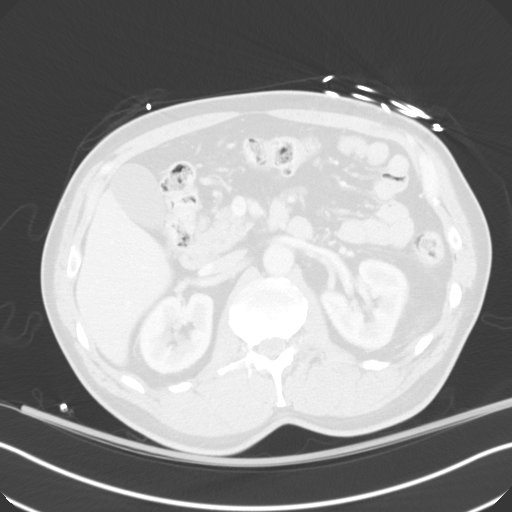
[im 75/134  lung]
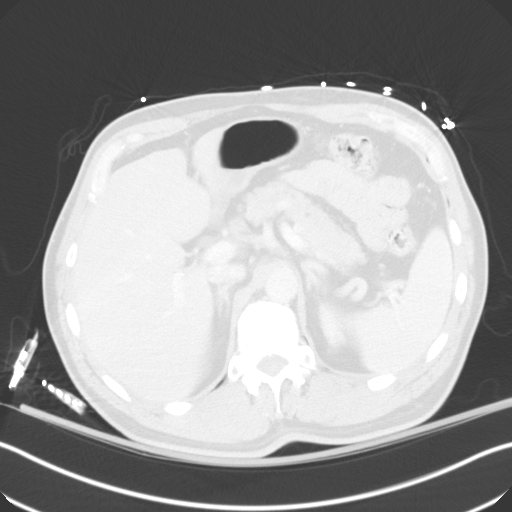
[im 92/134  lung]
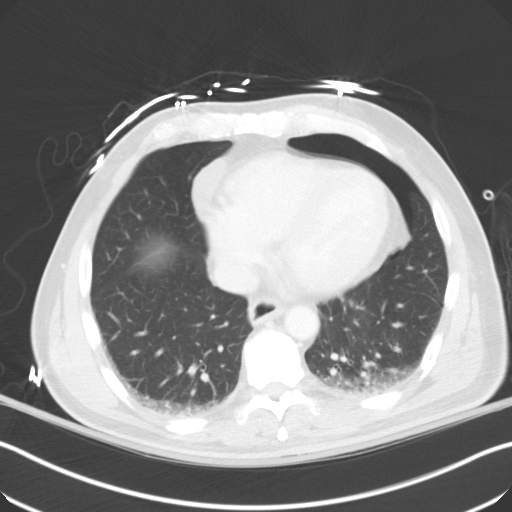
[im 100/134  mediastinal]
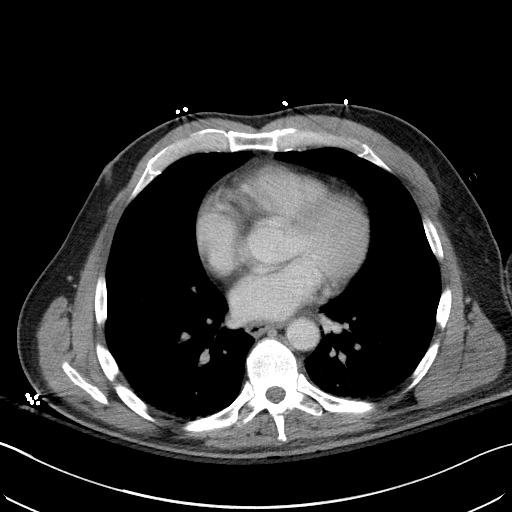
[im 100/134  lung]
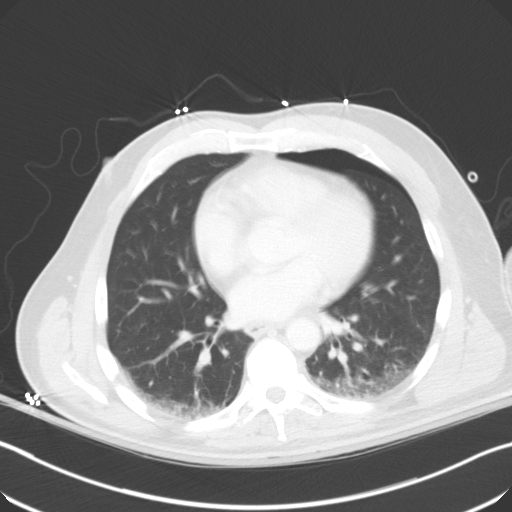
[im 109/134  lung]
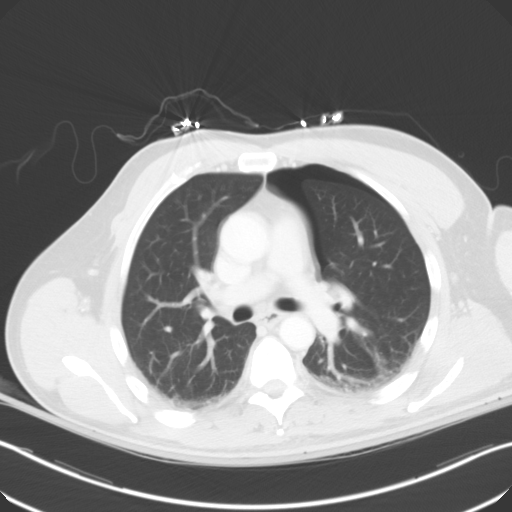
[im 125/134  lung]
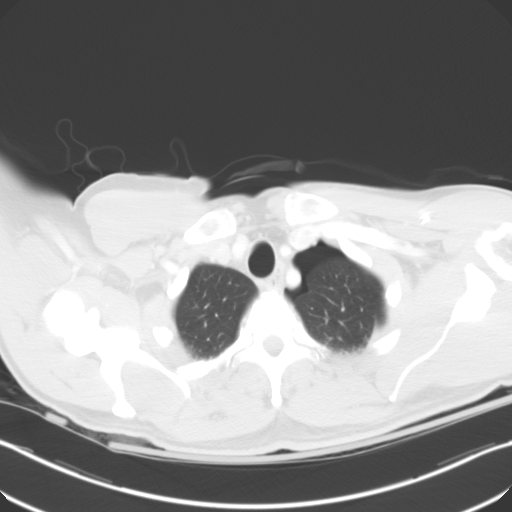

[Series 6: coronal · coronal · 1.02mm/px · 3 of 100 slices shown]
[im 20/100  lung]
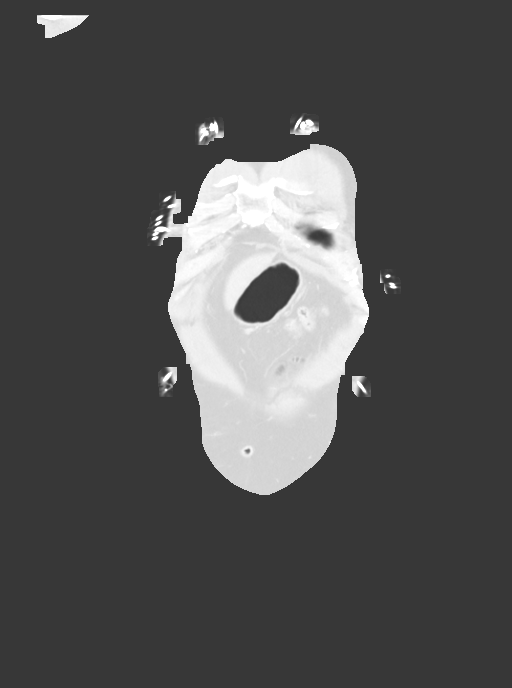
[im 40/100  lung]
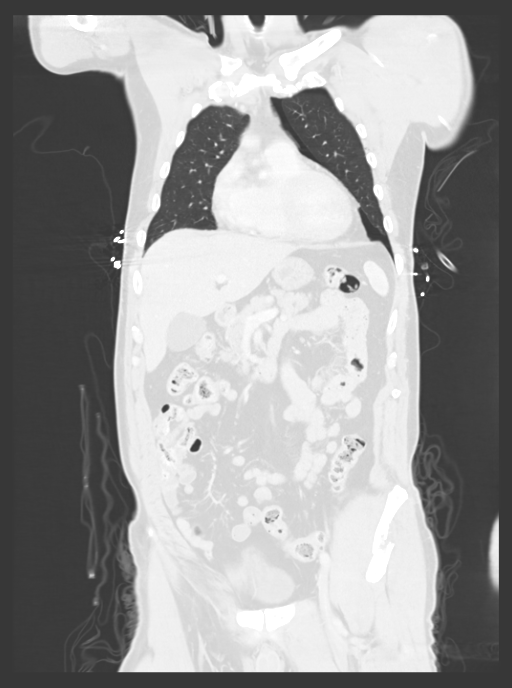
[im 60/100  lung]
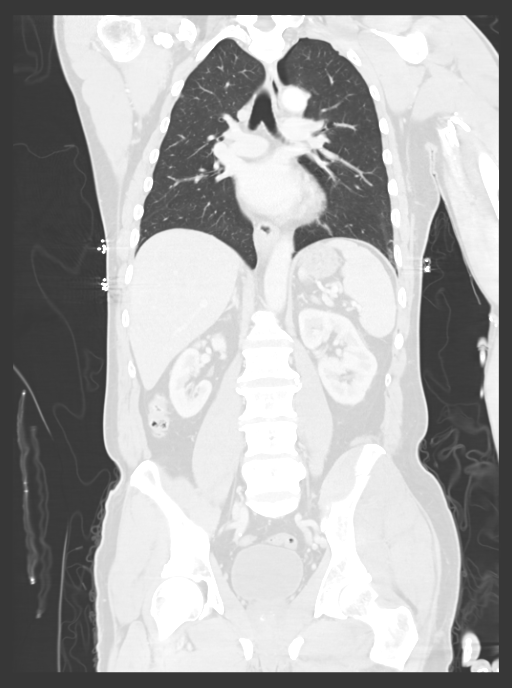

[14 of 36 positions shown; findings below may reference images not displayed]

FINDINGS: CT CHEST FINDINGS

Chest wall: There is a distal left clavicle fracture noted. The
sternum is intact. No thoracic vertebral body fractures are
identified. There are normally aligned. Suspect a subtle
nondisplaced fracture involving the anterior aspect of the first
left rib. This is best seen on the sagittal reformatted images. On
the right side there appears to be a small nondisplaced fracture
near the first rib articulation with the sternum. There is an
associated small substernal hematoma.

Mediastinum: The heart is normal in size. No pericardial effusion.
There is a stable benign epicardial cyst on the right side. The
aorta and branch vessels are patent. No dissection. No mediastinal
hematoma, mass or adenopathy. The esophagus is grossly normal.

Lungs: There is a small left apical pneumothorax. Dependent
bibasilar atelectasis but no pulmonary contusions. No pleural
effusion.

CT ABDOMEN AND PELVIS FINDINGS

The solid abdominal organs are intact. No acute injury is
identified. The gallbladder is normal. No common bowel duct
dilatation. No mesenteric or retroperitoneal mass or hematoma.

The stomach, duodenum, small bowel and colon are grossly normal
without oral contrast. No free air or free fluid.

The bladder, prostate gland and seminal vesicles are normal. No
pelvic mass or intrapelvic hematoma or fluid.

There is a comminuted fracture involving the left iliac wing
extending all the way down to just above the superior and anterior
aspect of the acetabulum. It does not involve the acetabulum itself.
Both hips are normally located. The pubic symphysis and SI joints
are intact. There are superior and inferior pubic rami fractures on
the left side. There may be a small anterior sacral fracture on
image number 104. Associated hematoma is noted in the iliacus muscle
and gluteus minimus muscles.

The lumbar vertebral bodies are normally aligned. No transverse
process fractures.
IMPRESSION: 1. Small left-sided pneumothorax. No pulmonary contusion or
displaced rib fractures.
2. Distal left clavicle fracture and nondisplaced subtle first left
rib fracture anteriorly.
3. Suspect a small fracture near the first rib articulation with the
sternum with an associated small substernal hematoma.
4. Normal heart and great vessels.
5. Intact solid abdominal organs.
6. Comminuted fracture involving the left iliac wing. No involvement
of the left acetabulum or left SI joint.
7. Superior and inferior pubic rami fractures on the left. The pubic
symphysis is intact.
8. Moderate-sized hematoma involving the left iliacus muscle and
gluteus minimus muscle.

## 2016-02-19 DIAGNOSIS — Z0000 Encounter for general adult medical examination without abnormal findings: Secondary | ICD-10-CM | POA: Diagnosis not present

## 2016-02-19 DIAGNOSIS — J309 Allergic rhinitis, unspecified: Secondary | ICD-10-CM | POA: Diagnosis not present

## 2016-02-19 DIAGNOSIS — R030 Elevated blood-pressure reading, without diagnosis of hypertension: Secondary | ICD-10-CM | POA: Diagnosis not present

## 2016-02-19 DIAGNOSIS — M25562 Pain in left knee: Secondary | ICD-10-CM | POA: Diagnosis not present

## 2016-02-19 DIAGNOSIS — M2669 Other specified disorders of temporomandibular joint: Secondary | ICD-10-CM | POA: Diagnosis not present

## 2016-02-25 DIAGNOSIS — Z1212 Encounter for screening for malignant neoplasm of rectum: Secondary | ICD-10-CM | POA: Diagnosis not present

## 2016-03-28 DIAGNOSIS — R030 Elevated blood-pressure reading, without diagnosis of hypertension: Secondary | ICD-10-CM | POA: Diagnosis not present

## 2016-03-28 DIAGNOSIS — Z6826 Body mass index (BMI) 26.0-26.9, adult: Secondary | ICD-10-CM | POA: Diagnosis not present

## 2016-09-26 DIAGNOSIS — M545 Low back pain: Secondary | ICD-10-CM | POA: Diagnosis not present

## 2017-03-14 DIAGNOSIS — Z125 Encounter for screening for malignant neoplasm of prostate: Secondary | ICD-10-CM | POA: Diagnosis not present

## 2017-03-14 DIAGNOSIS — Z0000 Encounter for general adult medical examination without abnormal findings: Secondary | ICD-10-CM | POA: Diagnosis not present

## 2017-03-16 DIAGNOSIS — E7849 Other hyperlipidemia: Secondary | ICD-10-CM | POA: Diagnosis not present

## 2017-03-16 DIAGNOSIS — Z0000 Encounter for general adult medical examination without abnormal findings: Secondary | ICD-10-CM | POA: Diagnosis not present

## 2017-03-16 DIAGNOSIS — R030 Elevated blood-pressure reading, without diagnosis of hypertension: Secondary | ICD-10-CM | POA: Diagnosis not present

## 2017-03-16 DIAGNOSIS — J3089 Other allergic rhinitis: Secondary | ICD-10-CM | POA: Diagnosis not present

## 2017-03-16 DIAGNOSIS — D126 Benign neoplasm of colon, unspecified: Secondary | ICD-10-CM | POA: Diagnosis not present

## 2017-03-16 DIAGNOSIS — Z1389 Encounter for screening for other disorder: Secondary | ICD-10-CM | POA: Diagnosis not present

## 2017-03-17 DIAGNOSIS — Z1212 Encounter for screening for malignant neoplasm of rectum: Secondary | ICD-10-CM | POA: Diagnosis not present

## 2017-04-21 ENCOUNTER — Encounter: Payer: Self-pay | Admitting: Gastroenterology

## 2017-08-14 DIAGNOSIS — H43812 Vitreous degeneration, left eye: Secondary | ICD-10-CM | POA: Diagnosis not present

## 2017-09-18 DIAGNOSIS — R030 Elevated blood-pressure reading, without diagnosis of hypertension: Secondary | ICD-10-CM | POA: Diagnosis not present

## 2017-09-18 DIAGNOSIS — S80261A Insect bite (nonvenomous), right knee, initial encounter: Secondary | ICD-10-CM | POA: Diagnosis not present

## 2018-04-17 DIAGNOSIS — Z125 Encounter for screening for malignant neoplasm of prostate: Secondary | ICD-10-CM | POA: Diagnosis not present

## 2018-04-17 DIAGNOSIS — E7849 Other hyperlipidemia: Secondary | ICD-10-CM | POA: Diagnosis not present

## 2018-04-17 DIAGNOSIS — R030 Elevated blood-pressure reading, without diagnosis of hypertension: Secondary | ICD-10-CM | POA: Diagnosis not present

## 2018-04-17 DIAGNOSIS — Z0000 Encounter for general adult medical examination without abnormal findings: Secondary | ICD-10-CM | POA: Diagnosis not present

## 2018-04-20 DIAGNOSIS — D126 Benign neoplasm of colon, unspecified: Secondary | ICD-10-CM | POA: Diagnosis not present

## 2018-04-20 DIAGNOSIS — L989 Disorder of the skin and subcutaneous tissue, unspecified: Secondary | ICD-10-CM | POA: Diagnosis not present

## 2018-04-20 DIAGNOSIS — I10 Essential (primary) hypertension: Secondary | ICD-10-CM | POA: Diagnosis not present

## 2018-04-20 DIAGNOSIS — E7849 Other hyperlipidemia: Secondary | ICD-10-CM | POA: Diagnosis not present

## 2018-04-20 DIAGNOSIS — Z0000 Encounter for general adult medical examination without abnormal findings: Secondary | ICD-10-CM | POA: Diagnosis not present

## 2018-04-26 DIAGNOSIS — Z1212 Encounter for screening for malignant neoplasm of rectum: Secondary | ICD-10-CM | POA: Diagnosis not present

## 2018-05-09 ENCOUNTER — Other Ambulatory Visit: Payer: Self-pay | Admitting: Dermatology

## 2018-05-09 DIAGNOSIS — D485 Neoplasm of uncertain behavior of skin: Secondary | ICD-10-CM | POA: Diagnosis not present

## 2018-05-09 DIAGNOSIS — L570 Actinic keratosis: Secondary | ICD-10-CM | POA: Diagnosis not present

## 2018-05-10 ENCOUNTER — Encounter: Payer: Self-pay | Admitting: Gastroenterology

## 2018-05-14 ENCOUNTER — Encounter: Payer: Self-pay | Admitting: Gastroenterology

## 2018-05-18 ENCOUNTER — Ambulatory Visit (AMBULATORY_SURGERY_CENTER): Payer: Self-pay | Admitting: *Deleted

## 2018-05-18 ENCOUNTER — Encounter: Payer: Self-pay | Admitting: Gastroenterology

## 2018-05-18 VITALS — Ht 70.0 in | Wt 175.0 lb

## 2018-05-18 DIAGNOSIS — Z86010 Personal history of colonic polyps: Secondary | ICD-10-CM

## 2018-05-18 MED ORDER — PEG 3350-KCL-NA BICARB-NACL 420 G PO SOLR: 4000.0000 mL | Freq: Once | ORAL | 0 refills | Status: AC

## 2018-05-18 NOTE — Progress Notes (Signed)
No egg or soy allergy known to patient  No issues with past sedation with any surgeries  or procedures, no intubation problems  No diet pills per patient No home 02 use per patient  No blood thinners per patient  Pt denies issues with constipation  No A fib or A flutter  EMMI video sent to pt's e mail  Pt. declined 

## 2018-06-01 ENCOUNTER — Encounter: Payer: Self-pay | Admitting: Gastroenterology

## 2018-06-01 ENCOUNTER — Ambulatory Visit (AMBULATORY_SURGERY_CENTER): Payer: BLUE CROSS/BLUE SHIELD | Admitting: Gastroenterology

## 2018-06-01 VITALS — BP 142/86 | HR 53 | Temp 98.7°F | Resp 15 | Ht 70.0 in | Wt 175.0 lb

## 2018-06-01 DIAGNOSIS — D122 Benign neoplasm of ascending colon: Secondary | ICD-10-CM

## 2018-06-01 DIAGNOSIS — K635 Polyp of colon: Secondary | ICD-10-CM | POA: Diagnosis not present

## 2018-06-01 DIAGNOSIS — K5140 Inflammatory polyps of colon without complications: Secondary | ICD-10-CM

## 2018-06-01 DIAGNOSIS — K5730 Diverticulosis of large intestine without perforation or abscess without bleeding: Secondary | ICD-10-CM

## 2018-06-01 DIAGNOSIS — D124 Benign neoplasm of descending colon: Secondary | ICD-10-CM

## 2018-06-01 DIAGNOSIS — Z1211 Encounter for screening for malignant neoplasm of colon: Secondary | ICD-10-CM

## 2018-06-01 DIAGNOSIS — Z86010 Personal history of colonic polyps: Secondary | ICD-10-CM

## 2018-06-01 MED ORDER — SODIUM CHLORIDE 0.9 % IV SOLN: 500.0000 mL | Freq: Once | INTRAVENOUS | Status: AC

## 2018-06-01 NOTE — Progress Notes (Signed)
Called to room to assist during endoscopic procedure.  Patient ID and intended procedure confirmed with present staff. Received instructions for my participation in the procedure from the performing physician.  

## 2018-06-01 NOTE — Progress Notes (Signed)
Report given to PACU, vss 

## 2018-06-01 NOTE — Op Note (Signed)
[facility] Patient Name: Roger Wilson Procedure Date: 06/01/2018 1:46 PM MRN: [id number] Endoscopist: Milus Banister , MD Age: 63 Referring MD:  Date of Birth: Nov 06, 1955 Gender: Male Account #: [id number] Procedure:                Colonoscopy Indications:              High risk colon cancer surveillance: Personal                            history of colonic polyps; colonoscopy 2008 one                            subCM adenoma, colonoscopy 2013 two subCM adenomas Medicines:                Monitored Anesthesia Care Procedure:                Pre-Anesthesia Assessment:                           - Prior to the procedure, a History and Physical                            was performed, and patient medications and                            allergies were reviewed. The patient's tolerance of                            previous anesthesia was also reviewed. The risks                            and benefits of the procedure and the sedation                            options and risks were discussed with the patient.                            All questions were answered, and informed consent                            was obtained. Prior Anticoagulants: The patient has                            taken no previous anticoagulant or antiplatelet                            agents. ASA Grade Assessment: II - A patient with                            mild systemic disease. After reviewing the risks                            and benefits, the patient was deemed in  satisfactory condition to undergo the procedure.                           After obtaining informed consent, the colonoscope                            was passed under direct vision. Throughout the                            procedure, the patient's blood pressure, pulse, and                            oxygen saturations were monitored continuously. The                            Colonoscope was  introduced through the anus and                            advanced to the the cecum, identified by                            appendiceal orifice and ileocecal valve. The                            colonoscopy was performed without difficulty. The                            patient tolerated the procedure well. The quality                            of the bowel preparation was good. The ileocecal                            valve, appendiceal orifice, and rectum were                            photographed. Scope In: 1:53:18 PM Scope Out: 2:04:49 PM Scope Withdrawal Time: 0 hours 9 minutes 19 seconds  Total Procedure Duration: 0 hours 11 minutes 31 seconds  Findings:                 Two sessile polyps were found in the descending                            colon and ascending colon. The polyps were 2 to 7                            mm in size. These polyps were removed with a cold                            snare. Resection and retrieval were complete.                           Scattered small-mouthed diverticula were found in  the left colon.                           The exam was otherwise without abnormality on                            direct and retroflexion views. Complications:            No immediate complications. Estimated blood loss:                            None. Estimated Blood Loss:     Estimated blood loss: none. Impression:               - Two 2 to 7 mm polyps in the descending colon and                            in the ascending colon, removed with a cold snare.                            Resected and retrieved.                           - Diverticulosis in the left colon.                           - The examination was otherwise normal on direct                            and retroflexion views. Recommendation:           - Patient has a contact number available for                            emergencies. The signs and symptoms of  potential                            delayed complications were discussed with the                            patient. Return to normal activities tomorrow.                            Written discharge instructions were provided to the                            patient.                           - Resume previous diet.                           - Continue present medications.                           You will receive a letter within 2-3 weeks with the  pathology results and my final recommendations.                           If the polyp(s) is proven to be 'pre-cancerous' on                            pathology, you will need repeat colonoscopy in 5                            years. If the polyp(s) is NOT 'precancerous' on                            pathology then you should repeat colon cancer                            screening in 10 years with colonoscopy without need                            for colon cancer screening by any method prior to                            then (including stool testing). Milus Banister, MD 06/01/2018 2:07:50 PM This report has been signed electronically.

## 2018-06-01 NOTE — Patient Instructions (Signed)
YOU HAD AN ENDOSCOPIC PROCEDURE TODAY AT [facility]:   Refer to the procedure report that was given to you for any specific questions about what was found during the examination.  If the procedure report does not answer your questions, please call your gastroenterologist to clarify.  If you requested that your care partner not be given the details of your procedure findings, then the procedure report has been included in a sealed envelope for you to review at your convenience later.  YOU SHOULD EXPECT: Some feelings of bloating in the abdomen. Passage of more gas than usual.  Walking can help get rid of the air that was put into your GI tract during the procedure and reduce the bloating. If you had a lower endoscopy (such as a colonoscopy or flexible sigmoidoscopy) you may notice spotting of blood in your stool or on the toilet paper. If you underwent a bowel prep for your procedure, you may not have a normal bowel movement for a few days.  Please Note:  You might notice some irritation and congestion in your nose or some drainage.  This is from the oxygen used during your procedure.  There is no need for concern and it should clear up in a day or so.  SYMPTOMS TO REPORT IMMEDIATELY:   Following lower endoscopy (colonoscopy or flexible sigmoidoscopy):  Excessive amounts of blood in the stool  Significant tenderness or worsening of abdominal pains  Swelling of the abdomen that is new, acute  Fever of 100F or higher  For urgent or emergent issues, a gastroenterologist can be reached at any hour by calling [phone].   DIET:  We do recommend a small meal at first, but then you may proceed to your regular diet.  Drink plenty of fluids but you should avoid alcoholic beverages for 24 hours.  ACTIVITY:  You should plan to take it easy for the rest of today and you should NOT DRIVE or use heavy machinery until tomorrow (because of the sedation medicines used during the test).     FOLLOW UP: Our staff will call the number listed on your records the next business day following your procedure to check on you and address any questions or concerns that you may have regarding the information given to you following your procedure. If we do not reach you, we will leave a message.  However, if you are feeling well and you are not experiencing any problems, there is no need to return our call.  We will assume that you have returned to your regular daily activities without incident.  If any biopsies were taken you will be contacted by phone or by letter within the next 1-3 weeks.  Please call us at [phone] if you have not heard about the biopsies in 3 weeks.    SIGNATURES/CONFIDENTIALITY: You and/or your care partner have signed paperwork which will be entered into your electronic medical record.  These signatures attest to the fact that that the information above on your After Visit Summary has been reviewed and is understood.  Full responsibility of the confidentiality of this discharge information lies with you and/or your care-partner.  Await pathology  Continue normal medications  Please read over handouts about polyps and diverticulosis

## 2018-06-04 ENCOUNTER — Telehealth: Payer: Self-pay | Admitting: *Deleted

## 2018-06-04 NOTE — Telephone Encounter (Signed)
  Follow up Call-  Call back number 06/01/2018  Post procedure Call Back phone  # [phone]  Permission to leave phone message Yes  Some recent data might be hidden     Patient questions:  Do you have a fever, pain , or abdominal swelling? No. Pain Score  0 *  Have you tolerated food without any problems? Yes.    Have you been able to return to your normal activities? Yes.    Do you have any questions about your discharge instructions: Diet   No. Medications  No. Follow up visit  No.  Do you have questions or concerns about your Care? No.  Actions: * If pain score is 4 or above: No action needed, pain <4.

## 2018-06-07 ENCOUNTER — Encounter: Payer: Self-pay | Admitting: Gastroenterology

## 2018-07-12 ENCOUNTER — Encounter: Payer: Self-pay | Admitting: *Deleted

## 2018-07-20 DIAGNOSIS — I10 Essential (primary) hypertension: Secondary | ICD-10-CM | POA: Diagnosis not present

## 2018-07-20 DIAGNOSIS — Z1331 Encounter for screening for depression: Secondary | ICD-10-CM | POA: Diagnosis not present

## 2018-07-20 DIAGNOSIS — D126 Benign neoplasm of colon, unspecified: Secondary | ICD-10-CM | POA: Diagnosis not present

## 2018-11-07 DIAGNOSIS — I10 Essential (primary) hypertension: Secondary | ICD-10-CM | POA: Diagnosis not present

## 2019-05-01 DIAGNOSIS — I10 Essential (primary) hypertension: Secondary | ICD-10-CM | POA: Diagnosis not present

## 2019-05-01 DIAGNOSIS — Z125 Encounter for screening for malignant neoplasm of prostate: Secondary | ICD-10-CM | POA: Diagnosis not present

## 2019-05-01 DIAGNOSIS — E7849 Other hyperlipidemia: Secondary | ICD-10-CM | POA: Diagnosis not present

## 2019-05-02 DIAGNOSIS — E7849 Other hyperlipidemia: Secondary | ICD-10-CM | POA: Diagnosis not present

## 2019-05-02 DIAGNOSIS — I10 Essential (primary) hypertension: Secondary | ICD-10-CM | POA: Diagnosis not present

## 2019-05-03 DIAGNOSIS — R82998 Other abnormal findings in urine: Secondary | ICD-10-CM | POA: Diagnosis not present

## 2019-05-08 DIAGNOSIS — D126 Benign neoplasm of colon, unspecified: Secondary | ICD-10-CM | POA: Diagnosis not present

## 2019-05-08 DIAGNOSIS — J309 Allergic rhinitis, unspecified: Secondary | ICD-10-CM | POA: Diagnosis not present

## 2019-05-08 DIAGNOSIS — Z0000 Encounter for general adult medical examination without abnormal findings: Secondary | ICD-10-CM | POA: Diagnosis not present

## 2019-05-08 DIAGNOSIS — I10 Essential (primary) hypertension: Secondary | ICD-10-CM | POA: Diagnosis not present

## 2019-05-08 DIAGNOSIS — E785 Hyperlipidemia, unspecified: Secondary | ICD-10-CM | POA: Diagnosis not present

## 2019-05-17 DIAGNOSIS — Z1212 Encounter for screening for malignant neoplasm of rectum: Secondary | ICD-10-CM | POA: Diagnosis not present

## 2019-06-07 DIAGNOSIS — L309 Dermatitis, unspecified: Secondary | ICD-10-CM | POA: Diagnosis not present

## 2019-06-07 DIAGNOSIS — D485 Neoplasm of uncertain behavior of skin: Secondary | ICD-10-CM | POA: Diagnosis not present

## 2019-06-07 DIAGNOSIS — L821 Other seborrheic keratosis: Secondary | ICD-10-CM | POA: Diagnosis not present

## 2019-06-07 DIAGNOSIS — D225 Melanocytic nevi of trunk: Secondary | ICD-10-CM | POA: Diagnosis not present

## 2019-06-07 DIAGNOSIS — L739 Follicular disorder, unspecified: Secondary | ICD-10-CM | POA: Diagnosis not present

## 2019-10-16 ENCOUNTER — Other Ambulatory Visit (HOSPITAL_COMMUNITY): Payer: Self-pay | Admitting: Nurse Practitioner

## 2019-11-14 DIAGNOSIS — L299 Pruritus, unspecified: Secondary | ICD-10-CM | POA: Diagnosis not present

## 2019-11-14 DIAGNOSIS — I10 Essential (primary) hypertension: Secondary | ICD-10-CM | POA: Diagnosis not present

## 2020-03-03 DIAGNOSIS — Z0120 Encounter for dental examination and cleaning without abnormal findings: Secondary | ICD-10-CM | POA: Diagnosis not present

## 2020-03-19 DIAGNOSIS — R059 Cough, unspecified: Secondary | ICD-10-CM | POA: Diagnosis not present

## 2020-03-19 DIAGNOSIS — J069 Acute upper respiratory infection, unspecified: Secondary | ICD-10-CM | POA: Diagnosis not present

## 2020-03-19 DIAGNOSIS — Z1152 Encounter for screening for COVID-19: Secondary | ICD-10-CM | POA: Diagnosis not present

## 2020-05-21 DIAGNOSIS — E785 Hyperlipidemia, unspecified: Secondary | ICD-10-CM | POA: Diagnosis not present

## 2020-05-21 DIAGNOSIS — Z125 Encounter for screening for malignant neoplasm of prostate: Secondary | ICD-10-CM | POA: Diagnosis not present

## 2020-05-27 DIAGNOSIS — Z1212 Encounter for screening for malignant neoplasm of rectum: Secondary | ICD-10-CM | POA: Diagnosis not present

## 2020-05-27 DIAGNOSIS — I10 Essential (primary) hypertension: Secondary | ICD-10-CM | POA: Diagnosis not present

## 2020-05-27 DIAGNOSIS — R82998 Other abnormal findings in urine: Secondary | ICD-10-CM | POA: Diagnosis not present

## 2020-05-27 DIAGNOSIS — J309 Allergic rhinitis, unspecified: Secondary | ICD-10-CM | POA: Diagnosis not present

## 2020-05-27 DIAGNOSIS — E785 Hyperlipidemia, unspecified: Secondary | ICD-10-CM | POA: Diagnosis not present

## 2020-05-27 DIAGNOSIS — Z0000 Encounter for general adult medical examination without abnormal findings: Secondary | ICD-10-CM | POA: Diagnosis not present

## 2020-06-30 DIAGNOSIS — L814 Other melanin hyperpigmentation: Secondary | ICD-10-CM | POA: Diagnosis not present

## 2020-06-30 DIAGNOSIS — D225 Melanocytic nevi of trunk: Secondary | ICD-10-CM | POA: Diagnosis not present

## 2020-06-30 DIAGNOSIS — L578 Other skin changes due to chronic exposure to nonionizing radiation: Secondary | ICD-10-CM | POA: Diagnosis not present

## 2020-06-30 DIAGNOSIS — L821 Other seborrheic keratosis: Secondary | ICD-10-CM | POA: Diagnosis not present

## 2020-07-08 DIAGNOSIS — I10 Essential (primary) hypertension: Secondary | ICD-10-CM | POA: Diagnosis not present

## 2020-07-08 DIAGNOSIS — R059 Cough, unspecified: Secondary | ICD-10-CM | POA: Diagnosis not present

## 2020-07-08 DIAGNOSIS — J309 Allergic rhinitis, unspecified: Secondary | ICD-10-CM | POA: Diagnosis not present

## 2020-07-08 DIAGNOSIS — J069 Acute upper respiratory infection, unspecified: Secondary | ICD-10-CM | POA: Diagnosis not present

## 2020-07-15 DIAGNOSIS — Z0000 Encounter for general adult medical examination without abnormal findings: Secondary | ICD-10-CM | POA: Diagnosis not present

## 2020-10-05 DIAGNOSIS — E785 Hyperlipidemia, unspecified: Secondary | ICD-10-CM | POA: Diagnosis not present

## 2021-07-29 DIAGNOSIS — I10 Essential (primary) hypertension: Secondary | ICD-10-CM | POA: Diagnosis not present

## 2021-07-29 DIAGNOSIS — Z125 Encounter for screening for malignant neoplasm of prostate: Secondary | ICD-10-CM | POA: Diagnosis not present

## 2021-07-29 DIAGNOSIS — E785 Hyperlipidemia, unspecified: Secondary | ICD-10-CM | POA: Diagnosis not present

## 2021-07-30 DIAGNOSIS — L814 Other melanin hyperpigmentation: Secondary | ICD-10-CM | POA: Diagnosis not present

## 2021-07-30 DIAGNOSIS — L578 Other skin changes due to chronic exposure to nonionizing radiation: Secondary | ICD-10-CM | POA: Diagnosis not present

## 2021-07-30 DIAGNOSIS — D225 Melanocytic nevi of trunk: Secondary | ICD-10-CM | POA: Diagnosis not present

## 2021-07-30 DIAGNOSIS — L821 Other seborrheic keratosis: Secondary | ICD-10-CM | POA: Diagnosis not present

## 2021-08-05 DIAGNOSIS — E785 Hyperlipidemia, unspecified: Secondary | ICD-10-CM | POA: Diagnosis not present

## 2021-08-05 DIAGNOSIS — Z1212 Encounter for screening for malignant neoplasm of rectum: Secondary | ICD-10-CM | POA: Diagnosis not present

## 2021-08-05 DIAGNOSIS — R82998 Other abnormal findings in urine: Secondary | ICD-10-CM | POA: Diagnosis not present

## 2021-08-05 DIAGNOSIS — Z0000 Encounter for general adult medical examination without abnormal findings: Secondary | ICD-10-CM | POA: Diagnosis not present

## 2021-08-05 DIAGNOSIS — I10 Essential (primary) hypertension: Secondary | ICD-10-CM | POA: Diagnosis not present

## 2021-08-05 DIAGNOSIS — D126 Benign neoplasm of colon, unspecified: Secondary | ICD-10-CM | POA: Diagnosis not present

## 2021-08-09 DIAGNOSIS — H5203 Hypermetropia, bilateral: Secondary | ICD-10-CM | POA: Diagnosis not present

## 2021-11-07 ENCOUNTER — Emergency Department (HOSPITAL_BASED_OUTPATIENT_CLINIC_OR_DEPARTMENT_OTHER)
Admission: EM | Admit: 2021-11-07 | Discharge: 2021-11-07 | Disposition: A | Discharge status: Home / Self Care | Payer: Medicare Other | Attending: Emergency Medicine | Admitting: Emergency Medicine

## 2021-11-07 ENCOUNTER — Emergency Department (HOSPITAL_BASED_OUTPATIENT_CLINIC_OR_DEPARTMENT_OTHER): Payer: Medicare Other

## 2021-11-07 ENCOUNTER — Encounter (HOSPITAL_BASED_OUTPATIENT_CLINIC_OR_DEPARTMENT_OTHER): Payer: Self-pay | Admitting: Obstetrics and Gynecology

## 2021-11-07 ENCOUNTER — Emergency Department (HOSPITAL_BASED_OUTPATIENT_CLINIC_OR_DEPARTMENT_OTHER): Payer: Medicare Other | Admitting: Radiology

## 2021-11-07 ENCOUNTER — Other Ambulatory Visit: Payer: Self-pay

## 2021-11-07 DIAGNOSIS — Y9355 Activity, bike riding: Secondary | ICD-10-CM | POA: Insufficient documentation

## 2021-11-07 DIAGNOSIS — Y92828 Other wilderness area as the place of occurrence of the external cause: Secondary | ICD-10-CM | POA: Diagnosis not present

## 2021-11-07 DIAGNOSIS — S2231XA Fracture of one rib, right side, initial encounter for closed fracture: Secondary | ICD-10-CM | POA: Diagnosis not present

## 2021-11-07 DIAGNOSIS — S2241XA Multiple fractures of ribs, right side, initial encounter for closed fracture: Secondary | ICD-10-CM | POA: Insufficient documentation

## 2021-11-07 DIAGNOSIS — S29001A Unspecified injury of muscle and tendon of front wall of thorax, initial encounter: Secondary | ICD-10-CM | POA: Diagnosis present

## 2021-11-07 LAB — CBC WITH DIFFERENTIAL/PLATELET
Abs Immature Granulocytes: 0.02 10*3/uL (ref 0.00–0.07)
Basophils Absolute: 0.1 10*3/uL (ref 0.0–0.1)
Basophils Relative: 1 %
Eosinophils Absolute: 0.3 10*3/uL (ref 0.0–0.5)
Eosinophils Relative: 4 %
HCT: 44.6 % (ref 39.0–52.0)
Hemoglobin: 16 g/dL (ref 13.0–17.0)
Immature Granulocytes: 0 %
Lymphocytes Relative: 22 %
Lymphs Abs: 1.8 10*3/uL (ref 0.7–4.0)
MCH: 30.5 pg (ref 26.0–34.0)
MCHC: 35.9 g/dL (ref 30.0–36.0)
MCV: 85 fL (ref 80.0–100.0)
Monocytes Absolute: 0.7 10*3/uL (ref 0.1–1.0)
Monocytes Relative: 9 %
Neutro Abs: 5.1 10*3/uL (ref 1.7–7.7)
Neutrophils Relative %: 64 %
Platelets: 175 10*3/uL (ref 150–400)
RBC: 5.25 MIL/uL (ref 4.22–5.81)
RDW: 12.3 % (ref 11.5–15.5)
WBC: 8 10*3/uL (ref 4.0–10.5)
nRBC: 0 % (ref 0.0–0.2)

## 2021-11-07 LAB — BASIC METABOLIC PANEL
Anion gap: 11 (ref 5–15)
BUN: 22 mg/dL (ref 8–23)
CO2: 26 mmol/L (ref 22–32)
Calcium: 10.1 mg/dL (ref 8.9–10.3)
Chloride: 103 mmol/L (ref 98–111)
Creatinine, Ser: 0.88 mg/dL (ref 0.61–1.24)
GFR, Estimated: >60 mL/min (ref >60)
Glucose, Bld: 104 mg/dL — ABNORMAL HIGH (ref 70–99)
Potassium: 4.1 mmol/L (ref 3.5–5.1)
Sodium: 140 mmol/L (ref 135–145)

## 2021-11-07 MED ORDER — LIDOCAINE 5 % EX PTCH: 1.0000 | MEDICATED_PATCH | CUTANEOUS | 0 refills | Status: DC

## 2021-11-07 MED ORDER — ONDANSETRON HCL 4 MG/2ML IJ SOLN
4.0000 mg | Freq: Once | INTRAMUSCULAR | Status: AC
  Administered 2021-11-07: 4 mg via INTRAVENOUS
  Filled 2021-11-07: qty 2

## 2021-11-07 MED ORDER — IOHEXOL 300 MG/ML  SOLN
100.0000 mL | Freq: Once | INTRAMUSCULAR | Status: AC | PRN
  Administered 2021-11-07: 75 mL via INTRAVENOUS

## 2021-11-07 MED ORDER — OXYCODONE-ACETAMINOPHEN 5-325 MG PO TABS: 1.0000 | ORAL_TABLET | Freq: Four times a day (QID) | ORAL | 0 refills | Status: DC | PRN

## 2021-11-07 MED ORDER — OXYCODONE-ACETAMINOPHEN 5-325 MG PO TABS
1.0000 | ORAL_TABLET | Freq: Once | ORAL | Status: AC
  Administered 2021-11-07: 1 via ORAL
  Filled 2021-11-07: qty 1

## 2021-11-07 MED ORDER — KETOROLAC TROMETHAMINE 15 MG/ML IJ SOLN
15.0000 mg | Freq: Once | INTRAMUSCULAR | Status: AC
  Administered 2021-11-07: 15 mg via INTRAVENOUS
  Filled 2021-11-07: qty 1

## 2021-11-07 MED ORDER — HYDROMORPHONE HCL 1 MG/ML IJ SOLN
1.0000 mg | Freq: Once | INTRAMUSCULAR | Status: AC
  Administered 2021-11-07: 1 mg via INTRAVENOUS
  Filled 2021-11-07: qty 1

## 2021-11-07 NOTE — ED Notes (Signed)
RT educated pt on proper use of incentive spirometer. Pt able to perform w/out difficulty. Pt goal set 1750 mL and pt able to achieve 2500 mL. Pt verbalizes understanding and teaching.
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
## 2021-11-07 NOTE — Discharge Instructions (Addendum)
In addition to the pain medication you can also take 3 ibuprofen every 6 hours for pain.  Also you can try Lidoderm patches.

## 2021-11-11 DIAGNOSIS — J309 Allergic rhinitis, unspecified: Secondary | ICD-10-CM | POA: Diagnosis not present

## 2021-11-11 DIAGNOSIS — D126 Benign neoplasm of colon, unspecified: Secondary | ICD-10-CM | POA: Diagnosis not present

## 2021-11-11 DIAGNOSIS — I10 Essential (primary) hypertension: Secondary | ICD-10-CM | POA: Diagnosis not present

## 2021-11-11 DIAGNOSIS — E785 Hyperlipidemia, unspecified: Secondary | ICD-10-CM | POA: Diagnosis not present

## 2022-03-01 DIAGNOSIS — R0781 Pleurodynia: Secondary | ICD-10-CM | POA: Diagnosis not present

## 2022-05-02 DIAGNOSIS — K080 Exfoliation of teeth due to systemic causes: Secondary | ICD-10-CM | POA: Diagnosis not present

## 2022-07-04 DIAGNOSIS — L723 Sebaceous cyst: Secondary | ICD-10-CM | POA: Diagnosis not present

## 2022-07-06 DIAGNOSIS — L282 Other prurigo: Secondary | ICD-10-CM | POA: Diagnosis not present

## 2022-08-16 DIAGNOSIS — E785 Hyperlipidemia, unspecified: Secondary | ICD-10-CM | POA: Diagnosis not present

## 2022-08-16 DIAGNOSIS — Z1212 Encounter for screening for malignant neoplasm of rectum: Secondary | ICD-10-CM | POA: Diagnosis not present

## 2022-08-16 DIAGNOSIS — I10 Essential (primary) hypertension: Secondary | ICD-10-CM | POA: Diagnosis not present

## 2022-08-16 DIAGNOSIS — Z125 Encounter for screening for malignant neoplasm of prostate: Secondary | ICD-10-CM | POA: Diagnosis not present

## 2022-08-16 DIAGNOSIS — R7989 Other specified abnormal findings of blood chemistry: Secondary | ICD-10-CM | POA: Diagnosis not present

## 2022-08-23 DIAGNOSIS — Z1339 Encounter for screening examination for other mental health and behavioral disorders: Secondary | ICD-10-CM | POA: Diagnosis not present

## 2022-08-23 DIAGNOSIS — Z1331 Encounter for screening for depression: Secondary | ICD-10-CM | POA: Diagnosis not present

## 2022-08-23 DIAGNOSIS — D126 Benign neoplasm of colon, unspecified: Secondary | ICD-10-CM | POA: Diagnosis not present

## 2022-08-23 DIAGNOSIS — R82998 Other abnormal findings in urine: Secondary | ICD-10-CM | POA: Diagnosis not present

## 2022-08-23 DIAGNOSIS — I10 Essential (primary) hypertension: Secondary | ICD-10-CM | POA: Diagnosis not present

## 2022-08-23 DIAGNOSIS — E785 Hyperlipidemia, unspecified: Secondary | ICD-10-CM | POA: Diagnosis not present

## 2022-08-23 DIAGNOSIS — J309 Allergic rhinitis, unspecified: Secondary | ICD-10-CM | POA: Diagnosis not present

## 2022-08-23 DIAGNOSIS — Z0000 Encounter for general adult medical examination without abnormal findings: Secondary | ICD-10-CM | POA: Diagnosis not present

## 2022-08-23 DIAGNOSIS — I498 Other specified cardiac arrhythmias: Secondary | ICD-10-CM | POA: Diagnosis not present

## 2022-08-29 DIAGNOSIS — L300 Nummular dermatitis: Secondary | ICD-10-CM | POA: Diagnosis not present

## 2022-08-29 DIAGNOSIS — S30861A Insect bite (nonvenomous) of abdominal wall, initial encounter: Secondary | ICD-10-CM | POA: Diagnosis not present

## 2022-10-24 DIAGNOSIS — K080 Exfoliation of teeth due to systemic causes: Secondary | ICD-10-CM | POA: Diagnosis not present

## 2022-11-18 DIAGNOSIS — L817 Pigmented purpuric dermatosis: Secondary | ICD-10-CM | POA: Diagnosis not present

## 2022-11-18 DIAGNOSIS — D225 Melanocytic nevi of trunk: Secondary | ICD-10-CM | POA: Diagnosis not present

## 2022-11-18 DIAGNOSIS — L821 Other seborrheic keratosis: Secondary | ICD-10-CM | POA: Diagnosis not present

## 2022-11-18 DIAGNOSIS — D2272 Melanocytic nevi of left lower limb, including hip: Secondary | ICD-10-CM | POA: Diagnosis not present

## 2022-11-18 DIAGNOSIS — L570 Actinic keratosis: Secondary | ICD-10-CM | POA: Diagnosis not present

## 2023-01-11 DIAGNOSIS — M542 Cervicalgia: Secondary | ICD-10-CM | POA: Diagnosis not present

## 2023-01-11 DIAGNOSIS — M47812 Spondylosis without myelopathy or radiculopathy, cervical region: Secondary | ICD-10-CM | POA: Diagnosis not present

## 2023-01-11 DIAGNOSIS — M25511 Pain in right shoulder: Secondary | ICD-10-CM | POA: Diagnosis not present

## 2023-01-31 DIAGNOSIS — M25511 Pain in right shoulder: Secondary | ICD-10-CM | POA: Diagnosis not present

## 2023-01-31 DIAGNOSIS — M542 Cervicalgia: Secondary | ICD-10-CM | POA: Diagnosis not present

## 2023-02-13 DIAGNOSIS — K080 Exfoliation of teeth due to systemic causes: Secondary | ICD-10-CM | POA: Diagnosis not present

## 2023-02-15 DIAGNOSIS — M25511 Pain in right shoulder: Secondary | ICD-10-CM | POA: Diagnosis not present

## 2023-02-15 DIAGNOSIS — M542 Cervicalgia: Secondary | ICD-10-CM | POA: Diagnosis not present

## 2023-02-23 DIAGNOSIS — M542 Cervicalgia: Secondary | ICD-10-CM | POA: Diagnosis not present

## 2023-02-23 DIAGNOSIS — M25511 Pain in right shoulder: Secondary | ICD-10-CM | POA: Diagnosis not present

## 2023-03-21 DIAGNOSIS — H5203 Hypermetropia, bilateral: Secondary | ICD-10-CM | POA: Diagnosis not present

## 2023-04-14 DIAGNOSIS — R059 Cough, unspecified: Secondary | ICD-10-CM | POA: Diagnosis not present

## 2023-04-14 DIAGNOSIS — R0981 Nasal congestion: Secondary | ICD-10-CM | POA: Diagnosis not present

## 2023-04-14 DIAGNOSIS — J101 Influenza due to other identified influenza virus with other respiratory manifestations: Secondary | ICD-10-CM | POA: Diagnosis not present

## 2023-04-24 DIAGNOSIS — Z5181 Encounter for therapeutic drug level monitoring: Secondary | ICD-10-CM | POA: Diagnosis not present

## 2023-04-24 DIAGNOSIS — L300 Nummular dermatitis: Secondary | ICD-10-CM | POA: Diagnosis not present

## 2023-04-24 DIAGNOSIS — L821 Other seborrheic keratosis: Secondary | ICD-10-CM | POA: Diagnosis not present

## 2023-05-01 DIAGNOSIS — K080 Exfoliation of teeth due to systemic causes: Secondary | ICD-10-CM | POA: Diagnosis not present

## 2023-08-22 DIAGNOSIS — L300 Nummular dermatitis: Secondary | ICD-10-CM | POA: Diagnosis not present

## 2023-08-23 DIAGNOSIS — Z125 Encounter for screening for malignant neoplasm of prostate: Secondary | ICD-10-CM | POA: Diagnosis not present

## 2023-08-23 DIAGNOSIS — E785 Hyperlipidemia, unspecified: Secondary | ICD-10-CM | POA: Diagnosis not present

## 2023-08-23 DIAGNOSIS — Z1212 Encounter for screening for malignant neoplasm of rectum: Secondary | ICD-10-CM | POA: Diagnosis not present

## 2023-09-05 DIAGNOSIS — H524 Presbyopia: Secondary | ICD-10-CM | POA: Diagnosis not present

## 2023-09-20 DIAGNOSIS — M25511 Pain in right shoulder: Secondary | ICD-10-CM | POA: Diagnosis not present

## 2023-09-20 DIAGNOSIS — I10 Essential (primary) hypertension: Secondary | ICD-10-CM | POA: Diagnosis not present

## 2023-09-25 DIAGNOSIS — M25511 Pain in right shoulder: Secondary | ICD-10-CM | POA: Diagnosis not present

## 2023-09-25 DIAGNOSIS — M542 Cervicalgia: Secondary | ICD-10-CM | POA: Diagnosis not present

## 2023-09-27 DIAGNOSIS — R0981 Nasal congestion: Secondary | ICD-10-CM | POA: Diagnosis not present

## 2023-09-27 DIAGNOSIS — J069 Acute upper respiratory infection, unspecified: Secondary | ICD-10-CM | POA: Diagnosis not present

## 2023-10-17 DIAGNOSIS — M542 Cervicalgia: Secondary | ICD-10-CM | POA: Diagnosis not present

## 2023-10-25 DIAGNOSIS — M542 Cervicalgia: Secondary | ICD-10-CM | POA: Diagnosis not present

## 2023-11-02 DIAGNOSIS — R531 Weakness: Secondary | ICD-10-CM | POA: Diagnosis not present

## 2023-11-02 DIAGNOSIS — M47812 Spondylosis without myelopathy or radiculopathy, cervical region: Secondary | ICD-10-CM | POA: Diagnosis not present

## 2023-11-06 DIAGNOSIS — I10 Essential (primary) hypertension: Secondary | ICD-10-CM | POA: Diagnosis not present

## 2023-11-06 DIAGNOSIS — Z0000 Encounter for general adult medical examination without abnormal findings: Secondary | ICD-10-CM | POA: Diagnosis not present

## 2023-11-06 DIAGNOSIS — Z1331 Encounter for screening for depression: Secondary | ICD-10-CM | POA: Diagnosis not present

## 2023-11-06 DIAGNOSIS — Z1339 Encounter for screening examination for other mental health and behavioral disorders: Secondary | ICD-10-CM | POA: Diagnosis not present

## 2023-11-06 DIAGNOSIS — M25511 Pain in right shoulder: Secondary | ICD-10-CM | POA: Diagnosis not present

## 2023-11-06 DIAGNOSIS — R82998 Other abnormal findings in urine: Secondary | ICD-10-CM | POA: Diagnosis not present

## 2023-11-14 ENCOUNTER — Encounter: Payer: Self-pay | Admitting: Gastroenterology

## 2023-11-14 DIAGNOSIS — R531 Weakness: Secondary | ICD-10-CM | POA: Diagnosis not present

## 2023-11-21 ENCOUNTER — Telehealth: Payer: Self-pay

## 2023-11-21 NOTE — Telephone Encounter (Signed)
 Multiple attempts made to reach patient. Phone is answered by google vitural assistance. When giving reason for the call the service states the patient is unable to take the call. Unable to leave a message as the phone auto hangs up. Pt will need to r/s by 5PM to avoid procedure cancellation.

## 2023-11-22 DIAGNOSIS — R531 Weakness: Secondary | ICD-10-CM | POA: Diagnosis not present

## 2023-11-27 DIAGNOSIS — L821 Other seborrheic keratosis: Secondary | ICD-10-CM | POA: Diagnosis not present

## 2023-11-27 DIAGNOSIS — L814 Other melanin hyperpigmentation: Secondary | ICD-10-CM | POA: Diagnosis not present

## 2023-11-27 DIAGNOSIS — D225 Melanocytic nevi of trunk: Secondary | ICD-10-CM | POA: Diagnosis not present

## 2023-11-27 DIAGNOSIS — L578 Other skin changes due to chronic exposure to nonionizing radiation: Secondary | ICD-10-CM | POA: Diagnosis not present

## 2023-11-27 DIAGNOSIS — L570 Actinic keratosis: Secondary | ICD-10-CM | POA: Diagnosis not present

## 2023-11-28 ENCOUNTER — Telehealth: Payer: Self-pay | Admitting: *Deleted

## 2023-11-28 ENCOUNTER — Encounter: Payer: Self-pay | Admitting: Gastroenterology

## 2023-11-28 ENCOUNTER — Ambulatory Visit (AMBULATORY_SURGERY_CENTER)

## 2023-11-28 VITALS — Ht 70.0 in | Wt 165.0 lb

## 2023-11-28 DIAGNOSIS — Z860100 Personal history of colon polyps, unspecified: Secondary | ICD-10-CM

## 2023-11-28 MED ORDER — PEG 3350-KCL-NA BICARB-NACL 420 G PO SOLR: 4000.0000 mL | Freq: Once | ORAL | 0 refills | Status: AC

## 2023-11-28 NOTE — Progress Notes (Addendum)
 Pt's name and DOB verified at the beginning of the pre-visit with 2 identifiers  Pt denies any difficulty with ambulating,sitting, laying down or rolling side to side   Pt uses ambulation assistance device or has issues with mobiiity  Pt has no issues moving head neck or swallowing  No egg or soy allergy known to patient   No issues known to pt with past sedation with any surgeries or procedures  No FH of Malignant Hyperthermia  Pt is not on home 02   Pt is not on blood thinners   Pt denies issues with constipation   Pt is not on dialysis  Pt denise any abnormal heart rhythms   Pt denies any upcoming cardiac testing  Patient's chart reviewed by Norleen Schillings CNRA prior to pre-visit and patient appropriate for the [facility].  Pre-visit completed and red dot placed by patient's name on their procedure day (on provider's schedule).    Visit by phone  Pt states weight is 165 lb  Instructions reviewed. Pt given  both [facility] main # and MD on call # prior to instructions.   Informed pt to come in at the time discussed and is shown on PV instructions. Pt informed that they are coming in i.e. cloths change.,IV placement , Consent signing and meeting CRNA. Pt states understanding after  given opportunity to ask questions t after all  instructions given.  Instructed pt to review instructions again prior to procedure and call main # given if has any questions.. Pt states they will.   Instructed to call if has not received instructions through the mail by next week. Pt states he will

## 2023-11-28 NOTE — Telephone Encounter (Signed)
 Attempt to reach pt for pre-visit. Vertual answering stated after ID'd self that person could not speak at this time and hung up   Pt called back

## 2023-11-29 DIAGNOSIS — I671 Cerebral aneurysm, nonruptured: Secondary | ICD-10-CM | POA: Diagnosis not present

## 2023-11-30 ENCOUNTER — Other Ambulatory Visit: Payer: Self-pay | Admitting: Neurosurgery

## 2023-11-30 ENCOUNTER — Ambulatory Visit (INDEPENDENT_AMBULATORY_CARE_PROVIDER_SITE_OTHER): Admitting: Neurology

## 2023-11-30 ENCOUNTER — Encounter: Payer: Self-pay | Admitting: Neurology

## 2023-11-30 VITALS — BP 128/74 | Ht 70.0 in | Wt 168.0 lb

## 2023-11-30 DIAGNOSIS — R531 Weakness: Secondary | ICD-10-CM | POA: Diagnosis not present

## 2023-11-30 DIAGNOSIS — R202 Paresthesia of skin: Secondary | ICD-10-CM | POA: Insufficient documentation

## 2023-11-30 DIAGNOSIS — I671 Cerebral aneurysm, nonruptured: Secondary | ICD-10-CM

## 2023-11-30 NOTE — Progress Notes (Signed)
 Chief Complaint  Patient presents with   New Patient (Initial Visit)    Rm 14, NP with wife, Numbness/weakness in legs for 3-4 months   ASSESSMENT AND PLAN  Roger Wilson is a 68 y.o. male   Subacute onset of bilateral upper extremity intermittent paresthesia, fatigue, subjective weakness,  Essentially normal neurological examination, well-preserved deep tendon reflex, occasionally left calf muscle fasciculation,  Laboratory evaluation including systemic inflammatory markers, muscle enzyme, to rule out inflammatory process  EMG nerve conduction study for possible upper extremity focal neuropathy  Already had MRI of the brain, incidental findings of ACOM aneurysm, pending angiogram evaluation by neurosurgeon Dr. Lanis,  Reported MRI of cervical spine at [facility], only mild degenerative changes, no evidence of cord or nerve root compression,  DIAGNOSTIC DATA (LABS, IMAGING, TESTING) - I reviewed patient records, labs, notes, testing and imaging myself where available.   MEDICAL HISTORY:  Roger Wilson is a 68 year old male, seen in request by neurosurgeon Dr. Colon Shove for evaluation of numbness tingling in legs, and upper extremity, primary care is from [facility] Dr. Avva, Ravisankar, initial evaluation was on November 30, 2023 with his wife  History is obtained from the patient and review of electronic medical records. I personally reviewed pertinent available imaging films in PACS.   PMHx of  HLD HTN Drink moderately daily, Pericarditis 30 years ago, presented with chest pain, recurrent again.   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  He began to notice numbness in his right hand mainly involving the right 3rd and 4th fingers, had to switch his hand while driving car, then began to noticed left hand involvement,  Around Aug 20, 2023, he had acute worsening of for limb paresthesia,  worsening upper and lower extremity numbness, extreme fatigue, difficulty picking up his dog's bowel off the floor, like he has run cycling for 100 miles, whole body achy fatigue sensation, he denies bowel and bladder incontinence, denies bulbar weakness no double vision  He also noticed intermittent calf muscle twitching, poor appetite lost about 20 pounds over the past 8 months  He was seen by orthopedic surgeon, reported MRI of cervical spine in May 2025 at [facility] showed mild degenerative changes no cord or nerve compression  He was also seen by neurosurgeon Dr. Colon, ordered MRI of the brain, there was incidental findings of a comm 5 mm aneurysm, has pending four-vessel angiogram by Dr. Rosalinda soon   PHYSICAL EXAM:   Vitals:   11/30/23 0914  BP: 128/74  Weight: 168 lb (76.2 kg)  Height: 5' 10 (1.778 m)     Body mass index is 24.11 kg/m.  PHYSICAL EXAMNIATION:  Gen: NAD, conversant, well nourised, well groomed                     Cardiovascular: Regular rate rhythm, no peripheral edema, warm, nontender. Eyes: Conjunctivae clear without exudates or hemorrhage Neck: Supple, no carotid bruits. Pulmonary: Clear to auscultation bilaterally   NEUROLOGICAL EXAM:  MENTAL STATUS: Speech/cognition: Awake, alert, oriented to history taking and casual conversation CRANIAL NERVES: CN II: Visual fields are full to confrontation. Pupils are round equal and briskly reactive to light. CN III, IV, VI: extraocular movement are normal. No ptosis. CN V: Facial sensation is intact to light touch CN VII: Face is symmetric with normal eye closure  CN VIII: Hearing is normal to causal conversation. CN IX, X:  Phonation is normal. CN XI: Head turning and shoulder shrug are intact  MOTOR: There is no pronator drift of out-stretched arms. Muscle bulk and tone are normal. Muscle strength is normal.  REFLEXES: Reflexes are 2+ and symmetric at the biceps, triceps, knees, and ankles.  Plantar responses are flexor.  SENSORY: Intact to light touch, pinprick and vibratory sensation are intact in fingers and toes.  COORDINATION: There is no trunk or limb dysmetria noted.  GAIT/STANCE: Able to get up from seated position arm crossed, steady  REVIEW OF SYSTEMS:  Full 14 system review of systems performed and notable only for as above All other review of systems were negative.   ALLERGIES: Allergies  Allergen Reactions   Morphine     Veins turn red after injection    HOME MEDICATIONS: Current Outpatient Medications  Medication Sig Dispense Refill   Ascorbic Acid (VITAMIN C PO) Take by mouth daily.     aspirin 81 MG tablet Take 81 mg by mouth daily.     clobetasol cream (TEMOVATE) 0.05 % 1 Application.     Cyanocobalamin (B-12 PO) Take by mouth daily.     fish oil-omega-3 fatty acids 1000 MG capsule Take by mouth daily.     fluticasone (FLONASE) 50 MCG/ACT nasal spray Place into both nostrils daily.     hydrOXYzine (ATARAX) 10 MG tablet      Loratadine  (CLARITIN  PO) Take 10 mg by mouth daily.      Multiple Vitamin (MULTIVITAMIN) tablet Take 1 tablet by mouth daily.     rosuvastatin (CRESTOR) 10 MG tablet Take 10 mg by mouth daily.     Telmisartan-amLODIPine 40-10 MG TABS daily at 12 noon.     Zinc 50 MG TABS Zinc     Calcium Carbonate-Vitamin D  (CALCIUM + D PO) Take by mouth daily. (Patient not taking: Reported on 11/30/2023)     lidocaine  (LIDODERM ) 5 % Place 1 patch onto the skin daily. Remove & Discard patch within 12 hours or as directed by MD (Patient not taking: Reported on 11/30/2023) 30 patch 0   methocarbamol  (ROBAXIN ) 500 MG tablet Take 1 tablet (500 mg total) by mouth every 6 (six) hours as needed for muscle spasms. (Patient not taking: Reported on 11/30/2023) 60 tablet 0   oxyCODONE -acetaminophen  (PERCOCET/ROXICET) 5-325 MG tablet Take 1 tablet by mouth every 6 (six) hours as needed for severe pain. (Patient not taking: Reported on 11/30/2023) 15 tablet 0    Current Facility-Administered Medications  Medication Dose Route Frequency Provider Last Rate Last Admin   0.9 %  sodium chloride  infusion  500 mL Intravenous Once Teressa Toribio SQUIBB, MD        PAST MEDICAL HISTORY: Past Medical History:  Diagnosis Date   Allergy    Arthritis    Asthma    as a child only    Environmental allergies    Hyperlipidemia    Hypertension    Neuromuscular disorder (HCC)    Pericarditis    x2 30 + yrs ago     PAST SURGICAL HISTORY: Past Surgical History:  Procedure Laterality Date   COLONOSCOPY     KNEE ARTHROSCOPY  2002   left   POLYPECTOMY      FAMILY HISTORY: Family History  Problem Relation Age of Onset   Diabetes Mother    Hypertension Mother    Colon cancer Neg Hx    Colon polyps Neg Hx    Esophageal cancer Neg Hx    Stomach cancer Neg Hx  Rectal cancer Neg Hx     SOCIAL HISTORY: Social History   Socioeconomic History   Marital status: Married    Spouse name: Not on file   Number of children: Not on file   Years of education: Not on file   Highest education level: Not on file  Occupational History   Not on file  Tobacco Use   Smoking status: Never    Passive exposure: Never   Smokeless tobacco: Never  Vaping Use   Vaping status: Never Used  Substance and Sexual Activity   Alcohol  use: Yes    Alcohol /week: 7.0 standard drinks of alcohol     Types: 7 Cans of beer per week   Drug use: No   Sexual activity: Yes  Other Topics Concern   Not on file  Social History Narrative   Right handed   Caffeine-1-3 cups daily   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 Ph.D.  [facility] [address], [address] [place], [place] [address] Ph: [phone] Fax: [phone]  CC:  Colon Shove, MD [address] [place],  [place] [address]  Avva, Ravisankar, MD

## 2023-12-02 ENCOUNTER — Telehealth: Payer: Self-pay | Admitting: Neurology

## 2023-12-02 NOTE — Telephone Encounter (Signed)
 Please change his EMG/NCS to August 18th, Monday at 3:45, let him aware, he might have to wait

## 2023-12-04 ENCOUNTER — Telehealth: Payer: Self-pay | Admitting: Neurology

## 2023-12-04 ENCOUNTER — Encounter: Payer: Self-pay | Admitting: Neurology

## 2023-12-04 ENCOUNTER — Ambulatory Visit (INDEPENDENT_AMBULATORY_CARE_PROVIDER_SITE_OTHER): Admitting: Neurology

## 2023-12-04 ENCOUNTER — Ambulatory Visit: Payer: Self-pay | Admitting: Neurology

## 2023-12-04 ENCOUNTER — Telehealth: Payer: Self-pay | Admitting: *Deleted

## 2023-12-04 VITALS — BP 110/68 | Ht 70.0 in | Wt 166.0 lb

## 2023-12-04 DIAGNOSIS — R531 Weakness: Secondary | ICD-10-CM | POA: Diagnosis not present

## 2023-12-04 DIAGNOSIS — M791 Myalgia, unspecified site: Secondary | ICD-10-CM | POA: Diagnosis not present

## 2023-12-04 DIAGNOSIS — R202 Paresthesia of skin: Secondary | ICD-10-CM

## 2023-12-04 LAB — MULTIPLE MYELOMA PANEL, SERUM
Albumin SerPl Elph-Mcnc: 3.7 g/dL (ref 2.9–4.4)
Albumin/Glob SerPl: 1.2 (ref 0.7–1.7)
Alpha 1: 0.3 g/dL (ref 0.0–0.4)
Alpha2 Glob SerPl Elph-Mcnc: 0.8 g/dL (ref 0.4–1.0)
B-Globulin SerPl Elph-Mcnc: 1.1 g/dL (ref 0.7–1.3)
Gamma Glob SerPl Elph-Mcnc: 1.1 g/dL (ref 0.4–1.8)
Globulin, Total: 3.3 g/dL (ref 2.2–3.9)
IgA/Immunoglobulin A, Serum: 292 mg/dL (ref 61–437)
IgG (Immunoglobin G), Serum: 1082 mg/dL (ref 603–1613)
IgM (Immunoglobulin M), Srm: 149 mg/dL (ref 20–172)
Total Protein: 7 g/dL (ref 6.0–8.5)

## 2023-12-04 LAB — RPR: RPR Ser Ql: NONREACTIVE

## 2023-12-04 LAB — ANA W/REFLEX IF POSITIVE: Anti Nuclear Antibody (ANA): NEGATIVE

## 2023-12-04 LAB — CK: Total CK: 56 U/L (ref 41–331)

## 2023-12-04 LAB — HGB A1C W/O EAG: Hgb A1c MFr Bld: 5.4 % (ref 4.8–5.6)

## 2023-12-04 LAB — FOLATE: Folate: 17.3 ng/mL (ref >3.0)

## 2023-12-04 LAB — C-REACTIVE PROTEIN: CRP: 4 mg/L (ref 0–10)

## 2023-12-04 LAB — TSH: TSH: 1.69 u[IU]/mL (ref 0.450–4.500)

## 2023-12-04 LAB — VITAMIN B12: Vitamin B-12: 1232 pg/mL (ref 232–1245)

## 2023-12-04 LAB — VITAMIN D 25 HYDROXY (VIT D DEFICIENCY, FRACTURES): Vit D, 25-Hydroxy: 58.3 ng/mL (ref 30.0–100.0)

## 2023-12-04 LAB — SEDIMENTATION RATE: Sed Rate: 27 mm/h (ref 0–30)

## 2023-12-04 LAB — LYME DISEASE SEROLOGY W/REFLEX: Lyme Total Antibody EIA: NEGATIVE

## 2023-12-04 MED ORDER — DULOXETINE HCL 30 MG PO CPEP: 30.0000 mg | ORAL_CAPSULE | Freq: Every day | ORAL | 5 refills | Status: AC

## 2023-12-04 NOTE — Telephone Encounter (Signed)
 Patient calling in regards to previous note. Please advise.   Patient asking to respond via MyChart if unable to reach his phone.

## 2023-12-04 NOTE — Telephone Encounter (Signed)
 Inbound call from patient states he previously has a brain aneurism and would like to speak with a nurse in regards to being put under anesthesia. States he has an apt with a neurologist today as well as a procedure on the 28 th of this month to look further into his medical.   Please advise at [phone]

## 2023-12-04 NOTE — Telephone Encounter (Signed)
 Patient called back and accepted appointment for this afternoon with Dr Onita

## 2023-12-04 NOTE — Telephone Encounter (Signed)
 LVM and sent mychart msg offering sooner appt with Dr. Onita  If patient calls back you can offer this afternoon at 3:45pm

## 2023-12-04 NOTE — Telephone Encounter (Signed)
 Dr. Legrand,  This pt is scheduled with you tomorrow 12/04/23 for an elective screening colonoscopy.  It came to my attention today that he is undergoing a w/u for 5mm  ACOM aneurysm.  We are unable to clear him for care at [facility] until this w/u is complete.  I called the pt, we had a cordial discussion, and he understands the need to cancel his colonoscopy.  Regards,  Norleen EMERSON Schillings

## 2023-12-04 NOTE — Progress Notes (Unsigned)
 Chief Complaint  Patient presents with   nerve conduction    Rm 4   ASSESSMENT AND PLAN  Roger Wilson is a 68 y.o. male   Subacute onset of bilateral upper extremity intermittent paresthesia, fatigue, subjective weakness,,  Laboratory evaluation was essentially normal, no treatable cause identified  EMG nerve conduction study only showed mild bilateral carpal tunnel syndromes, no evidence of intrinsic muscle disease or peripheral neuropathy  Already had MRI of the brain, incidental findings of ACOM aneurysm, pending angiogram evaluation by neurosurgeon Dr. Lanis,  MRI of cervical spine showed mild degenerative changes no evidence of cord or nerve root compression,  No clear neurological etiology for his complaints of generalized weakness, paresthesia,  Will start low-dose of Cymbalta  30 mg daily,  Follow-up in office in 6 months  DIAGNOSTIC DATA (LABS, IMAGING, TESTING) - I reviewed patient records, labs, notes, testing and imaging myself where available.   MEDICAL HISTORY:  Roger Wilson is a 68 year old male, seen in request by neurosurgeon Dr. Colon Shove for evaluation of numbness tingling in legs, and upper extremity, primary care is from [facility] Dr. Avva, Ravisankar, initial evaluation was on November 30, 2023 with his wife  History is obtained from the patient and review of electronic medical records. I personally reviewed pertinent available imaging films in PACS.   PMHx of  HLD HTN Drink moderately daily, Pericarditis 30 years ago, presented with chest pain, recurrent again.   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  He began to notice numbness in his right hand mainly involving the right 3rd and 4th fingers, had to switch his hand while driving car, then began to noticed left hand involvement,  Around Aug 20, 2023, he had acute worsening of for limb paresthesia, worsening upper  and lower extremity numbness, extreme fatigue, difficulty picking up his dog's bowel off the floor, like he has run cycling for 100 miles, whole body achy fatigue sensation, he denies bowel and bladder incontinence, denies bulbar weakness no double vision  He also noticed intermittent calf muscle twitching, poor appetite lost about 20 pounds over the past 8 months  He was seen by orthopedic surgeon, reported MRI of cervical spine in May 2025 at [facility] showed mild degenerative changes no cord or nerve compression  He was also seen by neurosurgeon Dr. Colon, ordered MRI of the brain, there was incidental findings of a comm 5 mm aneurysm, has pending four-vessel angiogram by Dr. Rosalinda soon  UPDATE August 18th 2025: MRI of the cervical spine from [facility] October 17, 2023, multilevel degenerative changes, there was no significant canal foraminal narrowing EMG nerve conduction study today only showed mild bilateral carpal tunnel syndromes, there is no evidence of large fiber peripheral neuropathy or intrinsic muscle disease.  Laboratory evaluations showed normal B12, TSH, folic acid, C-reactive protein, ESR, ANA protein electrophoresis, vitamin D , negative Lyme titers,   PHYSICAL EXAM:   Vitals:   12/04/23 1544  BP: 110/68  Weight: 166 lb (75.3 kg)  Height: 5' 10 (1.778 m)     Body mass index is 23.82 kg/m.  PHYSICAL EXAMNIATION:  Gen: NAD, conversant, well nourised, well groomed                     Cardiovascular: Regular rate rhythm, no peripheral edema, warm, nontender. Eyes: Conjunctivae clear without exudates or hemorrhage Neck: Supple, no carotid bruits. Pulmonary: Clear to auscultation bilaterally  NEUROLOGICAL EXAM:  MENTAL STATUS: Speech/cognition: Awake, alert, oriented to history taking and casual conversation CRANIAL NERVES: CN II: Visual fields are full to confrontation. Pupils are round equal and briskly reactive to light. CN III, IV, VI: extraocular  movement are normal. No ptosis. CN V: Facial sensation is intact to light touch CN VII: Face is symmetric with normal eye closure  CN VIII: Hearing is normal to causal conversation. CN IX, X: Phonation is normal. CN XI: Head turning and shoulder shrug are intact  MOTOR: There is no pronator drift of out-stretched arms. Muscle bulk and tone are normal. Muscle strength is normal.  REFLEXES: Reflexes are 2+ and symmetric at the biceps, triceps, knees, and ankles. Plantar responses are flexor.  SENSORY: Intact to light touch, pinprick and vibratory sensation are intact in fingers and toes.  COORDINATION: There is no trunk or limb dysmetria noted.  GAIT/STANCE: Able to get up from seated position arm crossed, steady  REVIEW OF SYSTEMS:  Full 14 system review of systems performed and notable only for as above All other review of systems were negative.   ALLERGIES: Allergies  Allergen Reactions   Morphine     Veins turn red after injection    HOME MEDICATIONS: Current Outpatient Medications  Medication Sig Dispense Refill   Ascorbic Acid (VITAMIN C PO) Take by mouth daily.     clobetasol cream (TEMOVATE) 0.05 % 1 Application.     Cyanocobalamin (B-12 PO) Take by mouth daily.     fish oil-omega-3 fatty acids 1000 MG capsule Take by mouth daily.     fluticasone (FLONASE) 50 MCG/ACT nasal spray Place into both nostrils daily.     hydrOXYzine (ATARAX) 10 MG tablet      rosuvastatin (CRESTOR) 10 MG tablet Take 10 mg by mouth daily.     Telmisartan-amLODIPine 40-10 MG TABS daily at 12 noon.     Zinc 50 MG TABS Zinc     aspirin 81 MG tablet Take 81 mg by mouth daily.     Calcium Carbonate-Vitamin D  (CALCIUM + D PO) Take by mouth daily. (Patient not taking: Reported on 11/30/2023)     lidocaine  (LIDODERM ) 5 % Place 1 patch onto the skin daily. Remove & Discard patch within 12 hours or as directed by MD (Patient not taking: Reported on 12/04/2023) 30 patch 0   Loratadine  (CLARITIN  PO)  Take 10 mg by mouth daily.      methocarbamol  (ROBAXIN ) 500 MG tablet Take 1 tablet (500 mg total) by mouth every 6 (six) hours as needed for muscle spasms. (Patient not taking: Reported on 12/04/2023) 60 tablet 0   Multiple Vitamin (MULTIVITAMIN) tablet Take 1 tablet by mouth daily. (Patient not taking: Reported on 12/04/2023)     oxyCODONE -acetaminophen  (PERCOCET/ROXICET) 5-325 MG tablet Take 1 tablet by mouth every 6 (six) hours as needed for severe pain. (Patient not taking: Reported on 11/30/2023) 15 tablet 0   Current Facility-Administered Medications  Medication Dose Route Frequency Provider Last Rate Last Admin   0.9 %  sodium chloride  infusion  500 mL Intravenous Once Teressa Toribio SQUIBB, MD        PAST MEDICAL HISTORY: Past Medical History:  Diagnosis Date   Allergy    Arthritis    Asthma    as a child only    Environmental allergies    Hyperlipidemia    Hypertension    Neuromuscular disorder (HCC)    Pericarditis    x2 30 + yrs ago     PAST  SURGICAL HISTORY: Past Surgical History:  Procedure Laterality Date   COLONOSCOPY     KNEE ARTHROSCOPY  2002   left   POLYPECTOMY      FAMILY HISTORY: Family History  Problem Relation Age of Onset   Diabetes Mother    Hypertension Mother    Colon cancer Neg Hx    Colon polyps Neg Hx    Esophageal cancer Neg Hx    Stomach cancer Neg Hx    Rectal cancer Neg Hx     SOCIAL HISTORY: Social History   Socioeconomic History   Marital status: Married    Spouse name: Not on file   Number of children: Not on file   Years of education: Not on file   Highest education level: Not on file  Occupational History   Not on file  Tobacco Use   Smoking status: Never    Passive exposure: Never   Smokeless tobacco: Never  Vaping Use   Vaping status: Never Used  Substance and Sexual Activity   Alcohol  use: Yes    Alcohol /week: 7.0 standard drinks of alcohol     Types: 7 Cans of beer per week   Drug use: No   Sexual activity: Yes   Other Topics Concern   Not on file  Social History Narrative   Right handed   Caffeine-1-3 cups daily   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, car business   Social Drivers of Corporate investment banker Strain: Not on file  Food Insecurity: Not on file  Transportation Needs: Not on file  Physical Activity: Not on file  Stress: Not on file  Social Connections: Unknown (08/29/2021)   [facility] from [facility]   Social Network    Social Network: Not on file  Intimate Partner Violence: Unknown (07/21/2021)   Received from [facility]   HITS    Physically Hurt: Not on file    Insult or Talk Down To: Not on file    Threaten Physical Harm: Not on file    Scream or Curse: Not on file      Modena Callander, M.D. Ph.D.  [facility] [address], [address] [place], [place] [address] Ph: [phone] Fax: [phone]  CC:  Janey Santos, MD [address] [place],  [place] [address]  Janey Santos, MD

## 2023-12-04 NOTE — Procedures (Unsigned)
 Full Name: Roger Wilson Gender: Male MRN #: [id number] Date of Birth: 1955/10/18    Visit Date: 12/04/2023 16:22 Age: 68 Years Examining Physician: Onita Duos Referring Physician: Onita Duos Height: 5 feet 10 inch History: 68 year old male presenting with bilateral upper and lower extremity intermittent paresthesia, fatigue, subjective weakness  Summary of the test:  Nerve conduction study: Right sural, superficial peroneal, bilateral ulnar, radial sensory responses were normal.  Right tibial, peroneal to EDB, bilateral ulnar motor responses were normal.  Bilateral median sensory response showed moderately prolonged peak latency with moderate to severely decreased snap amplitude, right worse than left.  Bilateral median motor responses showed mildly prolonged distal latency, with well-preserved CMAP amplitude, right worse than left.  Electromyography:  Selected needle examinations of right lower extremity muscles, right lumbosacral paraspinal muscles, right upper extremity muscles and right cervical paraspinal muscles were performed.  Bilateral abductor pollicis brevis had a normal insertional activity, mixture of normal, mildly enlarged motor unit potentials with mildly decreased recruitment patterns.  Rest of the needle examination is more normal.   Conclusion: This is a mild abnormal study.  There is electrodiagnostic evidence of mild bilateral distal median neuropathy across the wrist, consistent with mild bilateral carpal tunnel syndromes.  There is no evidence of large fiber peripheral neuropathy or intrinsic muscle disease.    ------------------------------- Duos Onita. M.D. Ph.D.   [facility] [address] [place], [place] [address] Tel: [phone] Fax: [phone]  Verbal informed consent was obtained from the patient, patient was informed of potential risk of procedure, including bruising, bleeding, hematoma formation, infection,  muscle weakness, muscle pain, numbness, among others.        MNC    Nerve / Sites Muscle Latency Ref. Amplitude Ref. Rel Amp Segments Distance Velocity Ref. Area    ms ms mV mV %  cm m/s m/s mVms  R Median - APB     Wrist APB 5.1 <=4.4 4.4 >=4.0 100 Wrist - APB 7   12.3     Upper arm APB 9.4  3.7  83.3 Upper arm - Wrist 22 52 >=49 11.8  L Median - APB     Wrist APB 4.6 <=4.4 6.4 >=4.0 100 Wrist - APB 7   21.0     Upper arm APB 8.8  5.3  83.2 Upper arm - Wrist 23 54 >=49 18.6  R Ulnar - ADM     Wrist ADM 2.9 <=3.3 7.0 >=6.0 100 Wrist - ADM 7   22.9     B.Elbow ADM 5.3  8.1  116 B.Elbow - Wrist 15 63 >=49 23.3     A.Elbow ADM 7.6  6.7  83 A.Elbow - B.Elbow 14 58 >=49 22.5  L Ulnar - ADM     Wrist ADM 3.1 <=3.3 8.1 >=6.0 100 Wrist - ADM 7   23.4     B.Elbow ADM 5.4  7.7  94.3 B.Elbow - Wrist 12 52 >=49 21.9     A.Elbow ADM 7.6  7.1  93 A.Elbow - B.Elbow 11 49 >=49 21.4  R Peroneal - EDB     Ankle EDB 5.1 <=6.5 2.0 >=2.0 100 Ankle - EDB 9   4.9     Fib head EDB 11.6  2.1  103 Fib head - Ankle 29 44 >=44 5.9     Pop fossa EDB 14.2  2.5  120 Pop fossa - Fib head 12 46 >=44 9.6  Pop fossa - Ankle      R Tibial - AH     Ankle AH 4.9 <=5.8 7.1 >=4.0 100 Ankle - AH 9   17.5     Pop fossa AH 16.5  4.7  66.4 Pop fossa - Ankle 48 41 >=41 13.8                 SNC    Nerve / Sites Rec. Site Peak Lat Ref.  Amp Ref. Segments Distance    ms ms V V  cm  R Radial - Anatomical snuff box (Forearm)     Forearm Wrist 2.0 <=2.9 19 >=15 Forearm - Wrist 10  L Radial - Anatomical snuff box (Forearm)     Forearm Wrist 1.9 <=2.9 19 >=15 Forearm - Wrist 10  R Sural - Ankle (Calf)     Calf Ankle 4.1 <=4.4 9 >=6 Calf - Ankle 14  R Superficial peroneal - Ankle     Lat leg Ankle 4.1 <=4.4 8 >=6 Lat leg - Ankle 14  R Median - Orthodromic (Dig II, Mid palm)     Dig II Wrist 4.8 <=3.4 4 >=10 Dig II - Wrist 13  L Median - Orthodromic (Dig II, Mid palm)     Dig II Wrist 3.9 <=3.4 6 >=10 Dig II - Wrist  13  R Ulnar - Orthodromic, (Dig V, Mid palm)     Dig V Wrist 3.0 <=3.1 5 >=5 Dig V - Wrist 11  L Ulnar - Orthodromic, (Dig V, Mid palm)     Dig V Wrist 3.1 <=3.1 5 >=5 Dig V - Wrist 78                     F  Wave    Nerve F Lat Ref.   ms ms  R Ulnar - ADM 30.3 <=32.0  L Ulnar - ADM 29.4 <=32.0  R Tibial - AH 51.6 <=56.0           EMG Summary Table    Spontaneous MUAP Recruitment  Muscle IA Fib PSW Fasc Other Amp Dur. Poly Pattern  R. Tibialis anterior Normal None None None _______ Normal Normal Normal Normal  R. Tibialis posterior Normal None None None _______ Normal Normal Normal Normal  R. Peroneus longus Normal None None None _______ Normal Normal Normal Normal  R. Gastrocnemius (Medial head) Normal None None None _______ Normal Normal Normal Normal  R. Vastus lateralis Normal None None None _______ Normal Normal Normal Normal  R. Lumbar paraspinals (low) Normal None None None _______ Normal Normal Normal Normal  R. Lumbar paraspinals (mid) Normal None None None _______ Normal Normal Normal Normal  R. First dorsal interosseous Normal None None None _______ Normal Normal Normal Normal  R. Abductor pollicis brevis Normal None None None _______ Normal Normal Normal Reduced  R. Pronator teres Normal None None None _______ Normal Normal Normal Normal  R. Biceps brachii Normal None None None _______ Normal Normal Normal Normal  R. Deltoid Normal None None None _______ Normal Normal Normal Normal  R. Triceps brachii Normal None None None _______ Normal Normal Normal Normal  R. Cervical paraspinals Normal None None None _______ Normal Normal Normal Normal  L. Abductor pollicis brevis Normal None None None _______ Normal Normal Normal Reduced

## 2023-12-04 NOTE — Telephone Encounter (Signed)
 Attempted to return pt's call. Unable to reach pt and unable to leave a message. Pt was noted to have been incidentally diagnosed with an ACOM aneurysm after an MRI that was being done for neurological symptoms. This was found after his pre-visit appointment. It appears he is still being worked up for this. Is it okay to proceed with colonoscopy tomorrow or should he be rescheduled?

## 2023-12-05 ENCOUNTER — Encounter: Admitting: Gastroenterology

## 2023-12-05 DIAGNOSIS — K080 Exfoliation of teeth due to systemic causes: Secondary | ICD-10-CM | POA: Diagnosis not present

## 2023-12-05 NOTE — Telephone Encounter (Signed)
 Thank you for letting me know.  [facility] nursing,  Please place a recall for December of this year so we can check chart and get patient back for his colonoscopy when appropriate.  Roger Wilson

## 2023-12-14 ENCOUNTER — Other Ambulatory Visit: Payer: Self-pay | Admitting: Neurosurgery

## 2023-12-14 ENCOUNTER — Other Ambulatory Visit: Payer: Self-pay

## 2023-12-14 ENCOUNTER — Ambulatory Visit (HOSPITAL_COMMUNITY)
Admission: RE | Admit: 2023-12-14 | Discharge: 2023-12-14 | Disposition: A | Discharge status: Home / Self Care | Source: Ambulatory Visit | Attending: Neurosurgery | Admitting: Neurosurgery

## 2023-12-14 DIAGNOSIS — I671 Cerebral aneurysm, nonruptured: Secondary | ICD-10-CM

## 2023-12-14 HISTORY — PX: IR ANGIO VERTEBRAL SEL VERTEBRAL UNI L MOD SED: IMG5367

## 2023-12-14 HISTORY — PX: IR ANGIO INTRA EXTRACRAN SEL INTERNAL CAROTID BILAT MOD SED: IMG5363

## 2023-12-14 LAB — CBC WITH DIFFERENTIAL/PLATELET
Abs Immature Granulocytes: 0.03 K/uL (ref 0.00–0.07)
Basophils Absolute: 0.1 K/uL (ref 0.0–0.1)
Basophils Relative: 1 %
Eosinophils Absolute: 0.4 K/uL (ref 0.0–0.5)
Eosinophils Relative: 7 %
HCT: 41.3 % (ref 39.0–52.0)
Hemoglobin: 14.3 g/dL (ref 13.0–17.0)
Immature Granulocytes: 1 %
Lymphocytes Relative: 23 %
Lymphs Abs: 1.3 K/uL (ref 0.7–4.0)
MCH: 28.7 pg (ref 26.0–34.0)
MCHC: 34.6 g/dL (ref 30.0–36.0)
MCV: 82.9 fL (ref 80.0–100.0)
Monocytes Absolute: 0.6 K/uL (ref 0.1–1.0)
Monocytes Relative: 11 %
Neutro Abs: 3.3 K/uL (ref 1.7–7.7)
Neutrophils Relative %: 57 %
Platelets: 206 K/uL (ref 150–400)
RBC: 4.98 MIL/uL (ref 4.22–5.81)
RDW: 13.2 % (ref 11.5–15.5)
WBC: 5.6 K/uL (ref 4.0–10.5)
nRBC: 0 % (ref 0.0–0.2)

## 2023-12-14 LAB — APTT: aPTT: 35 s (ref 24–36)

## 2023-12-14 LAB — PROTIME-INR
INR: 1 (ref 0.8–1.2)
Prothrombin Time: 13.4 s (ref 11.4–15.2)

## 2023-12-14 MED ORDER — LIDOCAINE HCL 1 % IJ SOLN
10.0000 mL | Freq: Once | INTRAMUSCULAR | Status: AC
  Administered 2023-12-14: 10 mL via INTRADERMAL

## 2023-12-14 MED ORDER — CEFAZOLIN SODIUM-DEXTROSE 2-4 GM/100ML-% IV SOLN: 2.0000 g | INTRAVENOUS | Status: DC

## 2023-12-14 MED ORDER — CHLORHEXIDINE GLUCONATE CLOTH 2 % EX PADS: 6.0000 | MEDICATED_PAD | Freq: Once | CUTANEOUS | Status: DC

## 2023-12-14 MED ORDER — MIDAZOLAM HCL 2 MG/2ML IJ SOLN
INTRAMUSCULAR | Status: AC
  Filled 2023-12-14: qty 2

## 2023-12-14 MED ORDER — FENTANYL CITRATE (PF) 100 MCG/2ML IJ SOLN
INTRAMUSCULAR | Status: AC
  Filled 2023-12-14: qty 2

## 2023-12-14 MED ORDER — MIDAZOLAM HCL 2 MG/2ML IJ SOLN
INTRAMUSCULAR | Status: AC | PRN
  Administered 2023-12-14: .5 mg via INTRAVENOUS

## 2023-12-14 MED ORDER — FENTANYL CITRATE (PF) 100 MCG/2ML IJ SOLN
INTRAMUSCULAR | Status: AC | PRN
  Administered 2023-12-14: 25 ug via INTRAVENOUS

## 2023-12-14 MED ORDER — LIDOCAINE HCL 1 % IJ SOLN
INTRAMUSCULAR | Status: AC
  Filled 2023-12-14: qty 20

## 2023-12-14 MED ORDER — HEPARIN SODIUM (PORCINE) 1000 UNIT/ML IJ SOLN
INTRAMUSCULAR | Status: AC | PRN
Start: 2023-12-14 — End: 2023-12-14
  Administered 2023-12-14: 2000 [IU] via INTRAVENOUS

## 2023-12-14 MED ORDER — HYDROCODONE-ACETAMINOPHEN 5-325 MG PO TABS: 1.0000 | ORAL_TABLET | ORAL | Status: DC | PRN

## 2023-12-14 MED ORDER — IOHEXOL 300 MG/ML  SOLN
50.0000 mL | Freq: Once | INTRAMUSCULAR | Status: AC | PRN
  Administered 2023-12-14: 50 mL via INTRA_ARTERIAL

## 2023-12-14 MED ORDER — HEPARIN SODIUM (PORCINE) 1000 UNIT/ML IJ SOLN
INTRAMUSCULAR | Status: AC
  Filled 2023-12-14: qty 10

## 2023-12-14 MED ORDER — SODIUM CHLORIDE 0.9 % IV SOLN
INTRAVENOUS | Status: DC
Start: 2023-12-14 — End: 2023-12-15

## 2023-12-14 NOTE — Progress Notes (Incomplete)
 Discharge instructions reviewed with pt and wife at bedside. Denies questions or concerns. PT ambulated to the bathroom with nurse was able to void. No S/s of complications at incision site. PT was escorted from the unit via wheel chair to personal vehicle.

## 2023-12-14 NOTE — H&P (Signed)
 Chief Complaint   Aneurysm  History of Present Illness  Roger Wilson is a 68 y.o. male with a history of neuropathy, neck and back pain who underwent MRI brain with incidental discovery of an anterior communicating artery aneurysm. He therefore presents for further workup with diagnostic cerebral angiogram.  Past Medical History   Past Medical History:  Diagnosis Date   Allergy    Arthritis    Asthma    as a child only    Environmental allergies    Hyperlipidemia    Hypertension    Neuromuscular disorder (HCC)    Pericarditis    x2 30 + yrs ago     Past Surgical History   Past Surgical History:  Procedure Laterality Date   COLONOSCOPY     KNEE ARTHROSCOPY  2002   left   POLYPECTOMY      Social History   Social History   Tobacco Use   Smoking status: Never    Passive exposure: Never   Smokeless tobacco: Never  Vaping Use   Vaping status: Never Used  Substance Use Topics   Alcohol  use: Yes    Alcohol /week: 7.0 standard drinks of alcohol     Types: 7 Cans of beer per week   Drug use: No    Medications   Prior to Admission medications   Medication Sig Start Date End Date Taking? Authorizing Provider  Ascorbic Acid (VITAMIN C PO) Take by mouth daily.   Yes [provider]  Cyanocobalamin (B-12 PO) Take by mouth daily.   Yes [provider]  fish oil-omega-3 fatty acids 1000 MG capsule Take by mouth daily.   Yes [provider]  fluticasone (FLONASE) 50 MCG/ACT nasal spray Place into both nostrils daily.   Yes [provider]  Loratadine  (CLARITIN  PO) Take 10 mg by mouth daily.    Yes [provider]  rosuvastatin (CRESTOR) 10 MG tablet Take 10 mg by mouth daily.   Yes [provider]  Telmisartan-amLODIPine 40-10 MG TABS daily at 12 noon.   Yes [provider]  Zinc 50 MG TABS Zinc   Yes [provider]  aspirin 81 MG tablet Take 81 mg by mouth daily.    [provider]   Calcium Carbonate-Vitamin D  (CALCIUM + D PO) Take by mouth daily. Patient not taking: Reported on 11/30/2023    [provider]  clobetasol cream (TEMOVATE) 0.05 % 1 Application.    [provider]  DULoxetine  (CYMBALTA ) 30 MG capsule Take 1 capsule (30 mg total) by mouth daily. 12/04/23   Onita Duos, MD  hydrOXYzine (ATARAX) 10 MG tablet     [provider]  lidocaine  (LIDODERM ) 5 % Place 1 patch onto the skin daily. Remove & Discard patch within 12 hours or as directed by MD Patient not taking: Reported on 12/04/2023 11/07/21   Doretha Folks, MD  methocarbamol  (ROBAXIN ) 500 MG tablet Take 1 tablet (500 mg total) by mouth every 6 (six) hours as needed for muscle spasms. Patient not taking: Reported on 12/04/2023 03/14/14   Maurice Sharlet RAMAN, PA-C  Multiple Vitamin (MULTIVITAMIN) tablet Take 1 tablet by mouth daily. Patient not taking: Reported on 12/04/2023    [provider]  oxyCODONE -acetaminophen  (PERCOCET/ROXICET) 5-325 MG tablet Take 1 tablet by mouth every 6 (six) hours as needed for severe pain. Patient not taking: Reported on 11/30/2023 11/07/21   Doretha Folks, MD    Allergies   Allergies  Allergen Reactions   Morphine  Veins turn red after injection    Review of Systems  ROS  Neurologic Exam  Awake, alert, oriented Memory and concentration grossly intact Speech fluent, appropriate CN grossly intact Motor exam: Upper Extremities Deltoid Bicep Tricep Grip  Right 5/5 5/5 5/5 5/5  Left 5/5 5/5 5/5 5/5   Lower Extremities IP Quad PF DF EHL  Right 5/5 5/5 5/5 5/5 5/5  Left 5/5 5/5 5/5 5/5 5/5   Sensation grossly intact to LT  Impression  - 68 y.o. male with incidental discovery of Acom aneurysm on MRI done for unrelated reason  Plan  - Will proceed with diagnostic cerebral angiogram  I have reviewed the indications for the procedure as well as the details of the procedure and the expected postoperative course and recovery at  length with the patient in the office. We have also reviewed in detail the risks, benefits, and alternatives to the procedure. All questions were answered and Roger Wilson provided informed consent to proceed.  Gerldine Maizes, MD [facility]

## 2023-12-14 NOTE — Discharge Instructions (Signed)
 Femoral Site Care This sheet gives you information about how to care for yourself after your procedure. Your health care provider may also give you more specific instructions. If you have problems or questions, contact your health care provider. What can I expect after the procedure?  After the procedure, it is common to have: Bruising that usually fades within 1-2 weeks. Tenderness at the site. Follow these instructions at home: Wound care Follow instructions from your health care provider about how to take care of your insertion site. Make sure you: Wash your hands with soap and water before you change your bandage (dressing). If soap and water are not available, use hand sanitizer. Remove your dressing as told by your health care provider. In 24 hours Do not take baths, swim, or use a hot tub until your health care provider approves. You may shower 24-48 hours after the procedure or as told by your health care provider. Gently wash the site with plain soap and water. Pat the area dry with a clean towel. Do not rub the site. This may cause bleeding. Do not apply powder or lotion to the site. Keep the site clean and dry. Check your femoral site every day for signs of infection. Check for: Redness, swelling, or pain. Fluid or blood. Warmth. Pus or a bad smell. Activity For the first 2-3 days after your procedure, or as long as directed: Avoid climbing stairs as much as possible. Do not squat. Do not lift anything that is heavier than 10 lb (4.5 kg), or the limit that you are told, until your health care provider says that it is safe. For 5 days Rest as directed. Avoid sitting for a long time without moving. Get up to take short walks every 1-2 hours. Do not drive for 24 hours if you were given a medicine to help you relax (sedative). General instructions Take over-the-counter and prescription medicines only as told by your health care provider. Keep all follow-up visits as told by  your health care provider. This is important. Contact a health care provider if you have: A fever or chills. You have redness, swelling, or pain around your insertion site. Get help right away if: The catheter insertion area swells very fast. You pass out. You suddenly start to sweat or your skin gets clammy. The catheter insertion area is bleeding, and the bleeding does not stop when you hold steady pressure on the area. The area near or just beyond the catheter insertion site becomes pale, cool, tingly, or numb. These symptoms may represent a serious problem that is an emergency. Do not wait to see if the symptoms will go away. Get medical help right away. Call your local emergency services (911 in the [place] Do not drive yourself to the hospital. Summary After the procedure, it is common to have bruising that usually fades within 1-2 weeks. Check your femoral site every day for signs of infection. Do not lift anything that is heavier than 10 lb (4.5 kg), or the limit that you are told, until your health care provider says that it is safe. This information is not intended to replace advice given to you by your health care provider. Make sure you discuss any questions you have with your health care provider. Document Revised: 04/17/2017 Document Reviewed: 04/17/2017 Elsevier Patient Education  2020 [employer].

## 2023-12-21 DIAGNOSIS — I671 Cerebral aneurysm, nonruptured: Secondary | ICD-10-CM | POA: Diagnosis not present

## 2023-12-26 ENCOUNTER — Other Ambulatory Visit (HOSPITAL_COMMUNITY): Payer: Self-pay | Admitting: Neurosurgery

## 2023-12-26 ENCOUNTER — Other Ambulatory Visit: Payer: Self-pay | Admitting: Neurosurgery

## 2023-12-26 DIAGNOSIS — I671 Cerebral aneurysm, nonruptured: Secondary | ICD-10-CM

## 2024-01-02 NOTE — Progress Notes (Signed)
 Surgical Instructions   Your procedure is scheduled on Tuesday, September 23rd, 2025. Report to [facility] Main Entrance A at 8:00 A.M., then check in with the Admitting office. Any questions or running late day of surgery: call [phone]  Questions prior to your surgery date: call [phone], Monday-Friday, 8am-4pm. If you experience any cold or flu symptoms such as cough, fever, chills, shortness of breath, etc. between now and your scheduled surgery, please notify us  at the above number.     Remember:  Do not eat or drink after midnight the night before your surgery      Take these medicines the morning of surgery with A SIP OF WATER:    May take these medicines IF NEEDED: Fluticasone (Flonase)   Follow your surgeon's instructions on when to start and stop Aspirin and Ticagrelor (Brilinta).  If no instructions received, reach out to surgeon's office.   One week prior to surgery, STOP taking any Aleve, Naproxen, Ibuprofen, Motrin, Advil, Goody's, BC's, all herbal medications, fish oil, and non-prescription vitamins.                     Do NOT Smoke (Tobacco/Vaping) for 24 hours prior to your procedure.  If you use a CPAP at night, you may bring your mask/headgear for your overnight stay.   You will be asked to remove any contacts, glasses, piercing's, hearing aid's, dentures/partials prior to surgery. Please bring cases for these items if needed.    Patients discharged the day of surgery will not be allowed to drive home, and someone needs to stay with them for 24 hours.  SURGICAL WAITING ROOM VISITATION Patients may have no more than 2 support people in the waiting area - these visitors may rotate.   Pre-op nurse will coordinate an appropriate time for 1 ADULT support person, who may not rotate, to accompany patient in pre-op.  Children under the age of 48 must have an adult with them who is not the patient and must remain in the main waiting area with an  adult.  If the patient needs to stay at the hospital during part of their recovery, the visitor guidelines for inpatient rooms apply.  Please refer to the [facility] website for the visitor guidelines for any additional information.   If you received a COVID test during your pre-op visit  it is requested that you wear a mask when out in public, stay away from anyone that may not be feeling well and notify your surgeon if you develop symptoms. If you have been in contact with anyone that has tested positive in the last 10 days please notify you surgeon.      Pre-operative CHG Bathing Instructions   You can play a key role in reducing the risk of infection after surgery. Your skin needs to be as free of germs as possible. You can reduce the number of germs on your skin by washing with CHG (chlorhexidine  gluconate) soap before surgery. CHG is an antiseptic soap that kills germs and continues to kill germs even after washing.   DO NOT use if you have an allergy to chlorhexidine /CHG or antibacterial soaps. If your skin becomes reddened or irritated, stop using the CHG and notify one of our RNs at [phone].              TAKE A SHOWER THE NIGHT BEFORE SURGERY AND THE DAY OF SURGERY    Please keep in mind the following:  DO NOT shave, including  legs and underarms, 48 hours prior to surgery.   You may shave your face before/day of surgery.  Place clean sheets on your bed the night before surgery Use a clean washcloth (not used since being washed) for each shower. DO NOT sleep with pet's night before surgery.  CHG Shower Instructions:  Wash your face and private area with normal soap. If you choose to wash your hair, wash first with your normal shampoo.  After you use shampoo/soap, rinse your hair and body thoroughly to remove shampoo/soap residue.  Turn the water OFF and apply half the bottle of CHG soap to a CLEAN washcloth.  Apply CHG soap ONLY FROM YOUR NECK DOWN TO YOUR TOES (washing  for 3-5 minutes)  DO NOT use CHG soap on face, private areas, open wounds, or sores.  Pay special attention to the area where your surgery is being performed.  If you are having back surgery, having someone wash your back for you may be helpful. Wait 2 minutes after CHG soap is applied, then you may rinse off the CHG soap.  Pat dry with a clean towel  Put on clean pajamas    Additional instructions for the day of surgery: DO NOT APPLY any lotions, deodorants, cologne, or perfumes.   Do not wear jewelry or makeup Do not wear nail polish, gel polish, artificial nails, or any other type of covering on natural nails (fingers and toes) Do not bring valuables to the hospital. [facility] is not responsible for valuables/personal belongings. Put on clean/comfortable clothes.  Please brush your teeth.  Ask your nurse before applying any prescription medications to the skin.

## 2024-01-03 ENCOUNTER — Encounter (HOSPITAL_COMMUNITY)
Admission: RE | Admit: 2024-01-03 | Discharge: 2024-01-03 | Disposition: A | Discharge status: Home / Self Care | Source: Ambulatory Visit | Attending: Neurosurgery | Admitting: Neurosurgery

## 2024-01-03 ENCOUNTER — Other Ambulatory Visit: Payer: Self-pay

## 2024-01-03 ENCOUNTER — Encounter (HOSPITAL_COMMUNITY): Payer: Self-pay

## 2024-01-03 VITALS — BP 141/80 | HR 62 | Temp 97.6°F | Resp 18 | Ht 70.0 in | Wt 169.0 lb

## 2024-01-03 DIAGNOSIS — Z01812 Encounter for preprocedural laboratory examination: Secondary | ICD-10-CM | POA: Diagnosis not present

## 2024-01-03 DIAGNOSIS — Z01818 Encounter for other preprocedural examination: Secondary | ICD-10-CM

## 2024-01-03 HISTORY — DX: Atherosclerotic heart disease of native coronary artery without angina pectoris: I25.10

## 2024-01-03 LAB — CBC WITH DIFFERENTIAL/PLATELET
Abs Immature Granulocytes: 0.03 K/uL (ref 0.00–0.07)
Basophils Absolute: 0 K/uL (ref 0.0–0.1)
Basophils Relative: 1 %
Eosinophils Absolute: 0.3 K/uL (ref 0.0–0.5)
Eosinophils Relative: 6 %
HCT: 48.2 % (ref 39.0–52.0)
Hemoglobin: 16.4 g/dL (ref 13.0–17.0)
Immature Granulocytes: 1 %
Lymphocytes Relative: 25 %
Lymphs Abs: 1.5 K/uL (ref 0.7–4.0)
MCH: 29.1 pg (ref 26.0–34.0)
MCHC: 34 g/dL (ref 30.0–36.0)
MCV: 85.6 fL (ref 80.0–100.0)
Monocytes Absolute: 0.5 K/uL (ref 0.1–1.0)
Monocytes Relative: 9 %
Neutro Abs: 3.4 K/uL (ref 1.7–7.7)
Neutrophils Relative %: 58 %
Platelets: 217 K/uL (ref 150–400)
RBC: 5.63 MIL/uL (ref 4.22–5.81)
RDW: 13.1 % (ref 11.5–15.5)
WBC: 5.8 K/uL (ref 4.0–10.5)
nRBC: 0 % (ref 0.0–0.2)

## 2024-01-03 LAB — URINALYSIS, ROUTINE W REFLEX MICROSCOPIC
Bilirubin Urine: NEGATIVE
Glucose, UA: NEGATIVE mg/dL
Hgb urine dipstick: NEGATIVE
Ketones, ur: NEGATIVE mg/dL
Leukocytes,Ua: NEGATIVE
Nitrite: NEGATIVE
Protein, ur: NEGATIVE mg/dL
Specific Gravity, Urine: 1.008 (ref 1.005–1.030)
pH: 6 (ref 5.0–8.0)

## 2024-01-03 LAB — BASIC METABOLIC PANEL WITH GFR
Anion gap: 14 (ref 5–15)
BUN: 13 mg/dL (ref 8–23)
CO2: 25 mmol/L (ref 22–32)
Calcium: 9.3 mg/dL (ref 8.9–10.3)
Chloride: 99 mmol/L (ref 98–111)
Creatinine, Ser: 0.87 mg/dL (ref 0.61–1.24)
GFR, Estimated: >60 mL/min (ref >60)
Glucose, Bld: 93 mg/dL (ref 70–99)
Potassium: 4 mmol/L (ref 3.5–5.1)
Sodium: 138 mmol/L (ref 135–145)

## 2024-01-03 LAB — PROTIME-INR
INR: 1 (ref 0.8–1.2)
Prothrombin Time: 13.6 s (ref 11.4–15.2)

## 2024-01-03 LAB — APTT: aPTT: 31 s (ref 24–36)

## 2024-01-04 ENCOUNTER — Encounter (HOSPITAL_COMMUNITY): Payer: Self-pay

## 2024-01-04 NOTE — Progress Notes (Signed)
 PCP - Ravisankar Avva,MD Cardiologist - denies  PPM/ICD - denies Device Orders -  Rep Notified -   Chest x-ray - na EKG - 01/03/24 Stress Test - denies ECHO - denies Cardiac Cath - denies  Sleep Study - denies CPAP - no  Fasting Blood Sugar - na Checks Blood Sugar _____ times a day  Last dose of GLP1 agonist-  na GLP1 instructions:   Blood Thinner Instructions:start Brilinta 7 days prior to procedure per Dr. Valeda' instructions. Pt states his first dose was 9/16. He says he will call Dr. Valeda office for instructions on taking Brilinta the DOS.  Aspirin Instructions:start Aspirin 7 days prior to procedure per Dr. Valeda' instructions. Pt states his first dose was 9/16. He says he will call Dr. Valeda office for instructions on taking Aspirin the DOS.  ERAS Protcol -no PRE-SURGERY Ensure or G2-   COVID TEST- na   Anesthesia review: no  Patient denies shortness of breath, fever, cough and chest pain at PAT appointment   All instructions explained to the patient, with a verbal understanding of the material. Patient agrees to go over the instructions while at home for a better understanding. Patient also instructed to self quarantine after being tested for COVID-19. The opportunity to ask questions was provided.

## 2024-01-05 NOTE — Addendum Note (Signed)
 Encounter addended by: Lanis Pupa, MD on: 01/05/2024 12:02 PM  Actions taken: Clinical Note Signed

## 2024-01-05 NOTE — Op Note (Signed)
 ENDOVASCULAR NEUROSURGERY OPERATIVE NOTE   PROCEDURE: Diagnostic Cerebral Angiogram   SURGEON:   Dr. Gerldine Maizes, MD  HISTORY:   The patient is a 68 y.o. yo male with a history of neuropathy, neck, and back pain who underwent MRI of the brain with incidental discovery of an anterior communicating artery aneurysm.  Patient therefore presents for diagnostic cerebral angiogram.  APPROACH:   The technical aspects of the procedure as well as its potential risks and benefits were reviewed with the patient. These risks included but were not limited bleeding, infection, allergic reaction, damage to organs/vital structures, stroke, non-diagnostic procedure, and the catastrophic outcomes of heart attack, coma, and death. With an understanding of these risks, informed consent was obtained and witnessed.    The patient was placed in the supine position on the angiography table and the skin of right groin prepped in the usual sterile fashion. The procedure was performed under local anesthesia (1%-solution of bicarbonate-bufferred Lidoacaine) and conscious sedation with Versed  and fentanyl  monitored by the in-suite nurse and myself, including non-invasive blood pressure and continuous pulse oxymetry.    Access to the right common femoral artery was obtained using standard anatomic landmarks with a micropuncture needle.  A short 5 French sheath was then placed using standard Seldinger technique.  HEPARIN :  2000 Units total.    CONTRAST AGENT:  See IR records   FLUOROSCOPY TIME:  See IR records    CATHETER(S) AND WIRE(S):    5-French JB-1 glidecatheter   0.035" glidewire    VESSELS CATHETERIZED:   Right internal carotid   Left internal carotid   Left vertebral   Right common femoral  VESSELS STUDIED:   Right internal carotid, head Left internal carotid, head Left vertebral Right femoral  PROCEDURAL NARRATIVE:   A 5-Fr JB-1 terumo glide catheter was advanced over a 0.035 glidewire  into the aortic arch. The above vessels were then sequentially catheterized and cervical/cerebral angiograms taken. After review of images, the catheter was removed without incident.    INTERPRETATION:   Right internal carotid, head:   Injection reveals the presence of a widely patent ICA which terminates as a middle cerebral artery.  The right A1 is hypoplastic and not visualized. No aneurysms, arteriovenous malformations, or high flow fistulas are visualized.  The parenchymal and venous phases are unremarkable, without visualization of the right anterior cerebral artery territory through this injection. The venous sinuses are widely patent.    Left internal carotid, head:   Injection reveals the presence of a widely patent ICA, A1, and M1 segments and their branches.  The left A1 is noted to be dominant, with bilateral A2 segments arising from the anterior communicating artery.  There is an aneurysm arising from the anterior communicating artery proper, with oblique views demonstrating a bilobed morphology.  Aneurysm measures approximately 5.7 x 4.4 mm, with an approximately 3 mm neck.  The parenchymal and venous phases are unremarkable. The venous sinuses are widely patent.    Left vertebral:   Injection reveals the presence of a widely patent vertebral artery. This leads to a widely patent basilar artery that terminates in bilateral P1. The basilar apex is normal. No aneurysms, arteriovenous malformations, or high flow fistulas are visualized. The parenchymal and venous phases are normal. The venous sinuses are patent.    Right femoral:    Normal vessel. No significant atherosclerotic disease. Arterial sheath in adequate position.   DISPOSITION:  Upon completion of the study, the femoral sheath was removed and hemostasis obtained using  a 5-Fr Exoseal closure device. Good proximal and distal lower extremity pulses were documented upon achievement of hemostasis. The procedure was well tolerated and  no early complications were observed.  The patient was transferred to the recovery area to be positioned flat in bed for 3 hours.    IMPRESSION:  1. Bi-lobed anterior communicating artery aneurysm in the setting of a dominant left A1, as described above. 2.  No other intracranial aneurysms, arteriovenous malformations, or high flow fistulas are noted.    Gerldine Maizes, MD [facility]

## 2024-01-08 ENCOUNTER — Encounter (HOSPITAL_COMMUNITY): Payer: Self-pay

## 2024-01-08 ENCOUNTER — Other Ambulatory Visit: Payer: Self-pay | Admitting: Radiology

## 2024-01-08 NOTE — Anesthesia Preprocedure Evaluation (Signed)
 Anesthesia Evaluation  Patient identified by MRN, date of birth, ID band Patient awake    Reviewed: Allergy & Precautions, NPO status , Patient's Chart, lab work & pertinent test results  History of Anesthesia Complications Negative for: history of anesthetic complications  Airway Mallampati: I  TM Distance: >3 FB Neck ROM: Full    Dental  (+) Chipped, Dental Advisory Given   Pulmonary    breath sounds clear to auscultation       Cardiovascular hypertension,  Rhythm:Regular Rate:Normal     Neuro/Psych Cerebral Aneurysm  Neuromuscular disease    GI/Hepatic   Endo/Other  neg diabetes    Renal/GU      Musculoskeletal   Abdominal   Peds  Hematology   Anesthesia Other Findings   Reproductive/Obstetrics                              Anesthesia Physical Anesthesia Plan  ASA: 3  Anesthesia Plan: General   Post-op Pain Management:    Induction:   PONV Risk Score and Plan: 1  Airway Management Planned: Oral ETT  Additional Equipment: Arterial line  Intra-op Plan:   Post-operative Plan: Extubation in OR  Informed Consent:      Dental advisory given  Plan Discussed with: CRNA and Surgeon  Anesthesia Plan Comments:          Anesthesia Quick Evaluation

## 2024-01-09 ENCOUNTER — Inpatient Hospital Stay (HOSPITAL_COMMUNITY): Payer: Self-pay

## 2024-01-09 ENCOUNTER — Inpatient Hospital Stay (HOSPITAL_COMMUNITY)
Admission: RE | Admit: 2024-01-09 | Discharge: 2024-01-10 | DRG: 027 | Disposition: A | Discharge status: Home / Self Care | Attending: Neurosurgery | Admitting: Neurosurgery

## 2024-01-09 ENCOUNTER — Other Ambulatory Visit: Payer: Self-pay

## 2024-01-09 ENCOUNTER — Inpatient Hospital Stay (HOSPITAL_COMMUNITY): Payer: Self-pay | Admitting: Physician Assistant

## 2024-01-09 ENCOUNTER — Encounter (HOSPITAL_COMMUNITY)
Admission: RE | Disposition: A | Discharge status: Home / Self Care | Payer: Self-pay | Source: Home / Self Care | Attending: Neurosurgery

## 2024-01-09 ENCOUNTER — Encounter (HOSPITAL_COMMUNITY): Payer: Self-pay

## 2024-01-09 ENCOUNTER — Inpatient Hospital Stay (HOSPITAL_COMMUNITY)
Admission: RE | Admit: 2024-01-09 | Discharge: 2024-01-09 | Disposition: A | Discharge status: Home / Self Care | Source: Ambulatory Visit | Attending: Neurosurgery | Admitting: Neurosurgery

## 2024-01-09 DIAGNOSIS — Z833 Family history of diabetes mellitus: Secondary | ICD-10-CM | POA: Diagnosis not present

## 2024-01-09 DIAGNOSIS — G8929 Other chronic pain: Secondary | ICD-10-CM | POA: Diagnosis present

## 2024-01-09 DIAGNOSIS — Z885 Allergy status to narcotic agent status: Secondary | ICD-10-CM | POA: Diagnosis not present

## 2024-01-09 DIAGNOSIS — Z7982 Long term (current) use of aspirin: Secondary | ICD-10-CM

## 2024-01-09 DIAGNOSIS — J45909 Unspecified asthma, uncomplicated: Secondary | ICD-10-CM | POA: Diagnosis not present

## 2024-01-09 DIAGNOSIS — I2510 Atherosclerotic heart disease of native coronary artery without angina pectoris: Secondary | ICD-10-CM | POA: Diagnosis not present

## 2024-01-09 DIAGNOSIS — G629 Polyneuropathy, unspecified: Secondary | ICD-10-CM | POA: Diagnosis not present

## 2024-01-09 DIAGNOSIS — I10 Essential (primary) hypertension: Secondary | ICD-10-CM

## 2024-01-09 DIAGNOSIS — I671 Cerebral aneurysm, nonruptured: Secondary | ICD-10-CM

## 2024-01-09 DIAGNOSIS — Z8249 Family history of ischemic heart disease and other diseases of the circulatory system: Secondary | ICD-10-CM

## 2024-01-09 DIAGNOSIS — Z79899 Other long term (current) drug therapy: Secondary | ICD-10-CM

## 2024-01-09 DIAGNOSIS — E785 Hyperlipidemia, unspecified: Secondary | ICD-10-CM | POA: Diagnosis not present

## 2024-01-09 HISTORY — PX: IR TRANSCATH/EMBOLIZ: IMG695

## 2024-01-09 HISTORY — PX: IR ANGIOGRAM FOLLOW UP STUDY: IMG697

## 2024-01-09 HISTORY — PX: IR NEURO EACH ADD'L AFTER BASIC UNI LEFT (MS): IMG5373

## 2024-01-09 HISTORY — PX: IR ANGIO INTRA EXTRACRAN SEL INTERNAL CAROTID UNI L MOD SED: IMG5361

## 2024-01-09 HISTORY — PX: RADIOLOGY WITH ANESTHESIA: SHX6223

## 2024-01-09 LAB — LIPID PANEL
Cholesterol: 160 mg/dL (ref 0–200)
HDL: 38 mg/dL — ABNORMAL LOW (ref >40)
LDL Cholesterol: 108 mg/dL — ABNORMAL HIGH (ref 0–99)
Total CHOL/HDL Ratio: 4.2 ratio
Triglycerides: 71 mg/dL (ref <150)
VLDL: 14 mg/dL (ref 0–40)

## 2024-01-09 LAB — BASIC METABOLIC PANEL WITH GFR
Anion gap: 10 (ref 5–15)
BUN: 7 mg/dL — ABNORMAL LOW (ref 8–23)
CO2: 24 mmol/L (ref 22–32)
Calcium: 8.4 mg/dL — ABNORMAL LOW (ref 8.9–10.3)
Chloride: 104 mmol/L (ref 98–111)
Creatinine, Ser: 0.79 mg/dL (ref 0.61–1.24)
GFR, Estimated: >60 mL/min (ref >60)
Glucose, Bld: 111 mg/dL — ABNORMAL HIGH (ref 70–99)
Potassium: 3.6 mmol/L (ref 3.5–5.1)
Sodium: 138 mmol/L (ref 135–145)

## 2024-01-09 LAB — CBC
HCT: 39.7 % (ref 39.0–52.0)
Hemoglobin: 14 g/dL (ref 13.0–17.0)
MCH: 29.3 pg (ref 26.0–34.0)
MCHC: 35.3 g/dL (ref 30.0–36.0)
MCV: 83.1 fL (ref 80.0–100.0)
Platelets: 180 K/uL (ref 150–400)
RBC: 4.78 MIL/uL (ref 4.22–5.81)
RDW: 13 % (ref 11.5–15.5)
WBC: 6.4 K/uL (ref 4.0–10.5)
nRBC: 0 % (ref 0.0–0.2)

## 2024-01-09 LAB — HIV ANTIBODY (ROUTINE TESTING W REFLEX): HIV Screen 4th Generation wRfx: NONREACTIVE

## 2024-01-09 LAB — MRSA NEXT GEN BY PCR, NASAL: MRSA by PCR Next Gen: NOT DETECTED

## 2024-01-09 SURGERY — RADIOLOGY WITH ANESTHESIA
Anesthesia: General

## 2024-01-09 MED ORDER — LACTATED RINGERS IV SOLN: INTRAVENOUS | Status: DC | PRN

## 2024-01-09 MED ORDER — CHLORHEXIDINE GLUCONATE 0.12 % MT SOLN
OROMUCOSAL | Status: AC
  Filled 2024-01-09: qty 15

## 2024-01-09 MED ORDER — PHENYLEPHRINE HCL-NACL 20-0.9 MG/250ML-% IV SOLN
INTRAVENOUS | Status: DC | PRN
  Administered 2024-01-09: 30 ug/min via INTRAVENOUS

## 2024-01-09 MED ORDER — ONDANSETRON HCL 4 MG/2ML IJ SOLN
INTRAMUSCULAR | Status: DC | PRN
  Administered 2024-01-09: 4 mg via INTRAVENOUS

## 2024-01-09 MED ORDER — ESMOLOL HCL 100 MG/10ML IV SOLN
INTRAVENOUS | Status: DC | PRN
  Administered 2024-01-09: 70 mg via INTRAVENOUS

## 2024-01-09 MED ORDER — ADULT MULTIVITAMIN W/MINERALS CH
1.0000 | ORAL_TABLET | Freq: Every morning | ORAL | Status: DC
Start: 2024-01-10 — End: 2024-01-10
  Administered 2024-01-10: 1 via ORAL
  Filled 2024-01-09: qty 1

## 2024-01-09 MED ORDER — CHLORHEXIDINE GLUCONATE CLOTH 2 % EX PADS: 6.0000 | MEDICATED_PAD | Freq: Once | CUTANEOUS | Status: DC

## 2024-01-09 MED ORDER — AMLODIPINE BESYLATE 10 MG PO TABS
10.0000 mg | ORAL_TABLET | Freq: Every day | ORAL | Status: DC
  Administered 2024-01-10: 10 mg via ORAL
  Filled 2024-01-09: qty 1

## 2024-01-09 MED ORDER — ROCURONIUM BROMIDE 10 MG/ML (PF) SYRINGE
PREFILLED_SYRINGE | INTRAVENOUS | Status: DC | PRN
  Administered 2024-01-09: 60 mg via INTRAVENOUS
  Administered 2024-01-09: 20 mg via INTRAVENOUS

## 2024-01-09 MED ORDER — PANTOPRAZOLE SODIUM 40 MG IV SOLR: 40.0000 mg | Freq: Every day | INTRAVENOUS | Status: DC

## 2024-01-09 MED ORDER — FLUTICASONE PROPIONATE 50 MCG/ACT NA SUSP: 1.0000 | Freq: Every day | NASAL | Status: DC | PRN

## 2024-01-09 MED ORDER — TELMISARTAN-AMLODIPINE 40-10 MG PO TABS: 1.0000 | ORAL_TABLET | Freq: Every morning | ORAL | Status: DC

## 2024-01-09 MED ORDER — CLEVIDIPINE BUTYRATE 0.5 MG/ML IV EMUL
INTRAVENOUS | Status: AC
  Filled 2024-01-09: qty 50

## 2024-01-09 MED ORDER — IRBESARTAN 150 MG PO TABS
150.0000 mg | ORAL_TABLET | Freq: Every day | ORAL | Status: DC
Start: 2024-01-10 — End: 2024-01-10
  Administered 2024-01-10: 150 mg via ORAL
  Filled 2024-01-09: qty 1

## 2024-01-09 MED ORDER — POLYETHYLENE GLYCOL 3350 17 G PO PACK
17.0000 g | PACK | Freq: Every day | ORAL | Status: DC | PRN
  Administered 2024-01-10: 17 g via ORAL
  Filled 2024-01-09: qty 1

## 2024-01-09 MED ORDER — PROPOFOL 10 MG/ML IV BOLUS
INTRAVENOUS | Status: DC | PRN
  Administered 2024-01-09: 150 mg via INTRAVENOUS
  Administered 2024-01-09 (×2): 50 mg via INTRAVENOUS

## 2024-01-09 MED ORDER — HEPARIN SODIUM (PORCINE) 1000 UNIT/ML IJ SOLN
INTRAMUSCULAR | Status: DC | PRN
  Administered 2024-01-09: 5000 [IU] via INTRAVENOUS

## 2024-01-09 MED ORDER — CHLORHEXIDINE GLUCONATE 0.12 % MT SOLN
15.0000 mL | OROMUCOSAL | Status: AC
Start: 2024-01-09 — End: 2024-01-09
  Administered 2024-01-09: 15 mL via OROMUCOSAL

## 2024-01-09 MED ORDER — DEXAMETHASONE SODIUM PHOSPHATE 10 MG/ML IJ SOLN
INTRAMUSCULAR | Status: DC | PRN
  Administered 2024-01-09: 4 mg via INTRAVENOUS

## 2024-01-09 MED ORDER — CHLORHEXIDINE GLUCONATE CLOTH 2 % EX PADS: 6.0000 | MEDICATED_PAD | Freq: Every day | CUTANEOUS | Status: DC

## 2024-01-09 MED ORDER — DOCUSATE SODIUM 100 MG PO CAPS: 100.0000 mg | ORAL_CAPSULE | Freq: Two times a day (BID) | ORAL | Status: DC | PRN

## 2024-01-09 MED ORDER — ACETAMINOPHEN 10 MG/ML IV SOLN: 1000.0000 mg | Freq: Once | INTRAVENOUS | Status: DC | PRN

## 2024-01-09 MED ORDER — TICAGRELOR 90 MG PO TABS
90.0000 mg | ORAL_TABLET | Freq: Two times a day (BID) | ORAL | Status: DC
  Administered 2024-01-09 – 2024-01-10 (×2): 90 mg via ORAL
  Filled 2024-01-09 (×2): qty 1

## 2024-01-09 MED ORDER — ASPIRIN 81 MG PO CHEW
81.0000 mg | CHEWABLE_TABLET | Freq: Every day | ORAL | Status: DC
  Administered 2024-01-10: 81 mg via ORAL
  Filled 2024-01-09: qty 1

## 2024-01-09 MED ORDER — ONDANSETRON HCL 4 MG/2ML IJ SOLN: 4.0000 mg | INTRAMUSCULAR | Status: DC | PRN

## 2024-01-09 MED ORDER — OXYCODONE HCL 5 MG/5ML PO SOLN: 5.0000 mg | Freq: Once | ORAL | Status: DC | PRN

## 2024-01-09 MED ORDER — SODIUM CHLORIDE 0.9 % IV SOLN: INTRAVENOUS | Status: DC

## 2024-01-09 MED ORDER — FENTANYL CITRATE (PF) 100 MCG/2ML IJ SOLN
INTRAMUSCULAR | Status: AC
  Filled 2024-01-09: qty 2

## 2024-01-09 MED ORDER — DROPERIDOL 2.5 MG/ML IJ SOLN: 0.6250 mg | Freq: Once | INTRAMUSCULAR | Status: DC | PRN

## 2024-01-09 MED ORDER — CEFAZOLIN SODIUM-DEXTROSE 2-4 GM/100ML-% IV SOLN: 2.0000 g | INTRAVENOUS | Status: DC

## 2024-01-09 MED ORDER — CEFAZOLIN SODIUM-DEXTROSE 2-4 GM/100ML-% IV SOLN
INTRAVENOUS | Status: AC
  Filled 2024-01-09: qty 100

## 2024-01-09 MED ORDER — IOHEXOL 300 MG/ML  SOLN
150.0000 mL | Freq: Once | INTRAMUSCULAR | Status: AC | PRN
Start: 2024-01-09 — End: 2024-01-09
  Administered 2024-01-09: 40 mL via INTRA_ARTERIAL

## 2024-01-09 MED ORDER — PANTOPRAZOLE SODIUM 40 MG PO TBEC
40.0000 mg | DELAYED_RELEASE_TABLET | Freq: Every day | ORAL | Status: DC
  Administered 2024-01-09: 40 mg via ORAL
  Filled 2024-01-09: qty 1

## 2024-01-09 MED ORDER — SUGAMMADEX SODIUM 200 MG/2ML IV SOLN
INTRAVENOUS | Status: DC | PRN
  Administered 2024-01-09: 200 mg via INTRAVENOUS

## 2024-01-09 MED ORDER — OXYCODONE HCL 5 MG PO TABS: 5.0000 mg | ORAL_TABLET | Freq: Once | ORAL | Status: DC | PRN

## 2024-01-09 MED ORDER — FENTANYL CITRATE (PF) 250 MCG/5ML IJ SOLN
INTRAMUSCULAR | Status: DC | PRN
  Administered 2024-01-09: 100 ug via INTRAVENOUS

## 2024-01-09 MED ORDER — HYDROCODONE-ACETAMINOPHEN 5-325 MG PO TABS: 1.0000 | ORAL_TABLET | ORAL | Status: DC | PRN

## 2024-01-09 MED ORDER — LABETALOL HCL 5 MG/ML IV SOLN
10.0000 mg | INTRAVENOUS | Status: DC | PRN
  Administered 2024-01-09: 10 mg via INTRAVENOUS
  Administered 2024-01-09: 20 mg via INTRAVENOUS
  Filled 2024-01-09 (×2): qty 4

## 2024-01-09 MED ORDER — ONDANSETRON HCL 4 MG PO TABS: 4.0000 mg | ORAL_TABLET | ORAL | Status: DC | PRN

## 2024-01-09 MED ORDER — LIDOCAINE 2% (20 MG/ML) 5 ML SYRINGE
INTRAMUSCULAR | Status: DC | PRN
  Administered 2024-01-09: 100 mg via INTRAVENOUS

## 2024-01-09 MED ORDER — ONDANSETRON HCL 4 MG/2ML IJ SOLN: 4.0000 mg | Freq: Once | INTRAMUSCULAR | Status: DC | PRN

## 2024-01-09 MED ORDER — FENTANYL CITRATE (PF) 100 MCG/2ML IJ SOLN
25.0000 ug | INTRAMUSCULAR | Status: DC | PRN
  Administered 2024-01-09: 25 ug via INTRAVENOUS

## 2024-01-09 MED ORDER — EPHEDRINE SULFATE-NACL 50-0.9 MG/10ML-% IV SOSY
PREFILLED_SYRINGE | INTRAVENOUS | Status: DC | PRN
  Administered 2024-01-09: 10 mg via INTRAVENOUS

## 2024-01-09 NOTE — H&P (Signed)
 Chief Complaint   Aneurysm  History of Present Illness  Roger Wilson is a 67 y.o. male with a history of neuropathy, neck and back pain who underwent MRI brain with incidental discovery of an anterior communicating artery aneurysm. He underwent diagnostic angiogram and presents today for Pipeline embolization. He has been pre-treated with ASA/Brilinta  including a dose this am.  Past Medical History   Past Medical History:  Diagnosis Date   Allergy    Arthritis    Asthma    as a child only    Coronary artery disease    Environmental allergies    Hyperlipidemia    Hypertension    Neuromuscular disorder (HCC)    Pericarditis    x2 30 + yrs ago     Past Surgical History   Past Surgical History:  Procedure Laterality Date   COLONOSCOPY     IR ANGIO INTRA EXTRACRAN SEL INTERNAL CAROTID BILAT MOD SED  12/14/2023   IR ANGIO VERTEBRAL SEL VERTEBRAL UNI L MOD SED  12/14/2023   KNEE ARTHROSCOPY  2002   left   POLYPECTOMY      Social History   Social History   Tobacco Use   Smoking status: Never    Passive exposure: Never   Smokeless tobacco: Never  Vaping Use   Vaping status: Never Used  Substance Use Topics   Alcohol  use: Yes    Alcohol /week: 7.0 standard drinks of alcohol     Types: 7 Cans of beer per week   Drug use: No    Medications   Prior to Admission medications   Medication Sig Start Date End Date Taking? Authorizing Provider  Ascorbic Acid (VITAMIN C PO) Take 500 mg by mouth in the morning.   Yes [provider]  Cholecalciferol (VITAMIN D3) 50 MCG (2000 UT) TABS Take 2,000 Units by mouth in the morning.   Yes [provider]  clobetasol cream (TEMOVATE) 0.05 % Apply 1 Application topically 2 (two) times daily as needed (skin irritation.).   Yes [provider]  Cyanocobalamin (B-12 PO) Take 1 tablet by mouth daily.   Yes [provider]  fluticasone  (FLONASE ) 50 MCG/ACT nasal spray Place 1 spray into both nostrils  daily as needed for allergies.   Yes [provider]  Glucosamine HCl 1500 MG TABS Take 1,500 mg by mouth in the morning.   Yes [provider]  hydrOXYzine (ATARAX) 10 MG tablet Take 10 mg by mouth daily as needed (itching/eczema).   Yes [provider]  ibuprofen (ADVIL) 200 MG tablet Take 800 mg by mouth at bedtime as needed (muscle pain/numbness/tightness).   Yes [provider]  Multiple Vitamin (MULTIVITAMIN WITH MINERALS) TABS tablet Take 1 tablet by mouth in the morning.   Yes [provider]  Telmisartan -amLODIPine  40-10 MG TABS Take 1 tablet by mouth in the morning.   Yes [provider]  Zinc 50 MG TABS Take 50 mg by mouth in the morning.   Yes [provider]  aspirin  81 MG chewable tablet Chew 81 mg by mouth daily.    [provider]  DULoxetine  (CYMBALTA ) 30 MG capsule Take 1 capsule (30 mg total) by mouth daily. Patient not taking: Reported on 01/01/2024 12/04/23   Onita Duos, MD  ticagrelor  (BRILINTA ) 90 MG TABS tablet Take 90 mg by mouth 2 (two) times daily.    [provider]    Allergies   Allergies  Allergen Reactions   Morphine     Veins  turn red after injection (more than 30 years since reaction)    Review of Systems  ROS  Neurologic Exam  Awake, alert, oriented Memory and concentration grossly intact Speech fluent, appropriate CN grossly intact Motor exam: Upper Extremities Deltoid Bicep Tricep Grip  Right 5/5 5/5 5/5 5/5  Left 5/5 5/5 5/5 5/5   Lower Extremities IP Quad PF DF EHL  Right 5/5 5/5 5/5 5/5 5/5  Left 5/5 5/5 5/5 5/5 5/5   Sensation grossly intact to LT  Impression  - 68 y.o. male with incidental discovery of Acom aneurysm  Plan  - Will proceed with Pipeline embolization of Acom aneurysm  I have reviewed the indications for the procedure as well as the details of the procedure and the expected postoperative course and recovery at length with the patient in  the office. We have also reviewed in detail the risks, benefits, and alternatives to the procedure. All questions were answered and Norleen CHRISTELLA Edison provided informed consent to proceed.  Gerldine Maizes, MD [facility]

## 2024-01-09 NOTE — Anesthesia Procedure Notes (Signed)
 Procedure Name: Intubation Date/Time: 01/09/2024 10:40 AM  Performed by: Mannie Krystal LABOR, CRNAPre-anesthesia Checklist: Patient identified, Emergency Drugs available, Suction available and Patient being monitored Patient Re-evaluated:Patient Re-evaluated prior to induction Oxygen Delivery Method: Circle system utilized Preoxygenation: Pre-oxygenation with 100% oxygen Induction Type: IV induction Ventilation: Mask ventilation without difficulty Laryngoscope Size: Mac and 4 Grade View: Grade II Tube type: Oral Tube size: 7.0 mm Number of attempts: 1 Airway Equipment and Method: Stylet and Oral airway Placement Confirmation: ETT inserted through vocal cords under direct vision, positive ETCO2 and breath sounds checked- equal and bilateral Secured at: 22 cm Tube secured with: Tape Dental Injury: Teeth and Oropharynx as per pre-operative assessment

## 2024-01-09 NOTE — Consult Note (Signed)
 NAME:  Roger Wilson, MRN:  [id number], DOB:  06/25/1955, LOS: 0 ADMISSION DATE:  01/09/2024, CONSULTATION DATE:  9/23 REFERRING MD:  Lanis LOOSE CHIEF COMPLAINT:  s/p aneurysm embolization    History of Present Illness:  68 year old male with past medical history of asthma, hyperlipidemia, hypertension, CAD, neuropathy, neck and back pain. Admitted for pipeline embolization. He had brain MRI and c-spine (unclear time) with regard to neck and back pain and incidentally found to have anterior communicating artery aneurysm. He had diagnostic angiogram 8/28 with Dr. Lanis. He was started on ASA/Brilinta . Today is admitted to ICU s/p ACOM embolization.   Pertinent  Medical History  asthma, hyperlipidemia, hypertension, CAD, neuropathy, neck and back pain  Significant Hospital Events: Including procedures, antibiotic start and stop dates in addition to other pertinent events   9/23: ACOM pipeline embolization   Interim History / Subjective:  Admit ICU s/p pipeline embolization. Pain in left thigh. Had post-operative small hematoma. Not expanding at current. No headache.   Objective   There were no vitals taken for this visit.        Intake/Output Summary (Last 24 hours) at 01/09/2024 1149 Last data filed at 01/09/2024 1145 Gross per 24 hour  Intake 1000 ml  Output 20 ml  Net 980 ml   There were no vitals filed for this visit.  Examination: General: middle aged-older male laying in bed, no acute distress HENT: perrla, mmm, ncat  Lungs: CTAB, room air, resp even and unlabored Cardiovascular: s1s2, no murmur Abdomen: rounded, soft  Extremities: IR dressing R groin Neuro: awake, alert, oriented, non focal  GU: no foley    Resolved Hospital Problem list     Assessment & Plan:  Anterior communicating artery aneurysm s/p pipeline embolization  Pretreated with asa/brilinta .  - nsgy primary, appreciate management  - asa 81mg  daily, brilinta  90mg  daily  - labetalol  PRN for SBP  > 160/105 - neuro checks  - neuro protective measures  - pain control with norco  - nausea control with zofran    Hypertension  Hyperlipidemia  On telmisartan -amlodipine  40-10mg  at home, not on statin  - check lipid panel to eval for med need - NOTE, patient with history of statin-intolerance (myopathy, weakness) - continue home irbesartan -amlodipine  150mg -10mg   - prn labetalol    CAD  Appears to have seen [facility] early 2000s  - tele monitoring   Asthma  - no home inhalers  - monitor clinically   Chronic neck and back pain  - norco as above  Labs   CBC: Recent Labs  Lab 01/03/24 1135  WBC 5.8  NEUTROABS 3.4  HGB 16.4  HCT 48.2  MCV 85.6  PLT 217    Basic Metabolic Panel: Recent Labs  Lab 01/03/24 1135  NA 138  K 4.0  CL 99  CO2 25  GLUCOSE 93  BUN 13  CREATININE 0.87  CALCIUM 9.3   GFR: Estimated Creatinine Clearance: 81.6 mL/min (by C-G formula based on SCr of 0.87 mg/dL). Recent Labs  Lab 01/03/24 1135  WBC 5.8    Liver Function Tests: No results for input(s): AST, ALT, ALKPHOS, BILITOT, PROT, ALBUMIN in the last 168 hours. No results for input(s): LIPASE, AMYLASE in the last 168 hours. No results for input(s): AMMONIA in the last 168 hours.  ABG    Component Value Date/Time   TCO2 28 03/04/2014 1330     Coagulation Profile: Recent Labs  Lab 01/03/24 1135  INR 1.0    Cardiac Enzymes: No results for  input(s): CKTOTAL, CKMB, CKMBINDEX, TROPONINI in the last 168 hours.  HbA1C: Hgb A1c MFr Bld  Date/Time Value Ref Range Status  11/30/2023 10:16 AM 5.4 4.8 - 5.6 % Final    Comment:             Prediabetes: 5.7 - 6.4          Diabetes: >6.4          Glycemic control for adults with diabetes: <7.0     CBG: No results for input(s): GLUCAP in the last 168 hours.  Review of Systems:   As above  Past Medical History:  He,  has a past medical history of Allergy, Arthritis, Asthma, Coronary  artery disease, Environmental allergies, Hyperlipidemia, Hypertension, Neuromuscular disorder (HCC), and Pericarditis.   Surgical History:   Past Surgical History:  Procedure Laterality Date   COLONOSCOPY     IR ANGIO INTRA EXTRACRAN SEL INTERNAL CAROTID BILAT MOD SED  12/14/2023   IR ANGIO VERTEBRAL SEL VERTEBRAL UNI L MOD SED  12/14/2023   KNEE ARTHROSCOPY  2002   left   POLYPECTOMY       Social History:   reports that he has never smoked. He has never been exposed to tobacco smoke. He has never used smokeless tobacco. He reports current alcohol  use of about 7.0 standard drinks of alcohol  per week. He reports that he does not use drugs.   Family History:  His family history includes Diabetes in his mother; Hypertension in his mother. There is no history of Colon cancer, Colon polyps, Esophageal cancer, Stomach cancer, or Rectal cancer.   Allergies Allergies  Allergen Reactions   Morphine     Veins turn red after injection (more than 30 years since reaction)     Home Medications  Prior to Admission medications   Medication Sig Start Date End Date Taking? Authorizing Provider  Ascorbic Acid (VITAMIN C PO) Take 500 mg by mouth in the morning.   Yes [provider]  Cholecalciferol (VITAMIN D3) 50 MCG (2000 UT) TABS Take 2,000 Units by mouth in the morning.   Yes [provider]  clobetasol cream (TEMOVATE) 0.05 % Apply 1 Application topically 2 (two) times daily as needed (skin irritation.).   Yes [provider]  Cyanocobalamin (B-12 PO) Take 1 tablet by mouth daily.   Yes [provider]  fluticasone  (FLONASE ) 50 MCG/ACT nasal spray Place 1 spray into both nostrils daily as needed for allergies.   Yes [provider]  Glucosamine HCl 1500 MG TABS Take 1,500 mg by mouth in the morning.   Yes [provider]  hydrOXYzine (ATARAX) 10 MG tablet Take 10 mg by mouth daily as needed (itching/eczema).   Yes [provider]   ibuprofen (ADVIL) 200 MG tablet Take 800 mg by mouth at bedtime as needed (muscle pain/numbness/tightness).   Yes [provider]  Multiple Vitamin (MULTIVITAMIN WITH MINERALS) TABS tablet Take 1 tablet by mouth in the morning.   Yes [provider]  Telmisartan -amLODIPine  40-10 MG TABS Take 1 tablet by mouth in the morning.   Yes [provider]  Zinc 50 MG TABS Take 50 mg by mouth in the morning.   Yes [provider]  aspirin  81 MG chewable tablet Chew 81 mg by mouth daily.    [provider]  DULoxetine  (CYMBALTA ) 30 MG capsule Take 1 capsule (30 mg total) by mouth daily. Patient not taking: Reported on 01/01/2024 12/04/23   Onita Duos, MD  ticagrelor  (BRILINTA )  90 MG TABS tablet Take 90 mg by mouth 2 (two) times daily.    [provider]     Critical care time: 48    Tinnie FORBES Adolph [facility]ONNA [facility] 01/09/24 11:53 AM  Please see [email or web address] for pager details.  From 7A-7P if no response, please call [phone] After hours, please call ELink [phone]

## 2024-01-09 NOTE — Progress Notes (Signed)
 Hand off given to Krystal Gravely CRNA.

## 2024-01-09 NOTE — Op Note (Signed)
 ENDOVASCULAR NEUROSURGERY OPERATIVE NOTE   PROCEDURE: Pipeline embolization of Left anterior cerebral artery aneurysm  OPERATOR:    Dr. Gerldine Maizes, MD  HISTORY:    The patient is a 68 y.o. male with an incidentally discovered anterior cerebral artery aneurysm who underwent diagnostic cerebral angiogram several weeks ago.  After lengthy discussion of treatment options, the patient elected to proceed with pipeline embolization.  He has been pretreated with aspirin  and Brilinta .  APPROACH:    The technical aspects of the procedure as well as its potential risks and benefits were reviewed with the patient. These risks included but were not limited bleeding, infection, allergic reaction, damage to organs/vital structures, stroke, non-diagnostic procedure, and the catastrophic outcomes of heart attack, coma, and death. With an understanding of these risks, informed consent was obtained and witnessed.    The patient was placed in the supine position on the angiography table and the skin of right groin prepped in the usual sterile fashion. The procedure was performed under general anesthesia.  A 8- French sheath was introduced in the right common femoral artery using Seldinger technique.  A fluorophase sequence was used to document the sheath position.    HEPARIN :  5000 Units total.     CONTRAST AGENT:  See IR records    FLUOROSCOPY TIME:  See IR records   CATHETER(S) AND WIRE(S):     0.035" glidewire   90 cm 6 [place] NeuronMax guide sheath 125 cm 6 [place] Berenstein select guide catheter 120cm phenom plus microcatheter 150 cm Phenom 21 microcatheter Synchro 2 select standard microwire  VESSELS CATHETERIZED:    Left internal carotid artery Left anterior cerebral artery Right common femoral  VESSELS STUDIED:    Left internal carotid artery, pre-embolization Left internal carotid artery, post embolization Left internal carotid artery, final control  PIPELINE DEVICE USED  (MRI COMPATIBLE): Pipeline vantage with shield 2.5 x 18 mm  PROCEDURAL NARRATIVE:   The guide sheath was introduced over the select guide catheter and 035 wire. The left common and internal carotid artery was then selected, and the guide sheath was advanced into the proximal cervical left internal carotid artery. The select catheter was then removed without incident. The phenom plus catheter was then introduced over the phenom 21 microcatheter and Synchro standard microwire. The microcatheter was used to select the distal segments of the left middle cerebral artery, and the guide catheter was then advanced to its final position in the supraclinoid left internal carotid. The microcatheter was then advanced into the contralateral right A3 segment. Microwire was removed and the above Pipeline device introduced.  I then initially deployed the device approximately two thirds of the way, and noted that the device was likely positioned slightly more distal than ideal.  The device was then recaptured, and deployed again slightly more proximally across the aneurysm, into the mid A1 segment.  Angiogram was then taken.  The pusher wire and 21 microcatheter were then removed.  Delayed angiogram was then taken.  The microcatheter was then removed, and sheath was withdrawn into the proximal cervical internal carotid artery, and final control angiogram was taken. The entire catheter construct was then removed synchronously without incident.  INTERPRETATION:    Left internal carotid artery, pre-embolization: Injection demonstrates patency of the internal carotid artery, with normal bifurcation into anterior and middle cerebral arteries. The previously described anterior communicating artery aneurysm is again identified at the bifurcation of the dominant left A1 into bilateral A2 segments. Capillary phases unremarkable. Venous phases normal.  Left  internal carotid, post-embolization: Post-deployment angiograms taken  immediately and a delayed fashion reveal widely patent carotid artery. Pipeline device is widely patent with stable position extending from the mid A1 into the proximal contralateral right A2 segment.  There is good vessel wall apposition. No filling defects are seen. There remains mild stasis within the aneurysm.  Left sternal carotid artery, final control: Injection demonstrates patency of the internal carotid artery, with normal bifurcation. The distal branches of the anterior cerebral and middle cerebral arteries are normal. Pipeline device is stable, without any filling defects seen. There remains mild stasis within the aneurysm. Capillary phase does not demonstrate any perfusion deficits or branch occlusions. Venous sinuses remain widely patent.  Right femoral:     Normal vessel. No significant atherosclerotic disease. Arterial sheath in adequate position.   DISPOSITION:   Upon completion of the study, the femoral sheath was removed and hemostasis obtained using a 8-Fr Angio-Seal closure device. Good proximal and distal lower extremity pulses were documented upon achievement of hemostasis. The procedure was well tolerated and no early complications were observed.   The patient was extubated and taken to the postanesthesia care unit in stable hemodynamic condition.  IMPRESSION:   1. Successful Pipeline embolization of a left anterior cerebral artery aneurysm without local thrombotic or distal embolic complication.   Gerldine Maizes, MD [facility]

## 2024-01-09 NOTE — Anesthesia Postprocedure Evaluation (Addendum)
 Anesthesia Post Note  Patient: Roger Wilson  Procedure(s) Performed: RADIOLOGY WITH ANESTHESIA     Patient location during evaluation: PACU Anesthesia Type: General Level of consciousness: awake Pain management: pain level controlled Vital Signs Assessment: post-procedure vital signs reviewed and stable Respiratory status: spontaneous breathing Cardiovascular status: blood pressure returned to baseline Postop Assessment: no headache Anesthetic complications: no   No notable events documented.  LITTIE        Lauraine KATHEE [place]

## 2024-01-09 NOTE — Anesthesia Procedure Notes (Signed)
 Arterial Line Insertion Start/End9/23/2025 9:30 AM, 01/09/2024 9:35 AM Performed by: Waddell Lauraine NOVAK, MD, Mannie Krystal LABOR, CRNA, CRNA  Patient location: Pre-op. Preanesthetic checklist: patient identified, IV checked, site marked, risks and benefits discussed, surgical consent, monitors and equipment checked, pre-op evaluation, timeout performed and anesthesia consent Lidocaine  1% used for infiltration Left, radial was placed Catheter size: 20 G Hand hygiene performed , maximum sterile barriers used  and Seldinger technique used Allen's test indicative of satisfactory collateral circulation Attempts: 1 Procedure performed using ultrasound guided technique. Ultrasound Notes:anatomy identified, needle tip was noted to be adjacent to the nerve/plexus identified and no ultrasound evidence of intravascular and/or intraneural injection Following insertion, Biopatch. Post procedure assessment: normal  Patient tolerated the procedure well with no immediate complications.

## 2024-01-09 NOTE — Transfer of Care (Signed)
 Immediate Anesthesia Transfer of Care Note  Patient: Roger Wilson  Procedure(s) Performed: RADIOLOGY WITH ANESTHESIA  Patient Location: PACU  Anesthesia Type:General  Level of Consciousness: awake, alert , and oriented  Airway & Oxygen Therapy: Patient Spontanous Breathing and Patient connected to nasal cannula oxygen  Post-op Assessment: Report given to RN and Post -op Vital signs reviewed and stable  Post vital signs: Reviewed and stable  Last Vitals:  Vitals Value Taken Time  BP 121/72 01/09/24 12:00  Temp 36.6 C 01/09/24 12:00  Pulse 70 01/09/24 12:06  Resp 16 01/09/24 12:06  SpO2 97 % 01/09/24 12:06  Vitals shown include unfiled device data.  Last Pain:  Vitals:   01/09/24 1200  PainSc: 0-No pain         Complications: No notable events documented.

## 2024-01-10 ENCOUNTER — Encounter (HOSPITAL_COMMUNITY): Payer: Self-pay | Admitting: Neurosurgery

## 2024-01-10 MED ORDER — PHENOL 1.4 % MT LIQD
1.0000 | OROMUCOSAL | Status: DC | PRN
  Administered 2024-01-10: 1 via OROMUCOSAL
  Filled 2024-01-10: qty 177

## 2024-01-10 MED ORDER — POTASSIUM CHLORIDE CRYS ER 20 MEQ PO TBCR
40.0000 meq | EXTENDED_RELEASE_TABLET | Freq: Once | ORAL | Status: AC
  Administered 2024-01-10: 40 meq via ORAL
  Filled 2024-01-10: qty 2

## 2024-01-10 MED ORDER — ACETAMINOPHEN 325 MG PO TABS
650.0000 mg | ORAL_TABLET | ORAL | Status: DC | PRN
  Administered 2024-01-10: 650 mg via ORAL
  Filled 2024-01-10: qty 2

## 2024-01-10 MED ORDER — POLYETHYLENE GLYCOL 3350 17 G PO PACK: 17.0000 g | PACK | Freq: Every day | ORAL | Status: DC

## 2024-01-10 MED ORDER — ALUM & MAG HYDROXIDE-SIMETH 200-200-20 MG/5ML PO SUSP
30.0000 mL | Freq: Four times a day (QID) | ORAL | Status: DC | PRN
  Administered 2024-01-10: 30 mL via ORAL
  Filled 2024-01-10: qty 30

## 2024-01-10 NOTE — Progress Notes (Signed)
 Pt d/c home via family transport. Exam and vitals stable at discharge. EVS instructions reviewed and verbalized understanding.   01/10/2024 Charletta Bathe, RN, BSN 10:02 AM

## 2024-01-10 NOTE — Progress Notes (Signed)
 NAME:  Roger Wilson, MRN:  [id number], DOB:  1956-01-15, LOS: 1 ADMISSION DATE:  01/09/2024, CONSULTATION DATE:  9/23 REFERRING MD:  Lanis LOOSE CHIEF COMPLAINT:  s/p aneurysm embolization    History of Present Illness:  68 year old male with past medical history of asthma, hyperlipidemia, hypertension, CAD, neuropathy, neck and back pain. Admitted for pipeline embolization. He had brain MRI and c-spine (unclear time) with regard to neck and back pain and incidentally found to have anterior communicating artery aneurysm. He had diagnostic angiogram 8/28 with Dr. Lanis. He was started on ASA/Brilinta . Today is admitted to ICU s/p ACOM embolization.   Pertinent  Medical History  asthma, hyperlipidemia, hypertension, CAD, neuropathy, neck and back pain  Significant Hospital Events: Including procedures, antibiotic start and stop dates in addition to other pertinent events   9/23: ACOM pipeline embolization  9/24: discharge today  Interim History / Subjective:  Feels good this morning - to be discharged.   Objective   Blood pressure 119/77, pulse (!) 59, temperature 98.7 F (37.1 C), temperature source Oral, resp. rate 19, SpO2 93%.        Intake/Output Summary (Last 24 hours) at 01/10/2024 0753 Last data filed at 01/10/2024 0719 Gross per 24 hour  Intake 2162.48 ml  Output 1195 ml  Net 967.48 ml   There were no vitals filed for this visit.  Examination: General: middle aged-older male laying in bed, no acute distress HENT: perrla, mmm, ncat  Lungs: CTAB, room air, resp even and unlabored Cardiovascular: s1s2, no murmur Abdomen: rounded, soft  Extremities: IR dressing R groin - stable, no strikethrough or expanding hematoma underneath  Neuro: awake, alert, oriented, non focal  GU: no foley    Resolved Hospital Problem list     Assessment & Plan:  Anterior communicating artery aneurysm s/p pipeline embolization  Pretreated with asa/brilinta .  - nsgy primary,  appreciate management  - discharge today  - remove a-line - asa 81mg  daily, brilinta  90mg  daily  - labetalol  PRN for SBP > 160/105 - neuro checks  - neuro protective measures  - pain control with norco  - nausea control with zofran    Hypertension  Hyperlipidemia  On telmisartan -amlodipine  40-10mg  at home, not on statin  - PCP will have lipid panel resulted for f/u - should not be started on statin due to intolerance - continue home irbesartan -amlodipine  150mg -10mg   - prn labetalol    CAD  Appears to have seen [facility] early 2000s  - tele monitoring   Asthma  - no home inhalers  - monitor clinically   Chronic neck and back pain  - norco as above  Labs   CBC: Recent Labs  Lab 01/03/24 1135 01/09/24 1507  WBC 5.8 6.4  NEUTROABS 3.4  --   HGB 16.4 14.0  HCT 48.2 39.7  MCV 85.6 83.1  PLT 217 180    Basic Metabolic Panel: Recent Labs  Lab 01/03/24 1135 01/09/24 1507  NA 138 138  K 4.0 3.6  CL 99 104  CO2 25 24  GLUCOSE 93 111*  BUN 13 7*  CREATININE 0.87 0.79  CALCIUM 9.3 8.4*   GFR: Estimated Creatinine Clearance: 88.7 mL/min (by C-G formula based on SCr of 0.79 mg/dL). Recent Labs  Lab 01/03/24 1135 01/09/24 1507  WBC 5.8 6.4    Liver Function Tests: No results for input(s): AST, ALT, ALKPHOS, BILITOT, PROT, ALBUMIN in the last 168 hours. No results for input(s): LIPASE, AMYLASE in the last 168 hours. No results for input(s):  AMMONIA in the last 168 hours.  ABG    Component Value Date/Time   TCO2 28 03/04/2014 1330     Coagulation Profile: Recent Labs  Lab 01/03/24 1135  INR 1.0    Cardiac Enzymes: No results for input(s): CKTOTAL, CKMB, CKMBINDEX, TROPONINI in the last 168 hours.  HbA1C: Hgb A1c MFr Bld  Date/Time Value Ref Range Status  11/30/2023 10:16 AM 5.4 4.8 - 5.6 % Final    Comment:             Prediabetes: 5.7 - 6.4          Diabetes: >6.4          Glycemic control for adults with  diabetes: <7.0     CBG: No results for input(s): GLUCAP in the last 168 hours.  Review of Systems:   As above  Past Medical History:  He,  has a past medical history of Allergy, Arthritis, Asthma, Coronary artery disease, Environmental allergies, Hyperlipidemia, Hypertension, Neuromuscular disorder (HCC), and Pericarditis.   Surgical History:   Past Surgical History:  Procedure Laterality Date   COLONOSCOPY     IR ANGIO INTRA EXTRACRAN SEL INTERNAL CAROTID BILAT MOD SED  12/14/2023   IR ANGIO VERTEBRAL SEL VERTEBRAL UNI L MOD SED  12/14/2023   KNEE ARTHROSCOPY  2002   left   POLYPECTOMY       Social History:   reports that he has never smoked. He has never been exposed to tobacco smoke. He has never used smokeless tobacco. He reports current alcohol  use of about 7.0 standard drinks of alcohol  per week. He reports that he does not use drugs.   Family History:  His family history includes Diabetes in his mother; Hypertension in his mother. There is no history of Colon cancer, Colon polyps, Esophageal cancer, Stomach cancer, or Rectal cancer.   Allergies Allergies  Allergen Reactions   Morphine     Veins turn red after injection (more than 30 years since reaction)     Home Medications  Prior to Admission medications   Medication Sig Start Date End Date Taking? Authorizing Provider  Ascorbic Acid (VITAMIN C PO) Take 500 mg by mouth in the morning.   Yes [provider]  Cholecalciferol (VITAMIN D3) 50 MCG (2000 UT) TABS Take 2,000 Units by mouth in the morning.   Yes [provider]  clobetasol cream (TEMOVATE) 0.05 % Apply 1 Application topically 2 (two) times daily as needed (skin irritation.).   Yes [provider]  Cyanocobalamin (B-12 PO) Take 1 tablet by mouth daily.   Yes [provider]  fluticasone  (FLONASE ) 50 MCG/ACT nasal spray Place 1 spray into both nostrils daily as needed for allergies.   Yes [provider]   Glucosamine HCl 1500 MG TABS Take 1,500 mg by mouth in the morning.   Yes [provider]  hydrOXYzine (ATARAX) 10 MG tablet Take 10 mg by mouth daily as needed (itching/eczema).   Yes [provider]  ibuprofen (ADVIL) 200 MG tablet Take 800 mg by mouth at bedtime as needed (muscle pain/numbness/tightness).   Yes [provider]  Multiple Vitamin (MULTIVITAMIN WITH MINERALS) TABS tablet Take 1 tablet by mouth in the morning.   Yes [provider]  Telmisartan -amLODIPine  40-10 MG TABS Take 1 tablet by mouth in the morning.   Yes [provider]  Zinc 50 MG TABS Take 50 mg by mouth in the morning.   Yes [provider]  aspirin  81 MG chewable  tablet Chew 81 mg by mouth daily.    [provider]  DULoxetine  (CYMBALTA ) 30 MG capsule Take 1 capsule (30 mg total) by mouth daily. Patient not taking: Reported on 01/01/2024 12/04/23   Onita Duos, MD  ticagrelor  (BRILINTA ) 90 MG TABS tablet Take 90 mg by mouth 2 (two) times daily.    [provider]     Critical care time: na    Tinnie FORBES Adolph DEVONNA [facility] 01/10/24 7:53 AM  Please see [email or web address] for pager details.  From 7A-7P if no response, please call [phone] After hours, please call ELink [phone]

## 2024-01-10 NOTE — Progress Notes (Deleted)
 Pt's mother updated via phone per pt request. Now at bedside.

## 2024-01-10 NOTE — Plan of Care (Signed)
  Problem: Education: Goal: Knowledge of General Education information will improve Description: Including pain rating scale, medication(s)/side effects and non-pharmacologic comfort measures Outcome: Progressing   Problem: Health Behavior/Discharge Planning: Goal: Ability to manage health-related needs will improve Outcome: Progressing   Problem: Clinical Measurements: Goal: Ability to maintain clinical measurements within normal limits will improve Outcome: Progressing Goal: Will remain free from infection Outcome: Progressing Goal: Diagnostic test results will improve Outcome: Progressing Goal: Respiratory complications will improve Outcome: Progressing Goal: Cardiovascular complication will be avoided Outcome: Progressing   Problem: Activity: Goal: Risk for activity intolerance will decrease Outcome: Progressing   Problem: Nutrition: Goal: Adequate nutrition will be maintained Outcome: Progressing   Problem: Coping: Goal: Level of anxiety will decrease Outcome: Progressing   Problem: Elimination: Goal: Will not experience complications related to urinary retention Outcome: Progressing   Problem: Pain Managment: Goal: General experience of comfort will improve and/or be controlled Outcome: Progressing   Problem: Safety: Goal: Ability to remain free from injury will improve Outcome: Progressing   Problem: Skin Integrity: Goal: Risk for impaired skin integrity will decrease Outcome: Progressing   Problem: Education: Goal: Knowledge of the prescribed therapeutic regimen will improve Outcome: Progressing   Problem: Clinical Measurements: Goal: Usual level of consciousness will be regained or maintained. Outcome: Progressing Goal: Neurologic status will improve Outcome: Progressing Goal: Ability to maintain intracranial pressure will improve Outcome: Progressing

## 2024-01-10 NOTE — Discharge Summary (Signed)
  Patient ID: Roger Wilson MRN: [id number] DOB/AGE: Jun 19, 1955 68 y.o.  Admit date: 01/09/2024 Discharge date: 01/10/2024  Admission Diagnoses: Cerebral aneurysm, nonruptured [I67.1] Cerebral aneurysm [I67.1]   Discharge Diagnoses: Same   Discharged Condition: Stable  Hospital Course:  Roger Wilson is a 68 y.o. 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 They were recovered in PACU and transferred to the ICU. Hospital course was uncomplicated. Pt stable for discharge today. He will continue ASA and Brilinta . Pt to f/u in office for routine post op visit. Pt is in agreement w/ plan.    Discharge Exam: Blood pressure 119/77, pulse (!) 59, temperature 98.2 F (36.8 C), temperature source Oral, resp. rate 19, SpO2 93%. A&O Speech fluent, appropriate Dressing c/d/I.  R groin access site soft, nontender.   Disposition: Discharge disposition: 01-Home or Self Care        Allergies as of 01/10/2024       Reactions   Morphine    Veins turn red after injection (more than 30 years since reaction)        Medication List     TAKE these medications    aspirin  81 MG chewable tablet Chew 81 mg by mouth daily.   B-12 PO Take 1 tablet by mouth daily.   clobetasol cream 0.05 % Commonly known as: TEMOVATE Apply 1 Application topically 2 (two) times daily as needed (skin irritation.).   DULoxetine  30 MG capsule Commonly known as: Cymbalta  Take 1 capsule (30 mg total) by mouth daily.   fluticasone  50 MCG/ACT nasal spray Commonly known as: FLONASE  Place 1 spray into both nostrils daily as needed for allergies.   Glucosamine HCl 1500 MG Tabs Take 1,500 mg by mouth in the morning.   hydrOXYzine 10 MG tablet Commonly known as: ATARAX Take 10 mg by mouth daily as needed (itching/eczema).   ibuprofen 200 MG tablet Commonly known as: ADVIL Take 800 mg by mouth at bedtime as needed (muscle pain/numbness/tightness).    multivitamin with minerals Tabs tablet Take 1 tablet by mouth in the morning.   Telmisartan -amLODIPine  40-10 MG Tabs Take 1 tablet by mouth in the morning.   ticagrelor  90 MG Tabs tablet Commonly known as: BRILINTA  Take 90 mg by mouth 2 (two) times daily.   VITAMIN C PO Take 500 mg by mouth in the morning.   Vitamin D3 50 MCG (2000 UT) Tabs Take 2,000 Units by mouth in the morning.   Zinc 50 MG Tabs Take 50 mg by mouth in the morning.         Signed: Amand Lemoine CAYLIN Rayya Yagi 01/10/2024, 8:49 AM

## 2024-01-16 DIAGNOSIS — I10 Essential (primary) hypertension: Secondary | ICD-10-CM | POA: Diagnosis not present

## 2024-01-24 DIAGNOSIS — I671 Cerebral aneurysm, nonruptured: Secondary | ICD-10-CM | POA: Diagnosis not present

## 2024-02-14 ENCOUNTER — Encounter: Admitting: Neurology

## 2024-04-15 ENCOUNTER — Telehealth: Payer: Self-pay | Admitting: Family Medicine

## 2024-04-15 NOTE — Telephone Encounter (Signed)
 Pt called to Cancel appt due to not needing it any more

## 2024-05-16 ENCOUNTER — Other Ambulatory Visit: Payer: Self-pay | Admitting: Neurosurgery

## 2024-05-16 DIAGNOSIS — I671 Cerebral aneurysm, nonruptured: Secondary | ICD-10-CM

## 2024-06-24 ENCOUNTER — Ambulatory Visit: Admitting: Family Medicine

## 2024-07-30 ENCOUNTER — Other Ambulatory Visit (HOSPITAL_COMMUNITY)
